# Patient Record
Sex: Female | Born: 1940 | Race: White | Hispanic: No | Smoking: Former smoker
Health system: Southern US, Community
[De-identification: ages and names within clinical notes are randomized; demographics above are authoritative.]

## PROBLEM LIST (undated history)

## (undated) DIAGNOSIS — K760 Fatty (change of) liver, not elsewhere classified: Secondary | ICD-10-CM

## (undated) DIAGNOSIS — R0602 Shortness of breath: Secondary | ICD-10-CM

## (undated) DIAGNOSIS — I509 Heart failure, unspecified: Secondary | ICD-10-CM

## (undated) DIAGNOSIS — M4800 Spinal stenosis, site unspecified: Secondary | ICD-10-CM

## (undated) DIAGNOSIS — M545 Low back pain, unspecified: Secondary | ICD-10-CM

## (undated) DIAGNOSIS — J449 Chronic obstructive pulmonary disease, unspecified: Secondary | ICD-10-CM

## (undated) DIAGNOSIS — G7241 Inclusion body myositis [IBM]: Secondary | ICD-10-CM

## (undated) DIAGNOSIS — F17200 Nicotine dependence, unspecified, uncomplicated: Secondary | ICD-10-CM

## (undated) DIAGNOSIS — R479 Unspecified speech disturbances: Secondary | ICD-10-CM

## (undated) DIAGNOSIS — I5181 Takotsubo syndrome: Secondary | ICD-10-CM

## (undated) DIAGNOSIS — K589 Irritable bowel syndrome without diarrhea: Secondary | ICD-10-CM

## (undated) DIAGNOSIS — N3090 Cystitis, unspecified without hematuria: Secondary | ICD-10-CM

## (undated) DIAGNOSIS — M069 Rheumatoid arthritis, unspecified: Secondary | ICD-10-CM

## (undated) DIAGNOSIS — D369 Benign neoplasm, unspecified site: Secondary | ICD-10-CM

## (undated) DIAGNOSIS — M25511 Pain in right shoulder: Secondary | ICD-10-CM

## (undated) DIAGNOSIS — I2699 Other pulmonary embolism without acute cor pulmonale: Secondary | ICD-10-CM

## (undated) DIAGNOSIS — Q2730 Arteriovenous malformation, site unspecified: Secondary | ICD-10-CM

## (undated) DIAGNOSIS — M79609 Pain in unspecified limb: Secondary | ICD-10-CM

## (undated) DIAGNOSIS — E785 Hyperlipidemia, unspecified: Secondary | ICD-10-CM

## (undated) DIAGNOSIS — K449 Diaphragmatic hernia without obstruction or gangrene: Secondary | ICD-10-CM

## (undated) DIAGNOSIS — R161 Splenomegaly, not elsewhere classified: Secondary | ICD-10-CM

## (undated) DIAGNOSIS — I739 Peripheral vascular disease, unspecified: Secondary | ICD-10-CM

## (undated) DIAGNOSIS — N329 Bladder disorder, unspecified: Secondary | ICD-10-CM

## (undated) DIAGNOSIS — N390 Urinary tract infection, site not specified: Secondary | ICD-10-CM

## (undated) DIAGNOSIS — F329 Major depressive disorder, single episode, unspecified: Secondary | ICD-10-CM

## (undated) DIAGNOSIS — R748 Abnormal levels of other serum enzymes: Secondary | ICD-10-CM

## (undated) DIAGNOSIS — J189 Pneumonia, unspecified organism: Secondary | ICD-10-CM

## (undated) DIAGNOSIS — I10 Essential (primary) hypertension: Secondary | ICD-10-CM

## (undated) DIAGNOSIS — I779 Disorder of arteries and arterioles, unspecified: Secondary | ICD-10-CM

## (undated) DIAGNOSIS — H409 Unspecified glaucoma: Secondary | ICD-10-CM

## (undated) DIAGNOSIS — K623 Rectal prolapse: Secondary | ICD-10-CM

## (undated) DIAGNOSIS — R1310 Dysphagia, unspecified: Secondary | ICD-10-CM

## (undated) DIAGNOSIS — M1990 Unspecified osteoarthritis, unspecified site: Secondary | ICD-10-CM

## (undated) DIAGNOSIS — I2510 Atherosclerotic heart disease of native coronary artery without angina pectoris: Secondary | ICD-10-CM

## (undated) DIAGNOSIS — F32A Depression, unspecified: Secondary | ICD-10-CM

## (undated) DIAGNOSIS — K219 Gastro-esophageal reflux disease without esophagitis: Secondary | ICD-10-CM

## (undated) HISTORY — DX: Diaphragmatic hernia without obstruction or gangrene: K44.9

## (undated) HISTORY — PX: OTHER SURGICAL HISTORY: SHX169

## (undated) HISTORY — DX: Inclusion body myositis (IBM): G72.41

## (undated) HISTORY — DX: Low back pain: M54.5

## (undated) HISTORY — DX: Irritable bowel syndrome, unspecified: K58.9

## (undated) HISTORY — DX: Unspecified speech disturbances: R47.9

## (undated) HISTORY — DX: Fatty (change of) liver, not elsewhere classified: K76.0

## (undated) HISTORY — PX: COLONOSCOPY: SHX174

## (undated) HISTORY — DX: Peripheral vascular disease, unspecified: I73.9

## (undated) HISTORY — DX: Depression, unspecified: F32.A

## (undated) HISTORY — DX: Benign neoplasm, unspecified site: D36.9

## (undated) HISTORY — DX: Major depressive disorder, single episode, unspecified: F32.9

## (undated) HISTORY — DX: Unspecified glaucoma: H40.9

## (undated) HISTORY — DX: Splenomegaly, not elsewhere classified: R16.1

## (undated) HISTORY — DX: Cystitis, unspecified without hematuria: N30.90

## (undated) HISTORY — DX: Low back pain, unspecified: M54.50

## (undated) HISTORY — DX: Arteriovenous malformation, site unspecified: Q27.30

## (undated) HISTORY — DX: Hyperlipidemia, unspecified: E78.5

## (undated) HISTORY — DX: Rectal prolapse: K62.3

## (undated) HISTORY — DX: Other pulmonary embolism without acute cor pulmonale: I26.99

## (undated) HISTORY — PX: BACK SURGERY: SHX140

## (undated) HISTORY — DX: Spinal stenosis, site unspecified: M48.00

## (undated) HISTORY — PX: RECTOCELE REPAIR: SHX761

## (undated) HISTORY — PX: CHOLECYSTECTOMY: SHX55

## (undated) HISTORY — DX: Urinary tract infection, site not specified: N39.0

## (undated) HISTORY — DX: Pain in right shoulder: M25.511

## (undated) HISTORY — PX: LAMINECTOMY: SHX219

## (undated) HISTORY — PX: VAGINAL HYSTERECTOMY: SUR661

## (undated) HISTORY — DX: Essential (primary) hypertension: I10

## (undated) HISTORY — DX: Gastro-esophageal reflux disease without esophagitis: K21.9

## (undated) HISTORY — DX: Atherosclerotic heart disease of native coronary artery without angina pectoris: I25.10

## (undated) HISTORY — PX: APPENDECTOMY: SHX54

## (undated) HISTORY — DX: Abnormal levels of other serum enzymes: R74.8

## (undated) HISTORY — PX: BLADDER SURGERY: SHX569

## (undated) HISTORY — DX: Shortness of breath: R06.02

## (undated) HISTORY — DX: Unspecified osteoarthritis, unspecified site: M19.90

## (undated) HISTORY — DX: Bladder disorder, unspecified: N32.9

## (undated) HISTORY — DX: Chronic obstructive pulmonary disease, unspecified: J44.9

## (undated) HISTORY — PX: EYE SURGERY: SHX253

## (undated) HISTORY — DX: Nicotine dependence, unspecified, uncomplicated: F17.200

## (undated) HISTORY — DX: Rheumatoid arthritis, unspecified: M06.9

## (undated) HISTORY — DX: Disorder of arteries and arterioles, unspecified: I77.9

## (undated) HISTORY — DX: Pain in unspecified limb: M79.609

---

## 1985-05-19 ENCOUNTER — Encounter: Payer: Self-pay | Admitting: Gastroenterology

## 1990-09-08 ENCOUNTER — Encounter: Payer: Self-pay | Admitting: Gastroenterology

## 1999-03-30 ENCOUNTER — Emergency Department (HOSPITAL_COMMUNITY): Admission: EM | Admit: 1999-03-30 | Discharge: 1999-03-30 | Payer: Self-pay

## 1999-04-24 ENCOUNTER — Encounter: Payer: Self-pay | Admitting: Emergency Medicine

## 1999-04-24 ENCOUNTER — Inpatient Hospital Stay (HOSPITAL_COMMUNITY): Admission: EM | Admit: 1999-04-24 | Discharge: 1999-04-25 | Payer: Self-pay | Admitting: Emergency Medicine

## 1999-04-25 ENCOUNTER — Encounter: Payer: Self-pay | Admitting: Internal Medicine

## 1999-04-29 ENCOUNTER — Encounter: Payer: Self-pay | Admitting: Emergency Medicine

## 1999-04-29 ENCOUNTER — Emergency Department (HOSPITAL_COMMUNITY): Admission: EM | Admit: 1999-04-29 | Discharge: 1999-04-29 | Payer: Self-pay | Admitting: Emergency Medicine

## 1999-05-02 ENCOUNTER — Encounter: Payer: Self-pay | Admitting: Emergency Medicine

## 1999-05-02 ENCOUNTER — Ambulatory Visit (HOSPITAL_COMMUNITY): Admission: RE | Admit: 1999-05-02 | Discharge: 1999-05-02 | Payer: Self-pay | Admitting: Emergency Medicine

## 1999-05-05 ENCOUNTER — Ambulatory Visit (HOSPITAL_COMMUNITY): Admission: RE | Admit: 1999-05-05 | Discharge: 1999-05-06 | Payer: Self-pay

## 1999-08-24 ENCOUNTER — Emergency Department (HOSPITAL_COMMUNITY): Admission: EM | Admit: 1999-08-24 | Discharge: 1999-08-24 | Payer: Self-pay | Admitting: Emergency Medicine

## 1999-08-24 ENCOUNTER — Encounter: Payer: Self-pay | Admitting: Emergency Medicine

## 2000-06-27 ENCOUNTER — Encounter: Payer: Self-pay | Admitting: Emergency Medicine

## 2000-06-27 ENCOUNTER — Emergency Department (HOSPITAL_COMMUNITY): Admission: EM | Admit: 2000-06-27 | Discharge: 2000-06-27 | Payer: Self-pay | Admitting: Emergency Medicine

## 2000-10-30 ENCOUNTER — Encounter: Payer: Self-pay | Admitting: Interventional Cardiology

## 2000-10-30 ENCOUNTER — Ambulatory Visit (HOSPITAL_COMMUNITY): Admission: RE | Admit: 2000-10-30 | Discharge: 2000-10-30 | Payer: Self-pay | Admitting: Interventional Cardiology

## 2003-12-08 ENCOUNTER — Ambulatory Visit (HOSPITAL_COMMUNITY): Admission: RE | Admit: 2003-12-08 | Discharge: 2003-12-09 | Payer: Self-pay | Admitting: Neurosurgery

## 2004-08-16 ENCOUNTER — Ambulatory Visit: Payer: Self-pay | Admitting: Internal Medicine

## 2004-08-16 ENCOUNTER — Ambulatory Visit (HOSPITAL_COMMUNITY): Admission: RE | Admit: 2004-08-16 | Discharge: 2004-08-16 | Payer: Self-pay | Admitting: Internal Medicine

## 2004-09-06 ENCOUNTER — Ambulatory Visit: Payer: Self-pay | Admitting: Internal Medicine

## 2004-09-21 ENCOUNTER — Ambulatory Visit: Payer: Self-pay | Admitting: Gastroenterology

## 2007-07-14 ENCOUNTER — Telehealth: Payer: Self-pay | Admitting: Internal Medicine

## 2007-07-17 ENCOUNTER — Ambulatory Visit: Payer: Self-pay | Admitting: Internal Medicine

## 2007-09-16 ENCOUNTER — Encounter: Payer: Self-pay | Admitting: Internal Medicine

## 2008-01-14 ENCOUNTER — Ambulatory Visit: Payer: Self-pay | Admitting: Internal Medicine

## 2008-01-15 LAB — CONVERTED CEMR LAB
ALT: 15 units/L (ref 0–35)
Alkaline Phosphatase: 88 units/L (ref 39–117)
Basophils Absolute: 0 10*3/uL (ref 0.0–0.1)
Bilirubin Urine: NEGATIVE
Bilirubin, Direct: 0.2 mg/dL (ref 0.0–0.3)
CO2: 29 meq/L (ref 19–32)
Chloride: 111 meq/L (ref 96–112)
Glucose, Bld: 97 mg/dL (ref 70–99)
Hemoglobin: 13.4 g/dL (ref 12.0–15.0)
Leukocytes, UA: NEGATIVE
Lymphocytes Relative: 18 % (ref 12.0–46.0)
MCHC: 35.6 g/dL (ref 30.0–36.0)
Monocytes Relative: 2.1 % — ABNORMAL LOW (ref 3.0–12.0)
Neutro Abs: 6 10*3/uL (ref 1.4–7.7)
Neutrophils Relative %: 74.6 % (ref 43.0–77.0)
Nitrite: NEGATIVE
Platelets: 301 10*3/uL (ref 150–400)
Potassium: 4.9 meq/L (ref 3.5–5.1)
RDW: 12.6 % (ref 11.5–14.6)
Sodium: 133 meq/L — ABNORMAL LOW (ref 135–145)
Total Bilirubin: 1 mg/dL (ref 0.3–1.2)
Total CHOL/HDL Ratio: 7.8
Total Protein: 6.8 g/dL (ref 6.0–8.3)
Triglycerides: 247 mg/dL (ref 0–149)
Urobilinogen, UA: 0.2 (ref 0.0–1.0)
VLDL: 49 mg/dL — ABNORMAL HIGH (ref 0–40)
pH: 6 (ref 5.0–8.0)

## 2008-01-16 ENCOUNTER — Ambulatory Visit: Payer: Self-pay

## 2008-02-09 DIAGNOSIS — K219 Gastro-esophageal reflux disease without esophagitis: Secondary | ICD-10-CM | POA: Insufficient documentation

## 2008-02-09 DIAGNOSIS — K589 Irritable bowel syndrome without diarrhea: Secondary | ICD-10-CM

## 2008-02-09 DIAGNOSIS — K449 Diaphragmatic hernia without obstruction or gangrene: Secondary | ICD-10-CM | POA: Insufficient documentation

## 2008-02-10 ENCOUNTER — Ambulatory Visit: Payer: Self-pay | Admitting: Gastroenterology

## 2008-02-10 DIAGNOSIS — R159 Full incontinence of feces: Secondary | ICD-10-CM | POA: Insufficient documentation

## 2008-02-10 DIAGNOSIS — N329 Bladder disorder, unspecified: Secondary | ICD-10-CM | POA: Insufficient documentation

## 2008-02-10 DIAGNOSIS — K623 Rectal prolapse: Secondary | ICD-10-CM | POA: Insufficient documentation

## 2008-02-11 ENCOUNTER — Encounter: Payer: Self-pay | Admitting: Gastroenterology

## 2008-02-11 ENCOUNTER — Ambulatory Visit: Payer: Self-pay | Admitting: Gastroenterology

## 2008-02-11 DIAGNOSIS — D369 Benign neoplasm, unspecified site: Secondary | ICD-10-CM

## 2008-02-11 HISTORY — DX: Benign neoplasm, unspecified site: D36.9

## 2008-02-15 ENCOUNTER — Encounter: Payer: Self-pay | Admitting: Gastroenterology

## 2008-02-19 ENCOUNTER — Telehealth: Payer: Self-pay | Admitting: Internal Medicine

## 2008-02-23 ENCOUNTER — Encounter: Payer: Self-pay | Admitting: Internal Medicine

## 2008-02-23 ENCOUNTER — Ambulatory Visit: Payer: Self-pay | Admitting: Internal Medicine

## 2008-02-23 ENCOUNTER — Encounter (INDEPENDENT_AMBULATORY_CARE_PROVIDER_SITE_OTHER): Payer: Self-pay | Admitting: *Deleted

## 2008-03-01 ENCOUNTER — Telehealth: Payer: Self-pay | Admitting: Internal Medicine

## 2008-03-04 ENCOUNTER — Telehealth: Payer: Self-pay | Admitting: Internal Medicine

## 2008-03-05 ENCOUNTER — Ambulatory Visit: Payer: Self-pay

## 2008-03-05 ENCOUNTER — Encounter: Payer: Self-pay | Admitting: Internal Medicine

## 2008-03-10 ENCOUNTER — Ambulatory Visit: Payer: Self-pay | Admitting: Internal Medicine

## 2008-03-15 ENCOUNTER — Encounter: Payer: Self-pay | Admitting: Internal Medicine

## 2008-03-15 ENCOUNTER — Ambulatory Visit: Payer: Self-pay

## 2008-03-26 ENCOUNTER — Telehealth: Payer: Self-pay | Admitting: Internal Medicine

## 2008-03-29 ENCOUNTER — Telehealth: Payer: Self-pay | Admitting: Internal Medicine

## 2008-03-30 ENCOUNTER — Ambulatory Visit: Payer: Self-pay | Admitting: Internal Medicine

## 2008-03-30 LAB — CONVERTED CEMR LAB
Creatinine, Ser: 0.9 mg/dL (ref 0.4–1.2)
Direct LDL: 146.8 mg/dL
HDL: 30.6 mg/dL — ABNORMAL LOW (ref >39.0)
Total CHOL/HDL Ratio: 7.6
VLDL: 58 mg/dL — ABNORMAL HIGH (ref 0–40)

## 2008-03-31 ENCOUNTER — Encounter: Admission: RE | Admit: 2008-03-31 | Discharge: 2008-03-31 | Payer: Self-pay | Admitting: Internal Medicine

## 2008-03-31 ENCOUNTER — Encounter: Payer: Self-pay | Admitting: Internal Medicine

## 2008-04-06 ENCOUNTER — Telehealth: Payer: Self-pay | Admitting: Internal Medicine

## 2008-04-13 ENCOUNTER — Encounter: Admission: RE | Admit: 2008-04-13 | Discharge: 2008-04-13 | Payer: Self-pay | Admitting: Orthopedic Surgery

## 2008-04-22 ENCOUNTER — Telehealth: Payer: Self-pay | Admitting: Internal Medicine

## 2008-04-23 ENCOUNTER — Ambulatory Visit: Payer: Self-pay | Admitting: Internal Medicine

## 2008-04-27 ENCOUNTER — Telehealth: Payer: Self-pay | Admitting: Internal Medicine

## 2008-05-16 ENCOUNTER — Inpatient Hospital Stay (HOSPITAL_COMMUNITY): Admission: RE | Admit: 2008-05-16 | Discharge: 2008-05-17 | Payer: Self-pay | Admitting: Orthopedic Surgery

## 2008-07-05 ENCOUNTER — Ambulatory Visit (HOSPITAL_COMMUNITY): Admission: RE | Admit: 2008-07-05 | Discharge: 2008-07-05 | Payer: Self-pay | Admitting: Orthopedic Surgery

## 2008-09-15 ENCOUNTER — Inpatient Hospital Stay (HOSPITAL_COMMUNITY): Admission: RE | Admit: 2008-09-15 | Discharge: 2008-09-16 | Payer: Self-pay | Admitting: Obstetrics and Gynecology

## 2008-09-21 ENCOUNTER — Telehealth (INDEPENDENT_AMBULATORY_CARE_PROVIDER_SITE_OTHER): Payer: Self-pay | Admitting: *Deleted

## 2008-09-29 ENCOUNTER — Encounter: Admission: RE | Admit: 2008-09-29 | Discharge: 2008-09-29 | Payer: Self-pay | Admitting: Orthopedic Surgery

## 2008-11-16 DIAGNOSIS — Z9089 Acquired absence of other organs: Secondary | ICD-10-CM

## 2008-12-20 ENCOUNTER — Encounter (INDEPENDENT_AMBULATORY_CARE_PROVIDER_SITE_OTHER): Payer: Self-pay | Admitting: *Deleted

## 2008-12-20 ENCOUNTER — Ambulatory Visit: Payer: Self-pay | Admitting: Cardiology

## 2008-12-23 ENCOUNTER — Telehealth (INDEPENDENT_AMBULATORY_CARE_PROVIDER_SITE_OTHER): Payer: Self-pay

## 2008-12-27 ENCOUNTER — Ambulatory Visit: Payer: Self-pay

## 2008-12-27 ENCOUNTER — Encounter: Payer: Self-pay | Admitting: Cardiology

## 2008-12-31 ENCOUNTER — Encounter: Payer: Self-pay | Admitting: Cardiology

## 2008-12-31 ENCOUNTER — Ambulatory Visit: Payer: Self-pay

## 2009-01-04 ENCOUNTER — Telehealth: Payer: Self-pay | Admitting: Cardiology

## 2009-01-12 ENCOUNTER — Telehealth: Payer: Self-pay | Admitting: Cardiology

## 2009-01-14 ENCOUNTER — Ambulatory Visit: Payer: Self-pay | Admitting: Cardiology

## 2009-02-01 ENCOUNTER — Encounter (INDEPENDENT_AMBULATORY_CARE_PROVIDER_SITE_OTHER): Payer: Self-pay | Admitting: *Deleted

## 2009-03-01 ENCOUNTER — Ambulatory Visit: Payer: Self-pay

## 2009-03-01 ENCOUNTER — Encounter: Payer: Self-pay | Admitting: Internal Medicine

## 2009-04-05 ENCOUNTER — Ambulatory Visit: Payer: Self-pay | Admitting: Cardiology

## 2009-06-21 ENCOUNTER — Ambulatory Visit: Payer: Self-pay | Admitting: Cardiology

## 2009-06-24 LAB — CONVERTED CEMR LAB
BUN: 14 mg/dL (ref 6–23)
CO2: 29 meq/L (ref 19–32)
Calcium: 9.5 mg/dL (ref 8.4–10.5)
Chloride: 101 meq/L (ref 96–112)
Creatinine, Ser: 0.9 mg/dL (ref 0.4–1.2)

## 2009-08-18 ENCOUNTER — Encounter: Admission: RE | Admit: 2009-08-18 | Discharge: 2009-08-18 | Payer: Self-pay | Admitting: Family Medicine

## 2009-10-27 ENCOUNTER — Encounter (INDEPENDENT_AMBULATORY_CARE_PROVIDER_SITE_OTHER): Payer: Self-pay | Admitting: *Deleted

## 2009-11-20 ENCOUNTER — Inpatient Hospital Stay (HOSPITAL_COMMUNITY): Admission: EM | Admit: 2009-11-20 | Discharge: 2009-11-29 | Payer: Self-pay | Admitting: Emergency Medicine

## 2009-11-20 ENCOUNTER — Ambulatory Visit: Payer: Self-pay | Admitting: Cardiology

## 2009-11-22 ENCOUNTER — Encounter (INDEPENDENT_AMBULATORY_CARE_PROVIDER_SITE_OTHER): Payer: Self-pay | Admitting: Internal Medicine

## 2009-11-23 ENCOUNTER — Encounter (INDEPENDENT_AMBULATORY_CARE_PROVIDER_SITE_OTHER): Payer: Self-pay | Admitting: Internal Medicine

## 2010-01-05 ENCOUNTER — Encounter: Payer: Self-pay | Admitting: Cardiology

## 2010-01-06 ENCOUNTER — Ambulatory Visit: Payer: Self-pay | Admitting: Cardiology

## 2010-04-13 HISTORY — PX: OTHER SURGICAL HISTORY: SHX169

## 2010-04-21 ENCOUNTER — Encounter: Payer: Self-pay | Admitting: Gastroenterology

## 2010-04-28 ENCOUNTER — Telehealth: Payer: Self-pay | Admitting: Cardiology

## 2010-05-05 ENCOUNTER — Encounter: Payer: Self-pay | Admitting: Gastroenterology

## 2010-05-10 ENCOUNTER — Encounter: Payer: Self-pay | Admitting: Emergency Medicine

## 2010-05-18 ENCOUNTER — Encounter: Payer: Self-pay | Admitting: Emergency Medicine

## 2010-05-25 ENCOUNTER — Telehealth: Payer: Self-pay | Admitting: Cardiology

## 2010-06-22 ENCOUNTER — Encounter: Payer: Self-pay | Admitting: Emergency Medicine

## 2010-06-30 ENCOUNTER — Ambulatory Visit: Payer: Self-pay | Admitting: Emergency Medicine

## 2010-06-30 LAB — CONVERTED CEMR LAB
Basophils Relative: 0.7 % (ref 0.0–3.0)
Eosinophils Absolute: 0.3 10*3/uL (ref 0.0–0.7)
Lymphocytes Relative: 21.2 % (ref 12.0–46.0)
MCHC: 34.8 g/dL (ref 30.0–36.0)
MCV: 91.8 fL (ref 78.0–100.0)
Monocytes Absolute: 0.9 10*3/uL (ref 0.1–1.0)
Neutrophils Relative %: 69.4 % (ref 43.0–77.0)
Platelets: 313 10*3/uL (ref 150.0–400.0)
RBC: 4.1 M/uL (ref 3.87–5.11)
WBC: 13.1 10*3/uL — ABNORMAL HIGH (ref 4.5–10.5)

## 2010-07-18 ENCOUNTER — Telehealth: Payer: Self-pay | Admitting: Gastroenterology

## 2010-07-19 ENCOUNTER — Ambulatory Visit: Payer: Self-pay | Admitting: Gastroenterology

## 2010-07-24 ENCOUNTER — Ambulatory Visit: Payer: Self-pay | Admitting: Internal Medicine

## 2010-07-24 ENCOUNTER — Encounter: Payer: Self-pay | Admitting: Gastroenterology

## 2010-07-24 ENCOUNTER — Ambulatory Visit: Payer: Self-pay | Admitting: Emergency Medicine

## 2010-07-24 DIAGNOSIS — R1012 Left upper quadrant pain: Secondary | ICD-10-CM

## 2010-07-24 DIAGNOSIS — Z860100 Personal history of colon polyps, unspecified: Secondary | ICD-10-CM | POA: Insufficient documentation

## 2010-07-24 DIAGNOSIS — M1990 Unspecified osteoarthritis, unspecified site: Secondary | ICD-10-CM | POA: Insufficient documentation

## 2010-07-24 DIAGNOSIS — K5520 Angiodysplasia of colon without hemorrhage: Secondary | ICD-10-CM | POA: Insufficient documentation

## 2010-07-24 DIAGNOSIS — K648 Other hemorrhoids: Secondary | ICD-10-CM | POA: Insufficient documentation

## 2010-07-24 LAB — CONVERTED CEMR LAB
ALT: 17 units/L (ref 0–35)
AST: 17 units/L (ref 0–37)
Albumin: 4 g/dL (ref 3.5–5.2)
Alkaline Phosphatase: 75 units/L (ref 39–117)
Calcium: 9.3 mg/dL (ref 8.4–10.5)
Chloride: 100 meq/L (ref 96–112)
Potassium: 4.2 meq/L (ref 3.5–5.1)
Sodium: 140 meq/L (ref 135–145)
Total Protein: 6.6 g/dL (ref 6.0–8.3)

## 2010-07-25 ENCOUNTER — Telehealth: Payer: Self-pay | Admitting: Emergency Medicine

## 2010-07-26 ENCOUNTER — Ambulatory Visit: Payer: Self-pay | Admitting: Cardiology

## 2010-08-18 ENCOUNTER — Encounter: Payer: Self-pay | Admitting: Emergency Medicine

## 2010-08-23 ENCOUNTER — Ambulatory Visit
Admission: RE | Admit: 2010-08-23 | Discharge: 2010-08-23 | Payer: Self-pay | Source: Home / Self Care | Attending: Emergency Medicine | Admitting: Emergency Medicine

## 2010-08-23 ENCOUNTER — Encounter: Payer: Self-pay | Admitting: Emergency Medicine

## 2010-08-30 ENCOUNTER — Encounter
Admission: RE | Admit: 2010-08-30 | Discharge: 2010-08-30 | Payer: Self-pay | Source: Home / Self Care | Attending: Family Medicine | Admitting: Family Medicine

## 2010-08-30 ENCOUNTER — Encounter (INDEPENDENT_AMBULATORY_CARE_PROVIDER_SITE_OTHER): Payer: Self-pay | Admitting: *Deleted

## 2010-08-31 ENCOUNTER — Ambulatory Visit
Admission: RE | Admit: 2010-08-31 | Discharge: 2010-08-31 | Payer: Self-pay | Source: Home / Self Care | Attending: Gastroenterology | Admitting: Gastroenterology

## 2010-09-03 ENCOUNTER — Encounter: Payer: Self-pay | Admitting: Orthopedic Surgery

## 2010-09-04 ENCOUNTER — Encounter: Payer: Self-pay | Admitting: Cardiovascular Disease

## 2010-09-05 ENCOUNTER — Encounter: Payer: Self-pay | Admitting: Cardiovascular Disease

## 2010-09-06 ENCOUNTER — Ambulatory Visit: Admission: RE | Admit: 2010-09-06 | Discharge: 2010-09-06 | Payer: Self-pay | Source: Home / Self Care

## 2010-09-06 ENCOUNTER — Encounter: Payer: Self-pay | Admitting: Cardiovascular Disease

## 2010-09-12 NOTE — Letter (Signed)
Summary: Appointment - Reminder 2  [employer], Main Office  [address]   [place], [place] [address]   Phone: [phone]  Fax: [phone]     October 27, 2009 MRN: [id number]   [facility] [address] [place], [place]  [address]   Dear Ms. Robinson,  Our records indicate that it is time to schedule a follow-up appointment with Dr. Myrtis Ser. It is very important that we reach you to schedule this appointment. We look forward to participating in your health care needs. Please contact us at the number listed above at your earliest convenience to schedule your appointment.  If you are unable to make an appointment at this time, give [place] a call so we can update our records.     Sincerely,   Migdalia Dk [facility] Scheduling Team

## 2010-09-12 NOTE — Progress Notes (Signed)
Summary: Triage  Phone Note Call from Patient Call back at Home Phone [phone]   Caller: Patient Call For: Dr. Russella Dar Reason for Call: Talk to Nurse Summary of Call: Pt is having stabbing pain in top of stomach, the pain is unbearable and she just does not think she can wait untill january to be seen Initial call taken by: [place] Johnson,  July 18, 2010 12:06 PM  Follow-up for Phone Call        Spoke with patient and gave her an apt with Dr. Russella Dar for 07/19/10 @10am . Patient aware of apt date and time. Selinda Michaels RN  July 18, 2010 1:24 PM

## 2010-09-12 NOTE — Assessment & Plan Note (Signed)
Summary: dyspnea   Visit Type:  Initial Consult Primary Provider/Referring Provider:  Laurann Montana, MD  CC:  Pulmonary Consult, patients main complaint is sob with exertion, worse over the last 3 months, and denies cough or fever .  History of Present Illness: 70 yo former smoker, hx carotid disease, RA diagnosed by Dr Nickola Major in June '11, started on MTX in 7/11.  No evidence CAD by myoview in 5/10. Also hx PE in 1988 after a bladder surgery, anti-coag x 57mo.  Referred by Dr Cliffton Asters to evaluate SOB. She tells me that her breathing had some minor limitations up to 2 yrs ago. This improved some when she was treated for Vit D and B12 deficiency. Then became much more bothersome this Summer when she started Prednisone, followed by MTX for her RA. She has gained about 30 lbs since the Summer. She believes that the SOB relates to the initiation of these meds. Workup has included spirometry, not available at this time. No imaging yet. She was started on Spiriva a month ago - she can't tell any difference in her breathing.   Denies cough, wheeze, phlegm. No edema or CP. See other ROS below.   Preventive Screening-Counseling & Management  Alcohol-Tobacco     Smoking Cessation Counseling: yes     Smoke Cessation Stage: quit     Packs/Day: 1.0     Year Started: 1966     Year Quit: 2009     Pack years: 51     Tobacco Counseling: to remain off tobacco products  Current Medications (verified): 1)  Vitamin D 57846 Unit  Caps (Ergocalciferol) .... Once Per Week 2)  Aspirin 81 Mg  Tbec (Aspirin) .... One By Mouth Every Day 3)  Citalopram Hydrobromide 40 Mg Tabs (Citalopram Hydrobromide) .... Take One Tablet By Mouth Once Daily. 4)  Nitroglycerin 0.4 Mg Subl (Nitroglycerin) .... One Tablet Under Tongue Every 5 Minutes As Needed For Chest Pain---May Repeat Times Three 5)  Vitamin B-12 Cr 2000 Mcg Cr-Tabs (Cyanocobalamin) .... Once Daily 6)  Furosemide 20 Mg Tabs (Furosemide) .... Take One Tablet By Mouth  Once Daily 7)  Potassium Chloride Cr 10 Meq Cr-Caps (Potassium Chloride) .... Take One Tablet By Mouth Every Other Day With The  Furosemide 8)  Docusate Sodium 100 Mg Caps (Docusate Sodium) .... As Needed 9)  Tylenol 8 Hour 650 Mg Cr-Tabs (Acetaminophen) .... As Needed 10)  Vitamin C 500 Mg  Tabs (Ascorbic Acid) .... Once Daily 11)  Methotrexate 2.5 Mg Tabs (Methotrexate Sodium) .... 8 Tablets Once A Week 12)  Protonix 40 Mg Solr (Pantoprazole Sodium) .Marland Kitchen.. 1 Two Times A Day 13)  Spiriva Handihaler 18 Mcg Caps (Tiotropium Bromide Monohydrate) .Marland Kitchen.. 1 Capsule Once Daily  Allergies (verified): 1)  ! Macrobid (Nitrofurantoin Monohyd Macro) 2)  ! Cipro 3)  ! Levaquin  Social History: Single widow Patient currently smokes.  Alcohol Use - no Retired from [employer] Packs/Day:  1.0 Pack years:  45  Review of Systems       The patient complains of shortness of breath with activity, acid heartburn, weight change, abdominal pain, headaches, anxiety, hand/feet swelling, and joint stiffness or pain.  The patient denies shortness of breath at rest, productive cough, non-productive cough, coughing up blood, chest pain, irregular heartbeats, indigestion, loss of appetite, difficulty swallowing, sore throat, tooth/dental problems, nasal congestion/difficulty breathing through nose, sneezing, itching, ear ache, depression, rash, change in color of mucus, and fever.    Vital Signs:  Patient profile:  71 year old female Height:      64 inches Weight:      181.8 pounds BMI:     31.32 O2 Sat:      98 % on Room air Temp:     98.6 degrees F oral Pulse rate:   77 / minute BP sitting:   150 / 88  (left arm)  Vitals Entered By: Renold Genta RCP, LPN (June 30, 2010 9:23 AM)  Nutrition Counseling: Patient's BMI is greater than 25 and therefore counseled on weight management options.  O2 Flow:  Room air  Serial Vital Signs/Assessments:  Comments: Ambulatory Pulse  Oximetry  Resting; HR__82___    02 Sat_98%RA____  Lap1 (185 feet)   HR_94____   02 Sat_96%RA____ Lap2 (185 feet)   HR__98___   02 Sat_96%RA____    Lap3 (185 feet)   HR__97___   02 Sat__97%RA___  _X__Test Completed without Difficulty ___Test Stopped due to: Renold Genta RCP, LPN  June 30, 2010 11:41 AM   By: Renold Genta RCP, LPN   CC: Pulmonary Consult, patients main complaint is sob with exertion, worse over the last 3 months, denies cough or fever  Comments Medications reviewed with patient Renold Genta RCP, LPN  June 30, 2010 9:34 AM    Physical Exam  General:  normal appearance, healthy appearing, and obese.   Head:  normocephalic and atraumatic Eyes:  conjunctiva and sclera clear Nose:  no deformity, discharge, inflammation, or lesions Mouth:  no deformity or lesions Neck:  no masses, thyromegaly, or abnormal cervical nodes Lungs:  clear bilaterally to auscultation Heart:  regular rate and rhythm, S1, S2 without murmurs, rubs, gallops, or clicks Abdomen:  obese, soft benign Extremities:  no clubbing, cyanosis, edema, or deformity noted Neurologic:  non-focal Skin:  intact without lesions or rashes Psych:  alert and cooperative; normal mood and affect; normal attention span and concentration   Impression & Recommendations:  Problem # 1:  DYSPNEA ON EXERTION (ICD-786.09)  Etiology not yet clear - certainly DDx includes COPD, but also need to consider possible interstitial disease due to RA or MTX. Nothing on exam to support this. ? whether MTX causing more global effects like weakness or anemia - she has been symptomatic with Vit D and B12 deficiencies in the past. Nothing to support recurrent PE, although this is a lesser consideration.  - CXR now to look for ILD  - CBC to eval anemia - walking oximetry - full PFT, obtain her spiro already done - ROV next available  Orders: Est. Patient Level IV (04540) TLB-CBC Platelet - w/Differential  (85025-CBCD) T-2 View CXR (71020TC)  Medications Added to Medication List This Visit: 1)  Methotrexate 2.5 Mg Tabs (Methotrexate sodium) .... 8 tablets once a week 2)  Protonix 40 Mg Solr (Pantoprazole sodium) .Marland Kitchen.. 1 two times a day 3)  Spiriva Handihaler 18 Mcg Caps (Tiotropium bromide monohydrate) .Marland Kitchen.. 1 capsule once daily  Patient Instructions: 1)  We will perform a CXR 2)  We will obtain full PFT's at your next office visit 3)  Walking oximetry today  4)  Blood work today 5)  Follow up with Dr Delton Coombes next available appointment.  6)  Stop Spiriva for now.

## 2010-09-12 NOTE — Progress Notes (Signed)
Summary: c/o sob  Phone Note Call from Patient Call back at [facility] Phone [phone] Call back at out of home 2:15-3p.m.    Caller: Patient Reason for Call: Talk to Nurse Complaint: Breathing Problems Details for Reason: c/o sob. pt has appt on 10/18 Initial call taken by: Lorne Skeens,  May 25, 2010 11:34 AM  Follow-up for Phone Call        spoke w/pt we had increased her lasix to once daily and she needed a new prescription, she can tell it has helped, she does have an appt on tue if gets worse before then will let us know Meredith Staggers, RN  May 25, 2010 5:07 PM     New/Updated Medications: FUROSEMIDE 20 MG TABS (FUROSEMIDE) Take one tablet by mouth once daily Prescriptions: FUROSEMIDE 20 MG TABS (FUROSEMIDE) Take one tablet by mouth once daily  #30 x 6   Entered by:   Meredith Staggers, RN   Authorized by:   Talitha Givens, MD, [facility]   Signed by:   Meredith Staggers, RN on 05/25/2010   Method used:   Electronically to        [facility]  [id number] (retail)       [address]       [place], [place]  [address]       Ph: [phone] or [phone]       Fax: [phone]   RxID:   [id number]

## 2010-09-12 NOTE — Miscellaneous (Signed)
  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 Doppler... July,.. 2010... 40-59% bilaterally.. stable... plan for follow up one year HEMATOCHEZIA (ICD-578.1) RECTAL PAIN (ICD-569.42) LEG PAIN (ICD-729.5) LOW BACK PAIN (ICD-724.2)...hospital admission with back pain in April, 2011 surgical repair of a disc herniation L4-L5 OSTEOARTHRITIS (ICD-715.90) COPD (ICD-496) TOBACCO USE DISORDER/SMOKER-SMOKING CESSATION DISCUSSED (ICD-305.1) History gastric ulcer at age 53 SOB Chest Pain .Marland KitchenMyoview scan.... May, 2010.Marland Kitchen ejection fraction 50%.. no ischemia EF 55-60%.... echo... 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 Doppler... July,.. 2010... 40-59% bilaterally.. stable... plan for follow up one year HEMATOCHEZIA (ICD-578.1) RECTAL PAIN (ICD-569.42) LEG PAIN (ICD-729.5) LOW BACK PAIN (ICD-724.2)...hospital admission with back pain in April, 2011 surgical repair of a disc herniation L4-L5 OSTEOARTHRITIS (ICD-715.90) COPD (ICD-496) TOBACCO USE DISORDER/SMOKER-SMOKING CESSATION DISCUSSED  (ICD-305.1) History gastric ulcer at age 35 SOB Chest Pain .Marland KitchenMyoview scan.... May, 2010.Marland Kitchen ejection fraction 50%.. no ischemia EF 55-60%.... echo... May, 2010

## 2010-09-12 NOTE — Assessment & Plan Note (Signed)
Summary: [name]   Visit Type:  Follow-up Primary Provider:  Laurann Montana, MD  CC:  hand swelling.  History of Present Illness: The patient was seen for cardiology followup.  Recently she was hospitalized and required emergent disc surgery at L4-5.  She was stable with this.  She has not been having any chest pain or significant shortness of breath.  I saw her last November 2 010.  She had some chest pain in the past.  Medically her skin revealed no ischemia and ejection fraction was 55-60% by echo in May, 2010.  The patient notes swelling in her hands.  After further history it is clear that she's had this in the past.  He was treated with prednisone and responded in the past.  The etiology is not clear to me  Current Medications (verified): 1)  Vitamin D [address] Unit  Caps (Ergocalciferol) .... Once Per Week 2)  Aspirin 81 Mg  Tbec (Aspirin) .... One By Mouth Every Day 3)  Citalopram Hydrobromide 40 Mg Tabs (Citalopram Hydrobromide) .... Take One Tablet By Mouth Once Daily. 4)  Nitroglycerin 0.4 Mg Subl (Nitroglycerin) .... One Tablet Under Tongue Every 5 Minutes As Needed For Chest Pain---May Repeat Times Three 5)  Vitamin B-12 Cr 2000 Mcg Cr-Tabs (Cyanocobalamin) .... Once Daily 6)  Furosemide 20 Mg Tabs (Furosemide) .... Take One Tablet By Mouth Every Other Day 7)  Potassium Chloride Cr 10 Meq Cr-Caps (Potassium Chloride) .... Take One Tablet By Mouth Every Other Day With The  Furosemide 8)  Robaxin 500 Mg Tabs (Methocarbamol) .... As Needed 9)  Metoprolol Tartrate 25 Mg Tabs (Metoprolol Tartrate) .... Take One Tablet By Mouth Once Daily 10)  Simvastatin 20 Mg Tabs (Simvastatin) .... Take One Tablet By Mouth Daily At Bedtime 11)  Docusate Sodium 100 Mg Caps (Docusate Sodium) .... As Needed 12)  Tylenol 8 Hour 650 Mg Cr-Tabs (Acetaminophen) .... As Needed 13)  Vitamin C 500 Mg  Tabs (Ascorbic Acid) .... Once Daily  Allergies: 1)  ! Macrobid (Nitrofurantoin Monohyd Macro) 2)  !  Cipro 3)  ! 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 Doppler... July,.. 2010... 40-59% bilaterally.. stable... plan for follow up one year HEMATOCHEZIA (ICD-578.1) RECTAL PAIN (ICD-569.42) LEG PAIN (ICD-729.5) LOW BACK PAIN (ICD-724.2)...hospital admission with back pain in April, 2011 surgical repair of a disc herniation L4-L5 OSTEOARTHRITIS (ICD-715.90) COPD (ICD-496) TOBACCO USE DISORDER/SMOKER-SMOKING CESSATION DISCUSSED (ICD-305.1) History gastric ulcer at age 16 SOB Chest Pain .Marland KitchenMyoview scan.... May, 2010.Marland Kitchen ejection fraction 50%.. no ischemia EF 55-60%.... echo... May, 2010 Hand swelling.... Jan 06, 2010  Review of Systems       Patient denies fever, chills, headache, sweats, rash, change in vision, change in hearing, chest pain, cough, nausea vomiting, urinary symptoms.  She does have some discomfort in her knee.  All of the systems are reviewed and are negative.  Vital Signs:  Patient profile:   70 year old female Height:      64 inches Weight:      162 pounds BMI:     27.91 Pulse rate:   70 / minute BP sitting:   132 / 68  (left arm)  Vitals Entered By: Laurance Flatten CMA (Jan 06, 2010 3:07 PM)  Physical Exam  General:  patient is stable today. Head:  head is atraumatic. Eyes:  no xanthelasma. Neck:  no jugular venous distention. Chest Wall:  no chest wall tenderness. Lungs:  lungs are clear.  Respiratory effort is not labored. Heart:  cardiac exam reveals an S1-S2.  No clicks or significant murmurs. Abdomen:  abdomen is soft. Msk:  no musculoskeletal deformities. Extremities:   there is slight swelling in her right ankle.  There is swelling in both hands.  There may be slight increase in warmth.  There is no redness. Skin:  no skin rashes. Psych:  patient is oriented to person time and place.  Affect is normal.   Impression & Recommendations:  Problem # 1:  * HAND JOINT SWELLING The etiology of the hand swelling is not clear to me.  She says it has been helped with prednisone in the past.  I asked her to see her primary team about this.  I feel that this is not fluid overload from a cardiac problem.I did give her the option to take a small increase in her Lasix dose for 3 days to see if this helps.  I believe this would be safe but I doubt it would treat the hand swelling.  Problem # 2:  SHORTNESS OF BREATH (ICD-786.05) She is not having any significant shortness of breath this time.  Problem # 3:  CHEST PAIN (ICD-786.50) There has been no significant chest pain.  EKG is done.  It is reviewed by me and it is normal.  Problem # 4:  CAROTID BRUIT (ICD-785.9) The patient has known carotid artery disease.  We will be sure that she has followup studies arranged.  Patient Instructions: 1)  You may increase your lasix for 3 days if you wish to see if it helps with swelling. 2)  Your physician wants you to follow-up in:  1 year.  You will receive a reminder letter in the mail two months in advance. If you don't receive a letter, please call our office to schedule the follow-up appointment.

## 2010-09-12 NOTE — Progress Notes (Signed)
Summary: pt is SOB and weak  Phone Note Call from Patient Call back at Home Phone [phone]   Caller: Patient Reason for Call: Talk to Nurse, Talk to Doctor Summary of Call: pt wants to see Dr. Myrtis Ser because she is SOB and weak. I expalin Dr. Myrtis Ser would not be here until the 26th and I would send a message to the nurse Initial call taken by: Omer Jack,  April 28, 2010 3:15 PM  Follow-up for Phone Call        spoke w/pt she states she feels like she is retaining fluid some days are ok and other are really bad, she just feels so SOB, she does have LE edema, she does not wt on a regular basis, she is on prednisone and did have a "breathing test" this week w/pcp which she states was ok.  Pt is on Lasix every other day, she will increase to daily and give me a call early next week to see how she is doing Meredith Staggers, RN  April 28, 2010 5:34 PM      Appended Document: pt is SOB and weak Agree

## 2010-09-12 NOTE — Progress Notes (Signed)
Summary: Triage  Phone Note Call from Patient Call back at Home Phone [phone]   Caller: Patient Call For: Dr.Stark Reason for Call: Talk to Nurse Summary of Call: pt can not come in for appt tomorrow morning but also can not wait for first available...wants to be worked in [employer] Initial call taken by: [place] Johnson,  July 18, 2010 4:26 PM  Follow-up for Phone Call        Left message to call back Dannette Kinkaid RN  July 18, 2010 4:54 PM  Spoke with patient, she states that she had to cancel her other appointment because she wasn't sure about her insurance. Scheduled patient to see Mike Gip, PA 07/24/10 at 11am. Patient aware of appointment date and time. Follow-up by: Selinda Michaels RN,  July 19, 2010 3:51 PM

## 2010-09-13 ENCOUNTER — Encounter: Payer: Self-pay | Admitting: Family Medicine

## 2010-09-14 NOTE — Assessment & Plan Note (Signed)
Summary: Abdominal Pain (Dr. Russella Dar patient)   History of Present Illness Visit Type: Follow-up Visit Primary GI MD: Elie Goody MD [facility] Primary Provider: Laurann Montana, MD Requesting Provider: n/a Chief Complaint: constant left -sided abdominal pain since July History of Present Illness:   PLEASANT 70 YO FEMALE KNOWN TO DR. Russella Dar SHE HAD A COLONOSCOPY IN 2009 WITH A SMALL AVM , ADENOMATOUS POLYP AND  INTERNAL HEMORRHOIDS.  EGD 2009 WAS NORMAL. SHE COMES IN TODAY WITH C/O LUQ PAIN WHICH HAS BEEN PRESENT SINCE 7/11. SHE SAYS INITIALLY IT WAS INTERMITTENT,BUT HAS BECOME CONSTANT,ACHING AND NONRADIATING. HER SKIN IS SENSITIVE OVER THAT AREA AS WELL. NO CHANGE WITH by mouth INTAKE, NO NAUSEA,INDIGESTION. PAIN EASES WITH LYING DOWN. APPETITE FINE,WEIGHT IS UP.SHE HAD TO TAKE PREDNISONE EARLIER IN THE YEAR AND GAINED 30 POUNDS.SHE DOES C/O GENERALIZED  FATIGUE,"JUST DONT FEEL GOOD". BOWELS NORMAL. SHE DID HAVE DISC SURGERY IN 4/11. NO ASA/NSAIDS CURRENTLY. HAS BEEN TAKING PROTONIX BUT HAS NO EFFECT ON THIS PAIN.   GI Review of Systems    Reports abdominal pain.     Location of  Abdominal pain: left side.    Denies acid reflux, belching, bloating, chest pain, dysphagia with liquids, dysphagia with solids, heartburn, loss of appetite, nausea, vomiting, vomiting blood, weight loss, and  weight gain.        Denies anal fissure, black tarry stools, change in bowel habit, constipation, diarrhea, diverticulosis, fecal incontinence, heme positive stool, hemorrhoids, irritable bowel syndrome, jaundice, light color stool, liver problems, rectal bleeding, and  rectal pain.    Current Medications (verified): 1)  Vitamin D 16109 Unit  Caps (Ergocalciferol) .... Once Per Week 2)  Aspirin 81 Mg  Tbec (Aspirin) .... One By Mouth Every Day 3)  Citalopram Hydrobromide 40 Mg Tabs (Citalopram Hydrobromide) .... Take One Tablet By Mouth Once Daily. 4)  Nitroglycerin 0.4 Mg Subl (Nitroglycerin) .... One Tablet  Under Tongue Every 5 Minutes As Needed For Chest Pain---May Repeat Times Three 5)  Vitamin B-12 Cr 2000 Mcg Cr-Tabs (Cyanocobalamin) .... Once Daily 6)  Furosemide 20 Mg Tabs (Furosemide) .... Take One Tablet By Mouth Once Daily 7)  Potassium Chloride Cr 10 Meq Cr-Caps (Potassium Chloride) .... Take One Tablet By Mouth Every Other Day With The  Furosemide 8)  Docusate Sodium 100 Mg Caps (Docusate Sodium) .... As Needed 9)  Tylenol 8 Hour 650 Mg Cr-Tabs (Acetaminophen) .... As Needed 10)  Vitamin C 500 Mg  Tabs (Ascorbic Acid) .... Once Daily 11)  Methotrexate 2.5 Mg Tabs (Methotrexate Sodium) .... 8 Tablets Once A Week 12)  Protonix 40 Mg Solr (Pantoprazole Sodium) .Marland Kitchen.. 1 Two Times A Day 13)  Prednisone 5 Mg Tabs (Prednisone) .... Take 1 Tablet By Mouth Once A Day  Allergies (verified): 1)  ! Macrobid (Nitrofurantoin Monohyd Macro) 2)  ! Cipro 3)  ! 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 Doppler... July,.. 2010... 40-59% bilaterally.. stable... plan for follow up one year   LEG PAIN (ICD-729.5) LOW BACK PAIN (ICD-724.2)...hospital admission with back pain in April, 2011 surgical repair of a disc herniation L4-L5 OSTEOARTHRITIS (ICD-715.90) COPD (ICD-496) TOBACCO USE DISORDER/SMOKER-SMOKING CESSATION DISCUSSED (ICD-305.1) History gastric ulcer at age 70  Chest Pain .Marland KitchenMyoview scan.... May, 2010.Marland Kitchen ejection fraction 50%.. no ischemia  EF 55-60%.... echo... May, 2010  Past Surgical History: * ANTERIOR AND POSTERIOR COLPORRHAPHY WITH POSTERIOR PEDICLE * SLING CYSTOURETHROPEXY PLUS CYSTOSCOPY. LAMINECTOMY, LUMBAR, HX  OF L4-5 (ICD-V45.89) * BLADDER SURGERYX2 * CERVICAL DISK DECOMPRESSION C5-C6 ,HX OF HYSTERECTOMY (ICD-V88.01) APPENDECTOMY, HX OF (ICD-V45.79) CHOLECYSTECTOMY, HX OF (ICD-V45.79) COLONOSCOPY /EGD 2009 -[facility]  Family History: Reviewed history from 04/23/2008 and no changes required. Family History of Arthritis Family History of Colon Cancer:Paternal grandmother Lung Cancer-father  Brain Cancer-father Family History of Heart Disease: MI-mother, uncles/aunts, grandmother/grandfather Family History of Kidney Disease:mother chronic UTIs Family History of Diabetes: Mother and sister Family History of Ovarian Cancer:aunt  Social History: Reviewed history from 06/30/2010 and no changes required. Single widow Patient currently smokes.  Alcohol Use - no Retired from [employer]  Review of Systems  The patient denies allergy/sinus, anemia, anxiety-new, arthritis/joint pain, back pain, blood in urine, breast changes/lumps, confusion, cough, coughing up blood, depression-new, fainting, fatigue, fever, headaches-new, hearing problems, heart murmur, heart rhythm changes, itching, menstrual pain, muscle pains/cramps, night sweats, nosebleeds, pregnancy symptoms, shortness of breath, skin rash, sleeping problems, sore throat, swelling of feet/legs, swollen lymph glands, thirst - excessive, urination - excessive, urination changes/pain, urine leakage, vision changes, and voice change.         SEE HPI  Vital Signs:  Patient profile:   70 year old female Height:      64 inches Weight:      180 pounds BMI:     31.01 Pulse rate:   72 / minute Pulse rhythm:   regular BP sitting:   130 / 90  (left arm) Cuff size:   regular  Vitals Entered By: Francee Piccolo CMA Duncan Dull) (July 24, 2010 11:03 AM)  Physical Exam  General:  Well developed, well nourished, no acute distress. Head:  Normocephalic and atraumatic. Eyes:  PERRLA, no icterus. Lungs:  Clear throughout to auscultation. Heart:   Regular rate and rhythm; no murmurs, rubs,  or bruits. Abdomen:  SOFT,TENDER EPIGASTRIUM, AND ALONG LUQ,  AND LEFT COSTAL MARGIN., NO MASS OR HSM,BS+ Rectal:  HEME NEGATIVE STOOL Extremities:  No clubbing, cyanosis, edema or deformities noted. Neurologic:  Alert and  oriented x4;  grossly normal neurologically. Psych:  Alert and cooperative. Normal mood and affect.   Impression & Recommendations:  Problem # 1:  ABDOMINAL PAIN, LEFT UPPER QUADRANT (ICD-789.02) Assessment New 69 YO FEMALE WITH 5 MONTH HX OF LUQ PAIN,NONRADIATING. DOUBT GI ETIOLOGY,BUT CANNOT R/O. SUSPECT MUSCKULOSKELETAL/COSTOCHONDRITIS.  SCHEDULE CT OF ABD/PELVIS/ AND LEFT ANT RIBS CMET TODAY PERCOCET 5/325 ;ONE Q 406 HOURS AS NEEDED FOR PAIN #40/0 REFILLS HEATING PAD TO LUQ FURHTER PLANS PENDING CT . Orders: CT Abdomen/Pelvis with Contrast (CT Abd/Pelvis w/con) TLB-CMP (Comprehensive Metabolic Pnl) (80053-COMP)  Problem # 2:  CORONARY ARTERY DISEASE (ICD-414.00) Assessment: Comment Only  Problem # 3:  PERSONAL HISTORY OF COLONIC POLYPS (ICD-V12.72) Assessment: Unchanged ADENOMATOUS-FOLLOW UP DUE 2014  Problem # 4:  RHEUMATOID ARTHRITIS (ICD-714.0) Assessment: Comment Only  Problem # 5:  HYPERTENSION (ICD-401.9) Assessment: Comment Only  Problem # 6:  COPD (ICD-496) Assessment: Comment Only  Patient Instructions: 1)  Please go to lab, basement level. 2)  We scheduled the CT Scan  for Thurs 07-26-2010. 3)  Directions and contrast  provided. 4)  CT location letter provided for you to sign. We will scan it and include it with your medical record.Marland Kitchen 5)  Copy sent to : Cynthis White, MD Prescriptions: PERCOCET 5-325 MG TABS (OXYCODONE-ACETAMINOPHEN) Take 1 tab every 6 hours as needed for pain  #  40 x 0   Entered by:   Lowry Ram NCMA   Authorized by:   Sammuel Cooper PA-c   Signed by:   Lowry Ram NCMA on 07/24/2010   Method used:   Print then Give to Patient   RxID:   [id number]

## 2010-09-14 NOTE — Op Note (Signed)
Summary: [facility]   Imported By: Sherian Rein 09/07/2010 07:31:36  _____________________________________________________________________  External Attachment:    Type:   Image     Comment:   External Document

## 2010-09-14 NOTE — Letter (Signed)
Summary: [facility]  [facility]   Imported By: Sherian Rein 07/28/2010 08:39:12  _____________________________________________________________________  External Attachment:    Type:   Image     Comment:   External Document

## 2010-09-14 NOTE — Op Note (Signed)
Summary: [facility]   Imported By: Sherian Rein 09/07/2010 07:30:38  _____________________________________________________________________  External Attachment:    Type:   Image     Comment:   External Document

## 2010-09-14 NOTE — Procedures (Signed)
Summary: Endoscopy/[place]  Endoscopy/[place]   Imported By: Sherian Rein 08/31/2010 14:58:38  _____________________________________________________________________  External Attachment:    Type:   Image     Comment:   External Document

## 2010-09-14 NOTE — Progress Notes (Signed)
Summary: nos appt  Phone Note Call from Patient   Caller: [email or web address]  Call For: byrum Summary of Call: Rsc nos from 12/12 to 1/11. Initial call taken by: Darletta Moll,  July 25, 2010 9:22 AM

## 2010-09-14 NOTE — Letter (Signed)
[facility] [place]   Imported By: Lester Hiwassee 07/29/2010 08:34:30  _____________________________________________________________________  External Attachment:    Type:   Image     Comment:   External Document

## 2010-09-14 NOTE — Miscellaneous (Signed)
Summary: Orders Update  Clinical Lists Changes  Orders: Added new Test order of Abdominal Aorta Duplex (Abd Aorta Duplex) - Signed 

## 2010-09-14 NOTE — Letter (Signed)
[facility]   Imported By: Lester Wittmann 07/29/2010 08:36:16  _____________________________________________________________________  External Attachment:    Type:   Image     Comment:   External Document

## 2010-09-14 NOTE — Assessment & Plan Note (Signed)
Summary: dyspnea, mixed disease   Visit Type:  Follow-up Copy to:  n/a Primary Provider/Referring Provider:  Laurann Montana, MD  CC:  Dyspnea follow-up w/ PFT. Breathing is the same, denies cough, wheezing, and and chest tightness.  History of Present Illness: 70 yo former smoker, hx carotid disease, RA diagnosed by Dr Nickola Major in June '11, started on MTX in 7/11.  No evidence CAD by myoview in 5/10. Also hx PE in 1988 after a bladder surgery, anti-coag x 61mo.  Referred by Dr Cliffton Asters to evaluate SOB. She tells me that her breathing had some minor limitations up to 2 yrs ago. This improved some when she was treated for Vit D and B12 deficiency. Then became much more bothersome this Summer when she started Prednisone, followed by MTX for her RA. She has gained about 30 lbs since the Summer. She believes that the SOB relates to the initiation of these meds. Workup has included spirometry, not available at this time. No imaging yet. She was started on Spiriva a month ago - she can't tell any difference in her breathing.   Denies cough, wheeze, phlegm. No edema or CP. See other ROS below.   ROV 08/23/10 -- Hx tobacco, follows up for evaluation of SOB. We performed CBC that showed no anemia, CXR without evidence for ILD. PFT's today as below. Tells me that MTX was stopped last Friday, still on pred. Having some nasal congestion today. Her breathing is still difficult w exertion. We stopped Spiriva last time, she doesn't miss it. Not on any other BDs.   Preventive Screening-Counseling & Management  Alcohol-Tobacco     Alcohol drinks/day: 0     Smoking Status: quit     Smoke Cessation Stage: quit     Packs/Day: 1.0     Year Started: 1966     Year Quit: 2009     Pack years: 24  Current Medications (verified): 1)  Vitamin D 04540 Unit  Caps (Ergocalciferol) .... Once Per Week 2)  Aspirin 81 Mg  Tbec (Aspirin) .... One By Mouth Every Day 3)  Citalopram Hydrobromide 40 Mg Tabs (Citalopram Hydrobromide)  .... Take One Tablet By Mouth Once Daily. 4)  Nitroglycerin 0.4 Mg Subl (Nitroglycerin) .... One Tablet Under Tongue Every 5 Minutes As Needed For Chest Pain---May Repeat Times Three 5)  Vitamin B-12 Cr 2000 Mcg Cr-Tabs (Cyanocobalamin) .... Once Daily 6)  Furosemide 20 Mg Tabs (Furosemide) .... Take One Tablet By Mouth Once Daily 7)  Potassium Chloride Cr 10 Meq Cr-Caps (Potassium Chloride) .... Take One Tablet By Mouth Every Other Day With The  Furosemide 8)  Vitamin C 500 Mg  Tabs (Ascorbic Acid) .... Once Daily 9)  Protonix 40 Mg Solr (Pantoprazole Sodium) .... Take 1 Tablet By Mouth Once A Day 10)  Prednisone 5 Mg Tabs (Prednisone) .... Take 1 Tablet By Mouth Once A Day 11)  Pain Patch .... On 12 Hours Off 12 Hours (Pt Unsure of Name)  Allergies (verified): 1)  ! Macrobid (Nitrofurantoin Monohyd Macro) 2)  ! Cipro 3)  ! Levaquin  Social History: Smoking Status:  quit Alcohol drinks/day:  0  Vital Signs:  Patient profile:   70 year old female Height:      66 inches Weight:      180 pounds BMI:     29.16 O2 Sat:      96 % on Room air Temp:     98.9 degrees F oral Pulse rate:   90 / minute BP sitting:  142 / 60  (left arm) Cuff size:   regular  Vitals Entered By: Michel Bickers CMA (August 23, 2010 4:03 PM)  O2 Flow:  Room air CC: Dyspnea follow-up w/ PFT. Breathing is the same, denies cough, wheezing, and chest tightness Comments Medications reviewed with patient Verified contact number and pharmacy with patient Zackery Barefoot CMA  August 23, 2010 4:07 PM    Physical Exam  General:  normal appearance, healthy appearing, and obese.   Head:  normocephalic and atraumatic Eyes:  conjunctiva and sclera clear Nose:  no deformity, discharge, inflammation, or lesions Mouth:  no deformity or lesions Neck:  no masses, thyromegaly, or abnormal cervical nodes Lungs:  clear bilaterally to auscultation Heart:  regular rate and rhythm, S1, S2 without murmurs, rubs, gallops, or  clicks Abdomen:  obese, soft benign Extremities:  no clubbing, cyanosis, edema, or deformity noted Neurologic:  non-focal Skin:  intact without lesions or rashes Psych:  alert and cooperative; normal mood and affect; normal attention span and concentration   Pulmonary Function Test Date: 08/23/2010 Height (in.): 66 Gender: Female  Pre-Spirometry FVC    Value: 2.44 L/min   Pred: 3.13 L/min     % Pred: 78 % FEV1    Value: 1.82 L     Pred: 2.27 L     % Pred: 80 % FEV1/FVC  Value: 75 %     Pred: 72 %    FEF 25-75  Value: 1.40 L/min   Pred: 2.50 L/min     % Pred: 56 %  Post-Spirometry FVC    Value: 2.44 L/min   Pred: 3.13 L/min     % Pred: 78 % FEV1    Value: 1.86 L     Pred: 2.27 L     % Pred: 82 % FEV1/FVC  Value: 76 %     Pred: 72 %    FEF 25-75  Value: 1.59 L/min   Pred: 2.50 L/min     % Pred: 64 %  Lung Volumes TLC    Value: 4.33 L   % Pred: 83 % RV    Value: 1.90 L   % Pred: 92 % DLCO    Value: 17.6 %   % Pred: 79 % DLCO/VA  Value: 5.05 %   % Pred: 142 %  Comments: Possible restriction vs mixed disease, no response to BD, normal volumes (? pseudonormailzation), slight decrease DLCO that corrects to supernormal for alveolar volume. 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 Not clear that she will get much benefit from BD's.  - she needs to concentrate on wt loss - diet, exercise, etc - will teach her to uses a SABA to have as needed  - follow up in 1 yr or as needed  - may need to adjust the prednisone to facilitate wt loss??  Problem # 2:  RHEUMATOID ARTHRITIS (ICD-714.0)  Problem # 3:  PULMONARY EMBOLISM, HX OF (ICD-V12.51)  Her updated medication list for this problem includes:    Aspirin 81 Mg Tbec (Aspirin) ..... One by mouth every day  Medications Added to Medication List This Visit: 1)  Protonix 40 Mg Solr (Pantoprazole sodium) .... Take 1 tablet by mouth  once a day 2)  Pain Patch  .... On 12 hours off 12 hours (pt unsure of name)  Patient Instructions: 1)  We will teach you to use Ventolin. Keep it available  to use 2 puffs up to every 4 hours if needed for shortness of breath.  2)  Work on [employer] and exercise.  Weight loss will help significantly with your breathing.  3)  Follow up with Dr Delton Coombes in 6 months to assess your progress.    Immunization History:  Influenza Immunization History:    Influenza:  historical (05/15/2010)

## 2010-09-14 NOTE — Letter (Signed)
[facility]: [facility]   Imported By: Lennie Odor 09/08/2010 14:26:09  _____________________________________________________________________  External Attachment:    Type:   Image     Comment:   External Document

## 2010-09-14 NOTE — Assessment & Plan Note (Signed)
Summary: [email or web address].Marland Kitchenjj.   History of Present Illness Visit Type: Follow-up Visit Primary GI MD: Elie Goody MD [facility] Primary Provider: Laurann Montana, MD Requesting Provider: n/a Chief Complaint: Patient c/o L chest and LUQ abdominal burning on a constant basis x 3 months. She does often have "chills." She has a "simulator" after back surgery to help with her bowels and she has had no bowel problems since then. History of Present Illness:   Mrs. [name] was evaluated by Mike Gip, PA in December for the same complaints. She underwent a CT scan of the abdomen and pelvis that was unremarkable. She had a thoracic spine CT yesterday that showed some abnormalities and the patient will contact Dr. Lucilla Lame office for further management plans.  She was felt to have musculoskeletal or neuropathic pain leading to her symptoms in December. The patient complains of an ongoing burning pain in her left lower chest that radiates into her left upper quadrant. Symptoms are exacerbated by movement and helped by mild pressure on area and rest. There is no relation to any digestive function.   GI Review of Systems    Reports abdominal pain and  chest pain.     Location of  Abdominal pain: RUQ.    Denies acid reflux, belching, bloating, dysphagia with liquids, dysphagia with solids, heartburn, loss of appetite, nausea, vomiting, vomiting blood, weight loss, and  weight gain.      Reports irritable bowel syndrome.     Denies anal fissure, black tarry stools, change in bowel habit, constipation, diarrhea, diverticulosis, fecal incontinence, heme positive stool, hemorrhoids, jaundice, light color stool, liver problems, rectal bleeding, and  rectal pain. Preventive Screening-Counseling & Management  Alcohol-Tobacco     Smoking Status: quit  Caffeine-Diet-Exercise     Does Patient Exercise: no      Drug Use:  no.     Current Medications (verified): 1)  Vitamin D 09811 Unit  Caps (Ergocalciferol) .... Once  Per Week 2)  Aspirin 81 Mg  Tbec (Aspirin) .... One By Mouth Every Day 3)  Citalopram Hydrobromide 40 Mg Tabs (Citalopram Hydrobromide) .... Take One Tablet By Mouth Once Daily. 4)  Nitroglycerin 0.4 Mg Subl (Nitroglycerin) .... One Tablet Under Tongue Every 5 Minutes As Needed For Chest Pain---May Repeat Times Three 5)  Vitamin B-12 Cr 2000 Mcg Cr-Tabs (Cyanocobalamin) .... Once Daily 6)  Furosemide 20 Mg Tabs (Furosemide) .... Take One Tablet By Mouth Once Daily 7)  Potassium Chloride Cr 10 Meq Cr-Caps (Potassium Chloride) .... Take One Tablet By Mouth Every Other Day With The  Furosemide 8)  Vitamin C 500 Mg  Tabs (Ascorbic Acid) .... Once Daily 9)  Protonix 40 Mg Solr (Pantoprazole Sodium) .... Take 1 Tablet By Mouth Once A Day 10)  Prednisone 5 Mg Tabs (Prednisone) .... Take 1 Tablet By Mouth Once A Day 11)  Pain Patch .... On 12 Hours Off 12 Hours (Pt Unsure of Name)  Allergies (verified): 1)  ! Macrobid (Nitrofurantoin Monohyd Macro) 2)  ! Cipro 3)  ! Levaquin  Past History:  Past Medical History: PULMONARY EMBOLISM, HX OF (ICD-V12.51) RHEUMATOID ARTHRITIS HYPERTENSION (ICD-401.9) INCONTINENCE OF FECES (ICD-787.6) BLADDER PROLAPSE (ICD-596.9) RECTAL PROLAPSE (ICD-569.1) HIATAL HERNIA (ICD-553.3) IRRITABLE BOWEL SYNDROME (ICD-564.1) GASTROESOPHAGEAL REFLUX DISEASE (ICD-530.81) ADENOMATOUS COLON POLYP 02/2008 SMALL COLONIC AVM LEG PAIN (ICD-729.5) LOW BACK PAIN (ICD-724.2)...hospital admission with back pain in April, 2011 repair of a disc herniation L4-L5 OSTEOARTHRITIS (ICD-715.90) COPD (ICD-496) TOBACCO USE DISORDER/SMOKER-SMOKING CESSATION DISCUSSED (ICD-305.1) History gastric ulcer at age 64 Chest  Pain ..Myoview scan.... May, 2010.Marland Kitchen ejection fraction 50%.. no ischemia  Carotid artery disease.. Doppler... July,.. 2010... 40-59% bilaterally.. stable... plan for follow up one year EF 55-60%.... echo... May, 2010  Past Surgical History: * ANTERIOR AND POSTERIOR  COLPORRHAPHY WITH POSTERIOR PEDICLE * SLING CYSTOURETHROPEXY PLUS CYSTOSCOPY. LAMINECTOMY, LUMBAR, HX OF L4-5 (ICD-V45.89) * BLADDER SURGERYX2 * CERVICAL DISK DECOMPRESSION C5-C6 ,HX OF HYSTERECTOMY (ICD-V88.01) APPENDECTOMY, HX OF (ICD-V45.79) CHOLECYSTECTOMY, HX OF (ICD-V45.79) Interstim Metronic Stimulator, 04/2010  COLONOSCOPY /EGD 2009 -Alexiz Cothran  Family History: Family History of Arthritis Family History of Colon Cancer:Paternal grandmother Lung Cancer-father  Brain Cancer-father Family History of Heart Disease: MI-mother, Maternal uncles/aunts, Materanal grandmother/grandfather Family History of Kidney Disease:mother chronic UTIs Family History of Diabetes: Mother and sister Family History of Ovarian Cancer:aunt Family History of Liver Cancer:Father (mets from lungs) Family History of Kidney Cancer: Uncle Family History of Pancreatic Cancer: Grandmother  Social History: Single widow Alcohol Use - no Retired from [employer] Patient is a former smoker. -stopped about 3 years  Illicit Drug Use - no Patient does not get regular exercise.  Drug Use:  no Does Patient Exercise:  no  Review of Systems       The patient complains of arthritis/joint pain, back pain, change in vision, fatigue, heart rhythm changes, muscle pains/cramps, shortness of breath, sleeping problems, and swelling of feet/legs.    Vital Signs:  Patient profile:   70 year old female Height:      66 inches Weight:      179.50 pounds BSA:     1.91 Pulse rate:   68 / minute Pulse rhythm:   irregular BP sitting:   152 / 78  (left arm)  Vitals Entered By: Lamona Curl CMA Duncan Dull) (August 31, 2010 11:41 AM)  Physical Exam  General:  Well developed, well nourished, no acute distress. Head:  Normocephalic and atraumatic. Ears:  Normal auditory acuity. Mouth:  No deformity or lesions, dentition normal. Chest Wall:  Market tenderness over the anterior left costal margin Lungs:  Clear throughout  to auscultation. Heart:  Regular rate and rhythm; no murmurs, rubs,  or bruits. Abdomen:  Mild left upper quadrant tenderness and marked left costal margin tenderness, No rebound or guarding. No palpable organomegaly, masses, or hernias. Normoactive bowel sounds. Psych:  Alert and cooperative. Normal mood and affect.  Impression & Recommendations:  Problem # 1:  CHEST PAIN (ICD-786.50) Left costochondral pain. Rule out neuropathic pain or costochondritis. No further GI evaluation indicated at this time. Defer further management plans to Dr. Cliffton Asters.  Problem # 2:  PERSONAL HISTORY OF COLONIC POLYPS (ICD-V12.72) Surveillance colonoscopy recommended July 2014.  Problem # 3:  GASTROESOPHAGEAL REFLUX DISEASE (ICD-530.81) Continue pantoprazole 40 mg daily, and standard antireflux measures.  Patient Instructions: 1)  Please continue current medications.  2)  Please schedule a follow-up appointment as needed.  3)  Copy sent to : Laurann Montana, MD 4)  The medication list was reviewed and reconciled.  All changed / newly prescribed medications were explained.  A complete medication list was provided to the patient / caregiver.

## 2010-09-14 NOTE — Procedures (Signed)
Summary: Panendoscopy/[place]  Panendoscopy/[place]   Imported By: Sherian Rein 08/31/2010 14:59:47  _____________________________________________________________________  External Attachment:    Type:   Image     Comment:   External Document

## 2010-09-14 NOTE — Miscellaneous (Signed)
Summary: Orders Update pft charges  Clinical Lists Changes  Orders: Added new Service order of Carbon Monoxide diffusing w/capacity (94720) - Signed Added new Service order of Lung Volumes (94240) - Signed Added new Service order of Spirometry (Pre & Post) (94060) - Signed 

## 2010-09-20 NOTE — Miscellaneous (Signed)
Summary: Orders Update   Clinical Lists Changes  Orders: Added new Service order of Est. Patient Level IV (99214) - Signed 

## 2010-09-21 ENCOUNTER — Encounter: Payer: Self-pay | Admitting: Emergency Medicine

## 2010-09-26 ENCOUNTER — Telehealth: Payer: Self-pay | Admitting: Cardiology

## 2010-09-28 ENCOUNTER — Ambulatory Visit (INDEPENDENT_AMBULATORY_CARE_PROVIDER_SITE_OTHER): Payer: Medicare Other | Admitting: Physician Assistant

## 2010-09-28 ENCOUNTER — Other Ambulatory Visit: Payer: Medicare Other

## 2010-09-28 ENCOUNTER — Encounter: Payer: Self-pay | Admitting: Physician Assistant

## 2010-09-28 ENCOUNTER — Encounter (INDEPENDENT_AMBULATORY_CARE_PROVIDER_SITE_OTHER): Payer: Self-pay | Admitting: *Deleted

## 2010-09-28 DIAGNOSIS — R0602 Shortness of breath: Secondary | ICD-10-CM

## 2010-09-29 ENCOUNTER — Inpatient Hospital Stay (HOSPITAL_BASED_OUTPATIENT_CLINIC_OR_DEPARTMENT_OTHER)
Admission: RE | Admit: 2010-09-29 | Discharge: 2010-09-29 | Disposition: A | Discharge status: Home / Self Care | Payer: Medicare Other | Source: Ambulatory Visit | Attending: Cardiology | Admitting: Cardiology

## 2010-09-29 DIAGNOSIS — R0609 Other forms of dyspnea: Secondary | ICD-10-CM | POA: Insufficient documentation

## 2010-09-29 DIAGNOSIS — I2510 Atherosclerotic heart disease of native coronary artery without angina pectoris: Secondary | ICD-10-CM

## 2010-09-29 DIAGNOSIS — Z01812 Encounter for preprocedural laboratory examination: Secondary | ICD-10-CM | POA: Insufficient documentation

## 2010-09-29 DIAGNOSIS — R0989 Other specified symptoms and signs involving the circulatory and respiratory systems: Secondary | ICD-10-CM | POA: Insufficient documentation

## 2010-09-29 DIAGNOSIS — E669 Obesity, unspecified: Secondary | ICD-10-CM | POA: Insufficient documentation

## 2010-09-29 LAB — COMPREHENSIVE METABOLIC PANEL
ALT: 13 U/L (ref 0–35)
BUN: 11 mg/dL (ref 6–23)
CO2: 26 mEq/L (ref 19–32)
Calcium: 8.4 mg/dL (ref 8.4–10.5)
Creatinine, Ser: 0.84 mg/dL (ref 0.4–1.2)
GFR calc non Af Amer: >60 mL/min (ref >60)
Glucose, Bld: 104 mg/dL — ABNORMAL HIGH (ref 70–99)
Sodium: 139 mEq/L (ref 135–145)

## 2010-09-29 LAB — CBC
HCT: 34.6 % — ABNORMAL LOW (ref 36.0–46.0)
Hemoglobin: 11.8 g/dL — ABNORMAL LOW (ref 12.0–15.0)
MCH: 29.5 pg (ref 26.0–34.0)
MCHC: 34.1 g/dL (ref 30.0–36.0)
MCV: 86.5 fL (ref 78.0–100.0)
RDW: 13.8 % (ref 11.5–15.5)

## 2010-09-29 LAB — POCT I-STAT 3, ART BLOOD GAS (G3+)
Bicarbonate: 25 mEq/L — ABNORMAL HIGH (ref 20.0–24.0)
pCO2 arterial: 44 mmHg (ref 35.0–45.0)
pH, Arterial: 7.362 (ref 7.350–7.400)

## 2010-09-29 LAB — POCT I-STAT 3, VENOUS BLOOD GAS (G3P V)
Bicarbonate: 26.3 mEq/L — ABNORMAL HIGH (ref 20.0–24.0)
TCO2: 28 mmol/L (ref 0–100)
pH, Ven: 7.342 — ABNORMAL HIGH (ref 7.250–7.300)
pO2, Ven: 35 mmHg (ref 30.0–45.0)

## 2010-10-04 NOTE — Letter (Signed)
Summary: Cardiac Catheterization Instructions- [facility]  [employer], Main Office  [address]   [place], [place] [address]   Phone: [phone]  Fax: [phone]     09/28/2010 MRN: [id number]  [facility] [address] [place], [place]  [address]  [place]  Dear Ms. Kinder,   You are scheduled for a Cardiac Catheterization on _2/17/12 with Dr. Shirlee Latch.  Please arrive to the 1st floor of the Heart and Vascular Center at [facility] at 8:00 am on the day of your procedure. Please do not arrive before 6:30 a.m. Call the Heart and Vascular Center at [phone] if you are unable to make your appointmnet. The Code to get into the parking garage under the building is 0300. Take the elevators to the 1st floor. You must have someone to drive you home. Someone must be with you for the first 24 hours after you arrive home. Please wear clothes that are easy to get on and off and wear slip-on shoes. Do not eat or drink after midnight except water with your medications that morning. Bring all your medications and current insurance cards with you.    _XXXXX__ Make sure you take your aspirin.  XXXXX___ You may take ALL of your medications with water that morning.  The usual length of stay after your procedure is 2 to 3 hours. This can vary.  If you have any questions, please call the office at the number listed above.   Danielle Rankin, CMA

## 2010-10-04 NOTE — Assessment & Plan Note (Signed)
Summary: Chest pain and dyspnea   Referring Provider:  Laurann Montana, MD Primary Provider:  Laurann Montana, MD  CC:  sob-pt was d/c from her prednisone.  History of Present Illness: Primary Cardiologist:  Dr. Zackery Barefoot  [name] is a 70 yo female with a h/o remote pulmonary embolism, carotid stenosis with 40-59% bilateral ICA stenosis in 2010, hypertension, COPD, GERD, rheumatoid arthritis and irritable bowel syndrome.  She had a heart catheterization in 2002 that demonstrated an ostial 25% plaque in the circumflex, otherwise she had normal coronary arteries.  She had a Myoview in May 2010 that demonstrated no ischemia and an EF of 50%.  An echocardiogram in April 2011 demonstrated an EF of 55-60%; grade one diastolic dysfunction; mild LAE.  She was recently evaluated by pulmonology in January 2012 for dyspnea.  Pulmonary function tests demonstrated no significant abnormality.  P.r.n. short acting beta agonists were recommended and she was to followup in a year.  Gastroenterology saw her in June 2012 for abdominal pain and she had an abdominal CT that was negative for anything remarkable and she was told to followup as needed.  She called in with shortness of breath and chest discomfort recently and was added onto my schedule.  She was started on prednisone last May and has gained about 30 pounds since.  She has noted increasing DOE since then.  As noted above, she saw pulmonology.  She feels her dyspnea is getting worse.  She also notes chest tightness with exertion.  It also wakes her up from sleep sometimes.  She notes assoc jaw pain with it.  She notes some nausea and diaphoresis at times.  No syncope.  No hemoptysis.  Of note, her prednisone was recently stopped.   She has taken NTG with relief of her symptoms.   Current Medications (verified): 1)  Vitamin D 16109 Unit  Caps (Ergocalciferol) .... Once Per Week 2)  Aspirin 81 Mg  Tbec (Aspirin) .... One By Mouth Every Day 3)  Citalopram  Hydrobromide 40 Mg Tabs (Citalopram Hydrobromide) .... Take One Tablet By Mouth Once Daily. 4)  Nitroglycerin 0.4 Mg Subl (Nitroglycerin) .... One Tablet Under Tongue Every 5 Minutes As Needed For Chest Pain---May Repeat Times Three 5)  Vitamin B-12 Cr 2000 Mcg Cr-Tabs (Cyanocobalamin) .... Once Daily 6)  Furosemide 20 Mg Tabs (Furosemide) .... Take One Tablet By Mouth Once Daily 7)  Potassium Chloride Cr 10 Meq Cr-Caps (Potassium Chloride) .... Take One Tablet By Mouth Every Other Day With The  Furosemide 8)  Vitamin C 500 Mg  Tabs (Ascorbic Acid) .... Once Daily 9)  Protonix 40 Mg Solr (Pantoprazole Sodium) .... Take 1 Tablet By Mouth Once A Day 10)  Pain Patch .... On 12 Hours Off 12 Hours (Pt Unsure of Name)  Allergies (verified): 1)  ! Macrobid (Nitrofurantoin Monohyd Macro) 2)  ! Cipro 3)  ! 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 May, 2010.Marland Kitchen ejection fraction 50%.. no ischemia  Carotid artery disease.. Doppler... July,.. 2010... 40-59% bilaterally.. stable... plan  for follow up one year EF 55-60%.... echo... May, 2010  Past Surgical History: Last updated: 08/31/2010 * ANTERIOR AND POSTERIOR COLPORRHAPHY WITH POSTERIOR PEDICLE * SLING CYSTOURETHROPEXY PLUS CYSTOSCOPY. LAMINECTOMY, LUMBAR, HX OF L4-5  (ICD-V45.89) * BLADDER SURGERYX2 * CERVICAL DISK DECOMPRESSION C5-C6 ,HX OF HYSTERECTOMY (ICD-V88.01) APPENDECTOMY, HX OF (ICD-V45.79) CHOLECYSTECTOMY, HX OF (ICD-V45.79) Interstim Metronic Stimulator, 04/2010  COLONOSCOPY /EGD 2009 -[facility]  Family History: Reviewed history from 08/31/2010 and no changes required. Family History of Arthritis Family History of Colon Cancer:Paternal grandmother Lung Cancer-father  Brain Cancer-father Family History of Heart Disease: MI-mother, Maternal uncles/aunts, Materanal grandmother/grandfather Family History of Kidney Disease:mother chronic UTIs Family History of Diabetes: Mother and sister Family History of Ovarian Cancer:aunt Family History of Liver Cancer:Father (mets from lungs) Family History of Kidney Cancer: Uncle Family History of Pancreatic Cancer: Grandmother  Social History: Reviewed history from 08/31/2010 and no changes required. Single widow Alcohol Use - no Retired from [employer] Patient is a former smoker. -stopped about 3 years  Illicit Drug Use - no Patient does not get regular exercise.   Review of Systems       As per  the HPI.  All other systems reviewed and negative.   Vital Signs:  Patient profile:   70 year old female Height:      66 inches Weight:      185 pounds BMI:     29.97 O2 Sat:      97 % on Room air Pulse rate:   90 / minute BP sitting:   138 / 90  (left arm) Cuff size:   regular  Vitals Entered By: Burnett Kanaris, CNA (September 28, 2010 2:30 PM)  O2 Flow:  Room air  Physical Exam  General:  Well nourished, well developed, in no acute distress HEENT: normal Neck: no JVD Cardiac:  normal S1, S2; RRR; no murmur Lungs:  clear to auscultation bilaterally, no wheezing, rhonchi or rales Abd: soft, nontender, no hepatomegaly Ext: no  edema Vascular: no carotid  bruits; bilat FA 2+ without bruits Skin: warm and dry Neuro:  CNs 2-12 intact, no focal abnormalities noted Endo: no  thyromegaly Psych: normal affect   EKG  Procedure date:  09/28/2010  Findings:      normal sinus rhythm Normal axis Nonspecific ST-T wave changes PVC  Impression & Recommendations:  Problem # 1:  CHEST PAIN (ICD-786.50) Her symptoms are concerning for angina.  I will start her on Imdur 30 mg 1/2 tablet daily.  She will continue ASA.  A new Rx for NTG is given.  She knows to go to the ED if she has any further rest symptoms.  I believe she needs cardiac catheterization.  I discussed the case today with Dr. Tenny Craw who agreed.  We will get a right and left heart cath due to her complaints of dyspnea.  Risks and benefits have been discussed with the patient and available family members.  Risks include but are not limited to death, heart attack, stroke, bleeding, infection or allergic reaction from the dye.  The patient is willing to accept these risks and proceed with catheterization.   Problem # 2:  RHEUMATOID ARTHRITIS (ICD-714.0) RA puts her at risk for CAD.  Cardiac cath as above.  Problem # 3:  DYSPNEA ON EXERTION (ICD-786.09) May be anginal equivalent.  Pulmonary workup negative.  Cath as above.  If cath negative, consider CT to r/o PE as she does have a h/o this.  Although, she does not present clinically with  symptoms suggestive of this.  Problem # 4:  HYPERTENSION (ICD-401.9) Controlled.  Problem # 5:  CAROTID BRUIT (ICD-785.9) Will need to make sure she has f/u dopplers arranged when she returns after her cath.  Other Orders: EKG w/ Interpretation (93000) TLB-BMP (Basic Metabolic Panel-BMET) (80048-METABOL) TLB-BNP (B-Natriuretic Peptide) (83880-BNPR) TLB-CBC Platelet - w/Differential (85025-CBCD) TLB-PT (Protime) (85610-PTP) TLB-PTT (85730-PTTL) Prescriptions: NITROGLYCERIN 0.4 MG SUBL (NITROGLYCERIN) One tablet under tongue every 5 minutes as needed for chest pain---may repeat times three  #25 x 7   Entered by:   Danielle Rankin, CMA   Authorized by:   Tereso Newcomer PA-C    Signed by:   Danielle Rankin, CMA on 09/28/2010   Method used:   [facility]  [address] [id number]       [address]       [place], [place]  [address]       Ph: [phone] or [phone]       Fax: [phone]   RxID:   [id number] ISOSORBIDE MONONITRATE CR 30 MG XR24H-TAB (ISOSORBIDE MONONITRATE) 1/2 tab once daily  #30 x 11   Entered by:   Danielle Rankin, CMA   Authorized by:   Tereso Newcomer PA-C   Signed by:   Danielle Rankin, CMA on 09/28/2010   Method used:   [facility]  [address] [id number]       [address]       [place], [place]  [address]       Ph: [phone] or [phone]       Fax: [phone]   RxID:   [id number]  I have personally reviewed the prescriptions today for accuracy.. . Tereso Newcomer PA-C  September 28, 2010 5:34 PM

## 2010-10-04 NOTE — Progress Notes (Signed)
Summary: sob with a headache  Phone Note Call from Patient Call back at Home Phone [phone]   Caller: Patient Summary of Call: Pt having sob and a headache Initial call taken by: Judie Grieve,  September 26, 2010 11:33 AM  Follow-up for Phone Call        spoke w/pt she is having SOB for past week or so, she states everything she does she just give out of breathe, she states she occ. has swelling in hands and feet, she reports she has gained 30lbs over past few months b/c she was on prednisone which has now been tappered off.  She reports that she has seen pulm MD w/no help.  She does report have chest tightness a few times over the past week that awoke her 2 times, it resolved on its own, she generally feels fatigued, will discuss w/PA and call her back Meredith Staggers, RN  September 26, 2010 12:15 PM   Additional Follow-up for Phone Call Additional follow up Details #1::        per Lorin Picket ok to add pt to his sch on Thur. appt sch for 2/16 at 2pm [facility], RN  September 26, 2010 2:07 PM

## 2010-10-06 NOTE — Procedures (Signed)
[name], [name]                ACCOUNT NO.:  [id number]  MEDICAL RECORD NO.:  [id number]            PATIENT TYPE:  LOCATION:                                 FACILITY:  PHYSICIAN:  Marca Ancona, MD           DATE OF BIRTH:  DATE OF PROCEDURE:  09/29/2010 DATE OF DISCHARGE:                           CARDIAC CATHETERIZATION   PROCEDURES: 1. Left heart catheterization. 2. Right heart catheterization. 3. Coronary angiography. 4. Left ventriculography.  OPERATOR:  Marca Ancona, MD  INDICATIONS:  This is a 70 year old who presents with complaints of significant exertional dyspnea that developed over the last several months.  She does have a history of vascular disease and she has known carotid stenosis.  I decided to send her for a left and right heart catheterization.  PROCEDURE NOTE:  After informed consent was obtained, the right groin was sterilely prepped and draped.  Lidocaine 1% was used to locally anesthetize the right groin area.  The right common femoral vein was entered using modified Seldinger technique and 7-French venous sheath was placed.  The right common femoral artery was then accessed using the modified Seldinger technique and a 4-French arterial sheath was placed. Right heart catheterization was carried out using balloon-tip Swan-Ganz catheter.  Samples were removed for oxygen saturation from the pulmonary artery and femoral artery.  The left coronary artery was best engaged using the JL-3.5 catheter and the right coronary artery was engaged using the [facility] catheter.  The left ventricle was entered using the angled pigtail catheter.  There were no complications.  FINDINGS: 1. Hemodynamics:  Mean right atrial pressure 8 mmHg, RV 30/12, and PA     25/14 with mean PA pressure of 19.  Mean pulmonary capillary wedge     pressure 9, LV 149/15, and aorta 145/81.  Cardiac output 4.7 L/min.     Cardiac index 2.5.  Aortic saturation 93%.  PA saturation 62%. 2.  Left ventriculography:  EF was estimated to be 55-60%.  There was     normal wall motion. 3. Right coronary artery:  The right coronary artery was a dominant     vessel with no angiographic disease. 4. Left main:  The left main was very short.  There was no significant     disease. 5. Left circumflex system:  The AV circumflex itself was very small.     The circumflex system was mainly composed of a large obtuse     marginal.  This vessel had about 40% proximal stenosis. 6. LAD system:  The distal LAD was relatively small.  It did plump up     fairly well with intracoronary nitroglycerin.  There was about 40%     distal LAD stenosis.  IMPRESSION: 1. Left and right heart filling pressures are not significantly     elevated. 2. There is no obstructive coronary artery disease. 3. Left ventricular ejection fraction is normal. 4. The patient does have significant dyspnea on exertion.  I do wonder     if this could be due to chronic obstructive pulmonary     disease or  to obesity with deconditioning given her recent 30-pound     weight gain taking prednisone.     Marca Ancona, MD     DM/MEDQ  D:  09/29/2010  T:  09/30/2010  Job:  [id number]  cc:   Stacie Acres. Cliffton Asters, M.D. Tereso Newcomer, PA-C Luis Abed, MD, [facility]  Electronically Signed by Marca Ancona MD on 10/06/2010 10:39:15 AM

## 2010-10-10 NOTE — Letter (Signed)
Summary: Obie Dredge MD/[facility] Dagoberto Ligas MD/[facility]   Imported By: Lester [place] 10/04/2010 09:28:50  _____________________________________________________________________  External Attachment:    Type:   Image     Comment:   External Document

## 2010-10-11 ENCOUNTER — Encounter (INDEPENDENT_AMBULATORY_CARE_PROVIDER_SITE_OTHER): Payer: Medicare Other | Admitting: Physician Assistant

## 2010-10-11 ENCOUNTER — Encounter: Payer: Self-pay | Admitting: Physician Assistant

## 2010-10-11 ENCOUNTER — Encounter: Payer: Self-pay | Admitting: Cardiology

## 2010-10-11 ENCOUNTER — Other Ambulatory Visit: Payer: Self-pay | Admitting: Cardiology

## 2010-10-11 DIAGNOSIS — K219 Gastro-esophageal reflux disease without esophagitis: Secondary | ICD-10-CM

## 2010-10-11 DIAGNOSIS — R0602 Shortness of breath: Secondary | ICD-10-CM

## 2010-10-11 DIAGNOSIS — I4949 Other premature depolarization: Secondary | ICD-10-CM

## 2010-10-11 DIAGNOSIS — R079 Chest pain, unspecified: Secondary | ICD-10-CM

## 2010-10-11 DIAGNOSIS — R0609 Other forms of dyspnea: Secondary | ICD-10-CM

## 2010-10-11 LAB — BASIC METABOLIC PANEL
BUN: 10 mg/dL (ref 6–23)
CO2: 28 mEq/L (ref 19–32)
Chloride: 106 mEq/L (ref 96–112)
Creatinine, Ser: 0.9 mg/dL (ref 0.4–1.2)

## 2010-10-19 NOTE — Miscellaneous (Signed)
Summary: rx instructions changed  Clinical Lists Changes  Medications: Rx of RANITIDINE HCL 150 MG CAPS (RANITIDINE HCL) 1 cap at bedtime;  #30 x 11;  Signed;  Entered by: Danielle Rankin, CMA;  Authorized by: Tereso Newcomer PA-C;  Method used: [facility]  [address] [id number] [address] [place], [place], [place]  [address], Ph: [phone] or [phone], Fax: [phone]    Prescriptions: RANITIDINE HCL 150 MG CAPS (RANITIDINE HCL) 1 cap at bedtime  #30 x 11   Entered by:   Danielle Rankin, CMA   Authorized by:   Tereso Newcomer PA-C   Signed by:   Danielle Rankin, CMA on 10/11/2010   Method used:   Telephoned to ...       [facility]  [address] [id number]       [address]       [place], [place]  [address]       Ph: [phone] or [phone]       Fax: [phone]   RxID:   [id number]

## 2010-10-19 NOTE — Assessment & Plan Note (Signed)
Summary: eph/per Trixie Rude lab-MB   Visit Type:  eph Referring Provider:  Laurann Montana, MD Primary Provider:  Laurann Montana, MD  CC:  shortness of breath and headaches.  History of Present Illness: Primary Cardiologist:  Dr. Zackery Barefoot  Renn Stille is a 70 yo female with a h/o remote pulmonary embolism, carotid stenosis with 40-59% bilateral ICA stenosis in 2010, hypertension, COPD, GERD, rheumatoid arthritis and irritable bowel syndrome.  She had a heart catheterization in 2002 that demonstrated an ostial 25% plaque in the circumflex, otherwise she had normal coronary arteries.  She had a Myoview in May 2010 that demonstrated no ischemia and an EF of 50%.  An echocardiogram in April 2011 demonstrated an EF of 55-60%; grade one diastolic dysfunction; mild LAE.  She was recently evaluated by pulmonology in January 2012 for dyspnea.  Pulmonary function tests demonstrated no significant abnormality.  P.r.n. short acting beta agonists were recommended and she was to followup in a year.  Gastroenterology saw her in June 2012 for abdominal pain and she had an abdominal CT that was negative for anything remarkable and she was told to followup as needed.  I saw her recently for chest pain and shortness of breath.  She had gained 30 pounds,  but her symptoms were concerning.  So, we set her up for cath.  This was done 09/30/10 and demonstrated nonobs. CAD with 40% prox CFX and 40% dist. LAD.  Her EF was normal and her left and right heart filling pressures are not significantly elevated.  She returns for follow up.  She continues to have dyspnea on exertion.  She describes NYHA class II symptoms.  She really denies any significant change in her symptoms since prior to her heart catheterization.  He still occasionally has chest pain.  This still occasionally awakens her at night.  She denies syncope.  She denies orthopnea.  She has mild pedal edema.   Current Medications (verified): 1)  Vitamin D 1000 Unit  Tabs (Cholecalciferol) .... 2 Tab, Once Daily 2)  Aspirin 81 Mg  Tbec (Aspirin) .... One By Mouth Every Day 3)  Citalopram Hydrobromide 40 Mg Tabs (Citalopram Hydrobromide) .... Take One Tablet By Mouth Once Daily. 4)  Nitroglycerin 0.4 Mg Subl (Nitroglycerin) .... One Tablet Under Tongue Every 5 Minutes As Needed For Chest Pain---May Repeat Times Three 5)  Vitamin B-12 Cr 2000 Mcg Cr-Tabs (Cyanocobalamin) .... Once Daily 6)  Furosemide 20 Mg Tabs (Furosemide) .... Take One Tablet By Mouth Once Daily 7)  Potassium Chloride Cr 10 Meq Cr-Caps (Potassium Chloride) .... Take One Tablet By Mouth Every Other Day With The  Furosemide 8)  Vitamin C 500 Mg  Tabs (Ascorbic Acid) .... Once Daily 9)  Protonix 40 Mg Solr (Pantoprazole Sodium) .... Take 1 Tablet By Mouth Once A Day 10)  Pain Patch .... On 12 Hours Off 12 Hours As Needed 11)  Ranitidine Hcl 150 Mg Caps (Ranitidine Hcl) .Marland Kitchen.. 1 Cap At Bedtime  Allergies (verified): 1)  ! Macrobid (Nitrofurantoin Monohyd Macro) 2)  ! Cipro 3)  ! 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 May, 2010.Marland Kitchen ejection fraction 50%.. no ischemia   a.  cardiac catheterization for breaking 2012: Non-obstructive CAD (pCFX 40%, dLAD 40%, EF normal) Carotid artery disease.. Doppler... July,.. 2010... 40-59% bilaterally.. stable...  plan for follow up one year EF 55-60%.... echo... May, 2010  Review of Systems       As per  the HPI.  All other systems reviewed and negative.   Vital Signs:  Patient profile:   70 year old female Height:      66 inches Weight:      187.50 pounds BMI:     30.37 Pulse rate:   94 / minute BP sitting:   132 / 86  (left arm) Cuff size:   large  Vitals Entered By: Caralee Ates CMA (October 11, 2010 10:49 AM)  Physical Exam  General:  Well nourished, well developed, in no acute distress HEENT: normal Neck: no JVD Cardiac:  normal S1, S2; RRR; no murmur Lungs:  clear to auscultation bilaterally, no wheezing, rhonchi or rales Abd: soft, nontender, no hepatomegaly Ext: no  edema; RFA site without hematoma or bruit Skin: warm and dry Neuro:  CNs 2-12 intact, no focal abnormalities noted   EKG  Procedure date:  10/11/2010  Findings:      normal sinus rhythm Heart rate 83 Normal axis PVCs Nonspecific ST-T wave changes No significant changes previous tracings  Impression & Recommendations:  Problem # 1:  PREMATURE VENTRICULAR CONTRACTIONS (ICD-427.69) She had some PVCs during exam and also noted on her electrocardiogram today.  I will check her electrolytes as she is on furosemide.  Orders: EKG w/ Interpretation (93000) T-D-[facility]) (85379-DIMR) TLB-BMP (Basic Metabolic Panel-BMET) (80048-METABOL) TLB-BNP (B-Natriuretic Peptide) (83880-BNPR) TLB-Magnesium (Mg) (83735-MG)  Problem # 2:  DYSPNEA ON EXERTION (ICD-786.09) I suspect that her symptoms are related to deconditioning as well as weight gain from chronic prednisone therapy.  She is now off prednisone.  She does have a past history of pulmonary embolism.  The likelihood of this would be quite small with her near normal right-sided heart pressures at catheterization.  I suppose that she could have small emboli out in the distal vessels that could contribute to symptoms.  Therefore, she will have a d-dimer  checked today.  If this is negative, no further workup.  If it is abnormal, we will send her for a spiral CT of the chest.  I will also get a BNP.  Of note, her wedge pressure was normal at her recent heart catheterization.  However, if her BNP is elevated, I will adjust her Lasix.  Orders: T-D-D[facility]) [phone])  Problem # 3:  CHEST PAIN (ICD-786.50) As noted, she had nonobstructive disease at cardiac catheterization.  Her chest pain is noncardiac.  As noted above, we're checking a d-dimer and BNP.  Some of her symptoms sound suggestive of acid reflux disease.  She is already on a proton pump inhibitor.  I have placed her on Zantac 150 mg q.h.s.  Orders: T-D-Dimer [facility]) [phone])  Problem # 4:  CORONARY ARTERY DISEASE (ICD-414.00) As noted above.  Continue aspirin.  Followup with Dr. Myrtis Ser in 4-6 weeks.  Patient Instructions: 1)  Your physician recommends that you schedule a follow-up appointment in: 4-6 weeks with Dr. Myrtis Ser as per Tereso Newcomer, PA-C. 2)  Your physician recommends that you return for lab work in: TODAY BMET 427.69, BNP 427.69, MAG 427.69, D-DIMER 427.69, 786.09 3)  Your physician  has recommended you make the following change in your medication: START RANITIDINE 150 MG 1 TABLET ONCE DAILY...TAKE 30 MINUTES BEFORE EATING...CONTINUE ON PROTONIX Prescriptions: RANITIDINE HCL 150 MG CAPS (RANITIDINE HCL) 1 cap once daily....take 30 minutes before eating  #30 x 11   Entered by:   Danielle Rankin, CMA   Authorized by:   Tereso Newcomer PA-C   Signed by:   Danielle Rankin, CMA on 10/11/2010   Method used:   [facility]        [facility]  [address] [facility]       [address]       [place], [place]  [address]       Ph: [phone] or [phone]       Fax: [phone]   RxID:   [id number]  CMA spoke with patient and changed instructions on zantac to at bedtime. Tereso Newcomer PA-C  October 11, 2010 5:22 PM

## 2010-10-31 LAB — CBC
HCT: 30.3 % — ABNORMAL LOW (ref 36.0–46.0)
Hemoglobin: 10.2 g/dL — ABNORMAL LOW (ref 12.0–15.0)
Hemoglobin: 10.6 g/dL — ABNORMAL LOW (ref 12.0–15.0)
MCHC: 33.8 g/dL (ref 30.0–36.0)
MCHC: 34.9 g/dL (ref 30.0–36.0)
MCV: 88.5 fL (ref 78.0–100.0)
RBC: 3.42 MIL/uL — ABNORMAL LOW (ref 3.87–5.11)
RDW: 13.4 % (ref 11.5–15.5)
RDW: 13.4 % (ref 11.5–15.5)
WBC: 7.3 10*3/uL (ref 4.0–10.5)

## 2010-10-31 LAB — BASIC METABOLIC PANEL
BUN: 8 mg/dL (ref 6–23)
Calcium: 7.9 mg/dL — ABNORMAL LOW (ref 8.4–10.5)
Creatinine, Ser: 0.74 mg/dL (ref 0.4–1.2)
GFR calc non Af Amer: >60 mL/min (ref >60)
Glucose, Bld: 104 mg/dL — ABNORMAL HIGH (ref 70–99)

## 2010-11-01 ENCOUNTER — Other Ambulatory Visit: Payer: Self-pay | Admitting: Cardiology

## 2010-11-01 LAB — CROSSMATCH
ABO/RH(D): O POS
Antibody Screen: NEGATIVE

## 2010-11-01 LAB — LIPID PANEL
HDL: 39 mg/dL — ABNORMAL LOW
Total CHOL/HDL Ratio: 5.8 ratio
Triglycerides: 223 mg/dL — ABNORMAL HIGH
VLDL: 45 mg/dL — ABNORMAL HIGH (ref 0–40)

## 2010-11-01 LAB — BASIC METABOLIC PANEL WITH GFR
BUN: 10 mg/dL (ref 6–23)
BUN: 12 mg/dL (ref 6–23)
CO2: 28 meq/L (ref 19–32)
CO2: 29 meq/L (ref 19–32)
Calcium: 8.4 mg/dL (ref 8.4–10.5)
Calcium: 8.6 mg/dL (ref 8.4–10.5)
Chloride: 103 meq/L (ref 96–112)
Chloride: 103 meq/L (ref 96–112)
Creatinine, Ser: 0.75 mg/dL (ref 0.4–1.2)
Creatinine, Ser: 0.85 mg/dL (ref 0.4–1.2)
GFR calc non Af Amer: >60 mL/min
GFR calc non Af Amer: >60 mL/min
Glucose, Bld: 138 mg/dL — ABNORMAL HIGH (ref 70–99)
Glucose, Bld: 93 mg/dL (ref 70–99)
Potassium: 4.3 meq/L (ref 3.5–5.1)
Potassium: 4.5 meq/L (ref 3.5–5.1)
Sodium: 138 meq/L (ref 135–145)
Sodium: 139 meq/L (ref 135–145)

## 2010-11-01 LAB — CARDIAC PANEL(CRET KIN+CKTOT+MB+TROPI)
CK, MB: 1.3 ng/mL (ref 0.3–4.0)
CK, MB: 1.9 ng/mL (ref 0.3–4.0)
Relative Index: INVALID (ref 0.0–2.5)
Relative Index: INVALID (ref 0.0–2.5)
Relative Index: INVALID (ref 0.0–2.5)
Relative Index: INVALID (ref 0.0–2.5)
Total CK: 14 U/L (ref 7–177)
Total CK: 15 U/L (ref 7–177)
Troponin I: 0.01 ng/mL (ref 0.00–0.06)
Troponin I: 0.01 ng/mL (ref 0.00–0.06)

## 2010-11-01 LAB — CBC
HCT: 38.7 % (ref 36.0–46.0)
HCT: 38.8 % (ref 36.0–46.0)
HCT: 39.2 % (ref 36.0–46.0)
Hemoglobin: 12.3 g/dL (ref 12.0–15.0)
Hemoglobin: 13.2 g/dL (ref 12.0–15.0)
Hemoglobin: 13.4 g/dL (ref 12.0–15.0)
MCHC: 34.1 g/dL (ref 30.0–36.0)
MCHC: 34.5 g/dL (ref 30.0–36.0)
MCHC: 34.8 g/dL (ref 30.0–36.0)
MCV: 87.4 fL (ref 78.0–100.0)
MCV: 87.6 fL (ref 78.0–100.0)
Platelets: 221 10*3/uL (ref 150–400)
Platelets: 221 10*3/uL (ref 150–400)
Platelets: 236 K/uL (ref 150–400)
RBC: 4.47 MIL/uL (ref 3.87–5.11)
RDW: 12.8 % (ref 11.5–15.5)
RDW: 13.1 % (ref 11.5–15.5)
RDW: 13.3 % (ref 11.5–15.5)
RDW: 13.4 % (ref 11.5–15.5)
WBC: 6.9 K/uL (ref 4.0–10.5)

## 2010-11-01 LAB — URINALYSIS, ROUTINE W REFLEX MICROSCOPIC
Bilirubin Urine: NEGATIVE
Glucose, UA: NEGATIVE mg/dL
Hgb urine dipstick: NEGATIVE
Ketones, ur: NEGATIVE mg/dL
Nitrite: NEGATIVE
Protein, ur: NEGATIVE mg/dL
Specific Gravity, Urine: 1.009 (ref 1.005–1.030)
Urobilinogen, UA: 0.2 mg/dL (ref 0.0–1.0)
pH: 6.5 (ref 5.0–8.0)

## 2010-11-01 LAB — DIFFERENTIAL
Basophils Absolute: 0.1 K/uL (ref 0.0–0.1)
Basophils Relative: 1 % (ref 0–1)
Eosinophils Absolute: 0.3 K/uL (ref 0.0–0.7)
Eosinophils Relative: 5 % (ref 0–5)
Lymphocytes Relative: 25 % (ref 12–46)
Lymphs Abs: 1.8 K/uL (ref 0.7–4.0)
Monocytes Absolute: 0.6 K/uL (ref 0.1–1.0)
Monocytes Relative: 9 % (ref 3–12)
Neutro Abs: 4.2 K/uL (ref 1.7–7.7)
Neutrophils Relative %: 60 % (ref 43–77)

## 2010-11-01 LAB — COMPREHENSIVE METABOLIC PANEL WITH GFR
ALT: 11 U/L (ref 0–35)
AST: 12 U/L (ref 0–37)
Albumin: 3.3 g/dL — ABNORMAL LOW (ref 3.5–5.2)
Alkaline Phosphatase: 52 U/L (ref 39–117)
BUN: 15 mg/dL (ref 6–23)
CO2: 27 meq/L (ref 19–32)
Calcium: 8.8 mg/dL (ref 8.4–10.5)
Chloride: 101 meq/L (ref 96–112)
Creatinine, Ser: 0.85 mg/dL (ref 0.4–1.2)
GFR calc non Af Amer: >60 mL/min
Glucose, Bld: 124 mg/dL — ABNORMAL HIGH (ref 70–99)
Potassium: 4.6 meq/L (ref 3.5–5.1)
Sodium: 135 meq/L (ref 135–145)
Total Bilirubin: 0.8 mg/dL (ref 0.3–1.2)
Total Protein: 6.2 g/dL (ref 6.0–8.3)

## 2010-11-01 LAB — BASIC METABOLIC PANEL
CO2: 27 mEq/L (ref 19–32)
GFR calc non Af Amer: >60 mL/min (ref >60)
Glucose, Bld: 91 mg/dL (ref 70–99)
Potassium: 4.1 mEq/L (ref 3.5–5.1)
Sodium: 136 mEq/L (ref 135–145)

## 2010-11-01 LAB — PROTIME-INR
INR: 0.99 (ref 0.00–1.49)
Prothrombin Time: 13 s (ref 11.6–15.2)

## 2010-11-01 LAB — SEDIMENTATION RATE: Sed Rate: 4 mm/h (ref 0–22)

## 2010-11-01 LAB — URINE MICROSCOPIC-ADD ON

## 2010-11-01 LAB — D-DIMER, QUANTITATIVE: D-Dimer, Quant: <0.22 ug/mL-FEU (ref 0.00–0.48)

## 2010-11-01 LAB — APTT: aPTT: 28 s (ref 24–37)

## 2010-11-07 ENCOUNTER — Other Ambulatory Visit: Payer: Self-pay

## 2010-11-07 DIAGNOSIS — I10 Essential (primary) hypertension: Secondary | ICD-10-CM

## 2010-11-07 MED ORDER — FUROSEMIDE 20 MG PO TABS: 20.0000 mg | ORAL_TABLET | Freq: Every day | ORAL | Status: DC

## 2010-11-08 ENCOUNTER — Encounter: Payer: Self-pay | Admitting: Cardiology

## 2010-11-22 ENCOUNTER — Ambulatory Visit: Payer: Medicare Other | Admitting: Cardiology

## 2010-11-27 LAB — COMPREHENSIVE METABOLIC PANEL
AST: 13 U/L (ref 0–37)
BUN: 13 mg/dL (ref 6–23)
CO2: 29 mEq/L (ref 19–32)
Chloride: 103 mEq/L (ref 96–112)
Creatinine, Ser: 0.74 mg/dL (ref 0.4–1.2)
GFR calc non Af Amer: >60 mL/min (ref >60)
Total Bilirubin: 0.6 mg/dL (ref 0.3–1.2)

## 2010-11-27 LAB — CBC
HCT: 39.2 % (ref 36.0–46.0)
Hemoglobin: 13.5 g/dL (ref 12.0–15.0)
MCV: 88.4 fL (ref 78.0–100.0)
RBC: 4.43 MIL/uL (ref 3.87–5.11)
WBC: 11.6 10*3/uL — ABNORMAL HIGH (ref 4.0–10.5)

## 2010-11-28 LAB — LUPUS ANTICOAGULANT PANEL

## 2010-11-28 LAB — CBC
HCT: 31.6 % — ABNORMAL LOW (ref 36.0–46.0)
MCV: 88.4 fL (ref 78.0–100.0)
Platelets: 291 10*3/uL (ref 150–400)
RDW: 13.1 % (ref 11.5–15.5)

## 2010-11-28 LAB — PROTEIN S, TOTAL: Protein S Ag, Total: 92 % (ref 70–140)

## 2010-11-28 LAB — PROTEIN C, TOTAL: Protein C, Total: 82 % (ref 70–140)

## 2010-11-28 LAB — CARDIOLIPIN ANTIBODIES, IGG, IGM, IGA
Anticardiolipin IgA: 51 [APL'U] (ref <13)
Anticardiolipin IgG: <7 [GPL'U] — ABNORMAL LOW (ref <11)

## 2010-11-28 LAB — ANTITHROMBIN III: AntiThromb III Func: 94 % (ref 76–126)

## 2010-11-30 ENCOUNTER — Encounter: Payer: Self-pay | Admitting: Cardiology

## 2010-11-30 DIAGNOSIS — F17200 Nicotine dependence, unspecified, uncomplicated: Secondary | ICD-10-CM | POA: Insufficient documentation

## 2010-11-30 DIAGNOSIS — I10 Essential (primary) hypertension: Secondary | ICD-10-CM | POA: Insufficient documentation

## 2010-11-30 DIAGNOSIS — J449 Chronic obstructive pulmonary disease, unspecified: Secondary | ICD-10-CM | POA: Insufficient documentation

## 2010-11-30 DIAGNOSIS — I2510 Atherosclerotic heart disease of native coronary artery without angina pectoris: Secondary | ICD-10-CM | POA: Insufficient documentation

## 2010-11-30 DIAGNOSIS — M069 Rheumatoid arthritis, unspecified: Secondary | ICD-10-CM | POA: Insufficient documentation

## 2010-11-30 DIAGNOSIS — Q2730 Arteriovenous malformation, site unspecified: Secondary | ICD-10-CM | POA: Insufficient documentation

## 2010-11-30 DIAGNOSIS — I2699 Other pulmonary embolism without acute cor pulmonale: Secondary | ICD-10-CM | POA: Insufficient documentation

## 2010-12-01 ENCOUNTER — Ambulatory Visit: Payer: Medicare Other | Admitting: Cardiology

## 2010-12-08 ENCOUNTER — Ambulatory Visit: Payer: Medicare Other | Admitting: Cardiology

## 2010-12-11 ENCOUNTER — Ambulatory Visit (INDEPENDENT_AMBULATORY_CARE_PROVIDER_SITE_OTHER): Payer: Medicare Other | Admitting: Cardiology

## 2010-12-11 ENCOUNTER — Encounter: Payer: Self-pay | Admitting: Cardiology

## 2010-12-11 VITALS — BP 124/76 | HR 83 | Resp 14 | Ht 64.0 in | Wt 182.0 lb

## 2010-12-11 DIAGNOSIS — IMO0002 Reserved for concepts with insufficient information to code with codable children: Secondary | ICD-10-CM | POA: Insufficient documentation

## 2010-12-11 DIAGNOSIS — I779 Disorder of arteries and arterioles, unspecified: Secondary | ICD-10-CM | POA: Insufficient documentation

## 2010-12-11 DIAGNOSIS — R0602 Shortness of breath: Secondary | ICD-10-CM

## 2010-12-11 DIAGNOSIS — I2510 Atherosclerotic heart disease of native coronary artery without angina pectoris: Secondary | ICD-10-CM

## 2010-12-11 DIAGNOSIS — I739 Peripheral vascular disease, unspecified: Secondary | ICD-10-CM

## 2010-12-11 DIAGNOSIS — R9430 Abnormal result of cardiovascular function study, unspecified: Secondary | ICD-10-CM | POA: Insufficient documentation

## 2010-12-11 NOTE — Assessment & Plan Note (Signed)
The patient does have mild coronary artery disease.  Further workup is not needed at this time.

## 2010-12-11 NOTE — Assessment & Plan Note (Addendum)
At this point it appears that her shortness of breath is not cardiac in origin.  She has grade 1 diastolic dysfunction by echo.  There is normal systolic function.  She does not have significant fluid overload at this point.  She is on a diuretic.  I would recommend no further cardiac workup at this point.  I suspect that her shortness of breath is multifactorial.  Some of it is related to being overweight.  She does have some coronary disease.  She has some diastolic dysfunction.  It appears that cardiac ischemia is not the issue.

## 2010-12-11 NOTE — Patient Instructions (Signed)
Your physician has requested that you have a carotid duplex. This test is an ultrasound of the carotid arteries in your neck. It looks at blood flow through these arteries that supply the brain with blood. Allow one hour for this exam. There are no restrictions or special instructions.  Your physician wants you to follow-up in: 1 year.  You will receive a reminder letter in the mail two months in advance. If you don't receive a letter, please call our office to schedule the follow-up appointment.  

## 2010-12-11 NOTE — Assessment & Plan Note (Signed)
Patient does have carotid artery disease.  It appears that it is time for followup Doppler.  We will check on the timing.  I will see her for cardiology follow up in one year.

## 2010-12-11 NOTE — Progress Notes (Signed)
HPI Patient is seen today for followup shortness of breath.She continues to have shortness of breath.  Cardiac catheterization was done in February, 2012.  She had mild nonobstructive coronary disease.  Ejection fraction was normal.  Right heart pressures were not significantly elevated.  She saw Dr. Delton Coombes for pulmonary evaluation.  He felt that exact etiology of her shortness of breath was not clear.  She has some mixed abnormalities on her pulmonary function studies.  She also has a history of rheumatoid arthritis.  When she was seen in followup in our office on October 11, 2010 chemistry was checked and showed no marked abnormality. BNP was normal.  D-dimer was normal. Allergies  Allergen Reactions  . Ciprofloxacin   . Levofloxacin   . Nitrofurantoin     Current Outpatient Prescriptions  Medication Sig Dispense Refill  . Ascorbic Acid (VITAMIN C) 500 MG tablet Take 500 mg by mouth daily.        Marland Kitchen aspirin 81 MG tablet Take 81 mg by mouth daily.        . Cholecalciferol (VITAMIN D) 1000 UNITS capsule Take 1,000 Units by mouth 2 (two) times daily.        . citalopram (CELEXA) 40 MG tablet Take 40 mg by mouth daily.        . cyanocobalamin 2000 MCG tablet Take 2,000 mcg by mouth daily.        . furosemide (LASIX) 20 MG tablet Take 1 tablet (20 mg total) by mouth daily.  30 tablet  11  . nitroGLYCERIN (NITROSTAT) 0.4 MG SL tablet Place 0.4 mg under the tongue every 5 (five) minutes as needed.        . NON FORMULARY Pain Patch  On 12 hours off 12 hours as needed       . pantoprazole (PROTONIX) 40 MG tablet Take 40 mg by mouth daily.        . potassium chloride (KLOR-CON) 10 MEQ CR tablet Take 10 mEq by mouth every other day.        . ranitidine (ZANTAC) 150 MG capsule Take 150 mg by mouth every evening.          History   Social History  . Marital Status: Widowed    Spouse Name: N/A    Number of Children: N/A  . Years of Education: N/A   Occupational History  . Not on file.    Social History Main Topics  . Smoking status: Former Smoker    Quit date: 08/14/2007  . Smokeless tobacco: Not on file  . Alcohol Use:   . Drug Use: No  . Sexually Active:    Other Topics Concern  . 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 -stopped about 3 years Illicit Drug Use - noPatient does not get regular exercise.     Family History  Problem Relation Age of Onset  . Arthritis      family history  . Colon cancer      Paternal grandmother  . Lung cancer      father  . Brain cancer      father  . Heart disease Mother   . Kidney disease Mother     chronic UTI  . Diabetes Mother   . Liver cancer Father   . Diabetes Sister   . Ovarian cancer Maternal Aunt   . Heart disease Maternal Uncle   . Kidney cancer Maternal  Uncle   . Heart disease Paternal Aunt   . Pancreatic cancer Maternal Grandmother     Past Medical History  Diagnosis Date  . Pulmonary embolism   . Rheumatoid arthritis   . Hypertension   . Incontinence of feces   . Unspecified disorder of bladder   . Rectal prolapse   . Hiatal hernia   . IBS (irritable bowel syndrome)   . GERD (gastroesophageal reflux disease)   . Pain in limb   . Lumbago     hospital admission with back pain in April, 2011 repair of a disc herniation L4-L5   . Osteoarthrosis, unspecified whether generalized or localized, unspecified site   . COPD (chronic obstructive pulmonary disease)   . Tobacco use disorder   . CAD (coronary artery disease)     Nuclear, May, 2010, no ischemia / catheterization 2012, nonobstructive CAD ( 40% circumflex, 40% LAD) EF normal  . Carotid artery occlusion     Doppler, July, 2010, 40-59% bilateral, stable, followup one year  . Adenomatous polyp   . AVM (arteriovenous malformation)     Small colonic  . Ejection fraction     55-60% echo, 2010 / EF normal catheterization 2012  . Shortness of breath     Cardiac  evaluation, Dr.Ramiro Pangilinan, pulmonary evaluation, 2012, Dr. Delton Coombes.    Past Surgical History  Procedure Date  . Laminectomy     lumbar, HX of L4-5   . Cervical disc compression     c5-c6 Hx of  . Appendectomy   . Cholecystectomy     Interstim Medtronic stimulator 04/2010  . Colonoscopy     EGD 2009--[facility]    ROS  Patient denies fever, chills, headache, sweats, rash, change in vision, change in hearing, chest pain, cough, nausea vomiting, urinary symptoms.  All other systems are reviewed and are negative.  PHYSICAL EXAM Patient is overweight.  She is oriented to person time and place.  Affect is normal.  Head is atraumatic.  Lungs are clear.  Respiratory effort is not labored.  Cardiac exam reveals an S1-S2.  No clicks or significant murmurs.  The abdomen is soft.  She has no significant peripheral edema. Filed Vitals:   12/11/10 1039  BP: 124/76  Pulse: 83  Resp: 14  Height: 5\' 4"  (1.626 m)  Weight: 182 lb (82.555 kg)    EKG  EKG is not done today.  ASSESSMENT & PLAN

## 2010-12-18 ENCOUNTER — Other Ambulatory Visit: Payer: Self-pay | Admitting: Rheumatology

## 2010-12-18 DIAGNOSIS — R0602 Shortness of breath: Secondary | ICD-10-CM

## 2010-12-18 DIAGNOSIS — Z79899 Other long term (current) drug therapy: Secondary | ICD-10-CM

## 2010-12-18 DIAGNOSIS — M069 Rheumatoid arthritis, unspecified: Secondary | ICD-10-CM

## 2010-12-20 ENCOUNTER — Ambulatory Visit (INDEPENDENT_AMBULATORY_CARE_PROVIDER_SITE_OTHER): Payer: Medicare Other | Admitting: Gastroenterology

## 2010-12-20 ENCOUNTER — Encounter: Payer: Self-pay | Admitting: Gastroenterology

## 2010-12-20 VITALS — BP 130/70 | HR 80 | Ht 64.5 in | Wt 179.4 lb

## 2010-12-20 DIAGNOSIS — Z86010 Personal history of colonic polyps: Secondary | ICD-10-CM

## 2010-12-20 DIAGNOSIS — K589 Irritable bowel syndrome without diarrhea: Secondary | ICD-10-CM

## 2010-12-20 DIAGNOSIS — K649 Unspecified hemorrhoids: Secondary | ICD-10-CM

## 2010-12-20 MED ORDER — GLYCOPYRROLATE 2 MG PO TABS: 2.0000 mg | ORAL_TABLET | Freq: Two times a day (BID) | ORAL | Status: DC | PRN

## 2010-12-20 MED ORDER — MESALAMINE 1000 MG RE SUPP: 1000.0000 mg | Freq: Every day | RECTAL | Status: DC

## 2010-12-20 NOTE — Progress Notes (Signed)
History of Present Illness: This is a 70 year old female with chronic complaints of upper abdominal pain that is associated with abdominal bloating and alternating diarrhea and constipation. She was evaluated earlier this year in our office for abdominal pain and underwent a CT scan of the abdomen and pelvis which was unremarkable. Upper endoscopy in July 2009 was normal. Colonoscopy July 2009 revealed an adenomatous colon polyp, and AVM internal hemorrhoids. She complains of intermittent small amounts of rectal bleeding associated with rectal pain. She feels is related to hemorrhoids and has tried Preparation H suppositories without significant improvement in symptoms. Colonoscopy performed in 2009 did show small internal hemorrhoids. She also feels she is having gastrointestinal side effects from methotrexate.  Current Medications, Allergies, Past Medical History, Past Surgical History, Family History and Social History were reviewed in [employer] record.  Physical Exam: General: Well developed , well nourished, no acute distress Head: Normocephalic and atraumatic Eyes:  sclerae anicteric, EOMI Ears: Normal auditory acuity Mouth: No deformity or lesions Lungs: Clear throughout to auscultation Heart: Regular rate and rhythm; no murmurs, rubs or bruits Abdomen: Soft, non tender and non distended. No masses, hepatosplenomegaly or hernias noted. Normal Bowel sounds Rectal: Small external hemorrhoids, no internal lesions, Hemoccult negative soft brown stool in the vault Musculoskeletal: Symmetrical with no gross deformities  Pulses:  Normal pulses noted Extremities: No clubbing, cyanosis, edema or deformities noted Neurological: Alert oriented x 4, grossly nonfocal Psychological:  Alert and cooperative. Anxious.   Assessment and Recommendations:  1. Internal and external hemorrhoids with intermittent rectal pain and bleeding. Begin all standard rectal care instructions and  Canasa suppositories for 2 weeks and then as needed. Consider Consider a topical hemorrhoidal agent, possibly with lidocaine, if her symptoms are not controlled Consider evaluation with flexible sigmoidoscopy for possible injection or banding however given her significant comorbidities, prior history of incontinence and prior neurologic problems I would like to avoid any procedures that are not absolutely necessary.  2. Irritable bowel syndrome. This is characterized by abdominal pain, abdominal bloating and alternating bowel habits. Begin glycopyrrolate 2 mg twice a day as needed. Consider trial of probiotics if this is not helpful in controlling her symptoms.  3. Adenomatous colon polyps. Surveillance colonoscopy recommended July 2014.

## 2010-12-20 NOTE — Patient Instructions (Addendum)
Sitz bath and Rectal care instructions given. Your prescriptions have been sent to your pharmacy. XB:[id number] Cliffton Asters, MD

## 2010-12-22 ENCOUNTER — Ambulatory Visit
Admission: RE | Admit: 2010-12-22 | Discharge: 2010-12-22 | Disposition: A | Discharge status: Home / Self Care | Payer: Medicare Other | Source: Ambulatory Visit | Attending: Rheumatology | Admitting: Rheumatology

## 2010-12-22 ENCOUNTER — Encounter: Payer: Medicare Other | Admitting: *Deleted

## 2010-12-22 DIAGNOSIS — R0602 Shortness of breath: Secondary | ICD-10-CM

## 2010-12-26 ENCOUNTER — Other Ambulatory Visit: Payer: Self-pay | Admitting: *Deleted

## 2010-12-26 ENCOUNTER — Encounter: Payer: Medicare Other | Admitting: *Deleted

## 2010-12-26 NOTE — Op Note (Signed)
NAMEAIMEE, [name]                ACCOUNT NO.:  [id number]   MEDICAL RECORD NO.:  [id number]          PATIENT TYPE:  AMB   LOCATION:  DAY                          FACILITY[facility]   PHYSICIAN:  [name], M.D.DATE OF BIRTH:  [birth year]   DATE OF PROCEDURE:  05/14/2008  DATE OF DISCHARGE:                               OPERATIVE REPORT   SURGEON:  Brenda Lynch. Darrelyn [name], M.D.   ASSISTANT:  [name], M.D.   PREOPERATIVE DIAGNOSES:  1. Severe spinal stenosis at L4-5.  2. Bilateral herniated disk at L4-5.  She presented with severe pain      in her left lower extremity more than her right lower extremity.   POSTOPERATIVE DIAGNOSES:  1. Severe spinal stenosis at L4-5.  2. Bilateral herniated disk at L4-5.  She presented with severe pain      in her left lower extremity more than her right lower extremity.   OPERATION:  1. Complete decompressive lumbar laminectomy at L4-5.  2. Foraminotomies bilaterally at L4-5.  3. Bilateral microdiskectomies at L4-5.   PROCEDURE IN DETAIL:  Under general anesthesia with the patient on  spinal frame, routine orthopedic prep and drape of the lower back was  carried out.  She was given 1 gram of IV Ancef.  At this time two  needles were placed in the back for localization purposes.  X-ray was  taken.  Incision then was made over the L4-5 space.  Bleeders identified  and cauterized.  The muscle then was stripped from the lamina and  spinous processes bilaterally.  Two Kocher clamps were placed on the  spinous processes.  Another x-ray was taken to verify the exact  position.  Following that I then inserted McCullough retractors.  Good  hemostasis was maintained.  I identified the L4 spinous process, removed  the spinous process of 4 with the rongeurs.  Following that I then went  down and did a complete laminectomy.  The microscope was brought in.  I  identified the ligamentum flavum.  I protected the dura.  I then  completed the dissection  down to the dura.  I went out far laterally in  the recesses on both sides in order to expose the nerve roots.  Following that I gently inserted a D'Errico retractor and identified the  disk, the nerve root first, the 5 nerve root and then the disk.  She had  a large herniated disk.  Cruciate incision was made into the disk space  and complete diskectomy was carried out.  Following that I was able to  easily get out into the foramen with a hockey stick.  We did a nice  foraminotomy as well.  We then did exact same thing on the other side.  I went out and decompressed lateral recess, gently retracted the dura  and the root, identified the disk space.  She had another large disk on  the opposite side there as well.  I made a cruciate incision in the  posterior longitudinal ligament and did a complete diskectomy.  Once we  were done both roots now  were free.  We had a good opening as far as the  foramina.  I was able to easily pass a hockey stick out the foramen with  a 5 root on the left as well.  I thoroughly irrigated out  the area.  I injected 10 mL of FloSeal over the dura and I loosely  applied some thrombin soaked Gelfoam.  The wound was closed in layers in  the usual fashion except I left a small deep distal part of the wound  open for drainage purposes.  The subcu was closed with Vicryl, skin with  metal staples.  A sterile Neosporin dressing was applied.           ______________________________  [name], M.D.     RAG/MEDQ  D:  05/14/2008  T:  05/16/2008  Job:  [id number]   cc:   Brenda Quint. Plotnikov, MD  [address]  [place]  [place] [address]

## 2010-12-26 NOTE — Op Note (Signed)
NAMECAROLEENA, [name]                ACCOUNT NO.:  [id number]   MEDICAL RECORD NO.:  [id number]          PATIENT TYPE:  OIB   LOCATION:  9308                          FACILITY:  [facility]   PHYSICIAN:  Duke Salvia. Marcelle Overlie, M.D.DATE OF BIRTH:  [birth year]   DATE OF PROCEDURE:  09/14/2008  DATE OF DISCHARGE:                               OPERATIVE REPORT   PREOPERATIVE DIAGNOSES:  Symptomatic cystocele, rectocele, stress  urinary incontinence.   POSTOPERATIVE DIAGNOSES:  Symptomatic cystocele, rectocele, stress  urinary incontinence.   PROCEDURE:  Anterior and posterior colporrhaphy with posterior pedicle  mesh assisted repair, SUI surgery to be dictated separately by Dr.  Sherron Monday.   SURGEON:  Duke Salvia. Marcelle Overlie, MD   ASSISTANT:  Juluis Mire, MD   ANESTHESIA:  General.   COMPLICATIONS:  None.   DRAINS:  Foley catheter.   BLOOD LOSS:  150.   PROCEDURE AND FINDINGS:  The patient was taken to the operating room  after an adequate level of general anesthesia was obtained with the  patient's legs in stirrups, the perineum and vagina prepped and draped  with Betadine.  The bladder was drained.  Examination revealed a  significant rectocele with mild apical prolapse and a mild grade 1  cystocele.  The procedure was started anteriorly.  The anterior vaginal  mucosa was divided in the midline.  The cystocele was reduced with sharp  and blunt dissection.  Perivesical fascia was then plicated in midline  with 2-0 Vicryl interrupted sutures reducing the cystocele.  Vaginal  mucosa was trimmed and reapproximated with interrupted 2-0 Vicryl  sutures.  Attention directed to the posterior repair.  The posterior  mucosa was infiltrated with 1% Xylocaine with dilute epinephrine  approximately 10 mL followed by another 30-40 mL of saline for  hydrodissection.  A small portion of the perineal skin was removed.  The  posterior vaginal mucosa was divided halfway up the posterior wall.  Using  mainly blunt dissection at that point, the rectocele was reduced  and the ischial spines were palpated and the sacrospinous ligament was  cleaned up of the blunt dissection.  Using the Capio device, the pedicle  graft was then placed by palpating the ischial spine and placing the  Capio 2 cm medial through the full-thickness of the sacrospinous  ligament on each side.  Once this was accomplished, the mesh was  advanced.  The distal portion of the mesh was trimmed and then fixated  with 2-0 Vicryl sutures at the vaginal cuff.  Once this was  accomplished, the arms were then used to advance the mesh elevating for  normal vaginal length and good apical support.  The distal mesh was then  trimmed appropriately and 2 interrupted Vicryl sutures used to fixate  the mesh  distally.  The mesh was under no tension at that point.  The vaginal  mucosa was then closed with interrupted 2-0 Vicryl sutures and the  perineum with 3-0 Vicryl Rapide sutures.  At that point, Dr. Sherron Monday  took over to perform TVT with cystoscopy.      Richard M. Marcelle Overlie, M.D.  Electronically Signed     RMH/MEDQ  D:  09/14/2008  T:  09/14/2008  Job:  [id number]

## 2010-12-26 NOTE — Op Note (Signed)
[name], [name]                ACCOUNT NO.:  [id number]   MEDICAL RECORD NO.:  [id number]          PATIENT TYPE:  OIB   LOCATION:  9308                          FACILITY:  [facility]   PHYSICIAN:  Martina Sinner, MD DATE OF BIRTH:  03-10-1941   DATE OF PROCEDURE:  09/14/2008  DATE OF DISCHARGE:                               OPERATIVE REPORT   PREOPERATIVE DIAGNOSIS:  Stress incontinence.   POSTOPERATIVE DIAGNOSIS:  Stress incontinence.   SURGERY:  Sling cystourethropexy plus cystoscopy.   [name] has stress urinary incontinence.  She had a cystocele  repair and rectocele repair by Dr. Marcelle Overlie.  I scrubbed in following  this.  She had excellent reduction of the defects.   I could easily identify her normal length urethra.  I instilled 7 mL of  lidocaine and epinephrine mixture.  I made a deep incision underneath  the mid urethra.  I sharply dissected the urethrovesical angle  bilaterally.   I made 2 incisions, one fingerbreadth above the symphysis pubis.  They  were 1.5 cm lateral to the midline.  I passed the [facility] needle on top  along the back of symphysis pubis on the pulp of my index finger  bilaterally.  I used my usual technique, keeping laterally.  The bladder  was emptied.  I then cystoscoped the patient.  There was no injury to  the bladder or urethra.  There was excellent efflux bilaterally.  Though, I must say that the blue dye was not that dark.  There was no  indentation of the bladder with movement of the trocars.   The bladder emptied and attached the [facility] sling and brought up to the  retropubic space.  I tensioned over the fat part of a moderate size  Kelly.  I cut below the blue dot to irrigate the sheaths and removed the  sheaths.  I was really happy with the position of the sling and the  hypermobility with no spring back effect.   Irrigation was utilized.  Sling was cut below the skin and skin was  closed with 4-0 Vicryl and Dermabond.  I closed  the anterior vaginal  wall with running 2-0 Vicryl followed by 2 interrupted sutures.  Care  was taken to get a near closure.  Vaginal pack with Estrace cream was  inserted.  Urine was draining well at the end of the case.   Hopefully, this procedure will reach [name]'s treatment goal.           ______________________________  Martina Sinner, MD  Electronically Signed     SAM/MEDQ  D:  09/14/2008  T:  09/14/2008  Job:  [id number]

## 2010-12-26 NOTE — H&P (Signed)
[name], AMENT                ACCOUNT NO.:  [id number]   MEDICAL RECORD NO.:  [id number]          PATIENT TYPE:  AMB   LOCATION:                                FACILITY:  [facility]   PHYSICIAN:  Duke Salvia. Marcelle Overlie, M.D.DATE OF BIRTH:  24-Dec-1940   DATE OF ADMISSION:  09/14/2008  DATE OF DISCHARGE:                              HISTORY & PHYSICAL   CHIEF COMPLAINT:  Symptomatic cystocele and rectocele, SUI.   HISTORY OF PRESENT ILLNESS:  A 70 year old G3, P2, postmenopausal  patient did who has had a prior TVH, anterior repair in 1974.  In 1988,  she had a bladder tack by Dr. Jethro Bolus and was seen by me as a  new GYN patient in June 2009 with complaints of increased vaginal  pressure and SUI.  Urodynamics performed in our office showed a  significant PVR and 140 mL, so she had further evaluation by Dr. Roselee Nova who after repeat testing felt that she may still be improved  by a repeat sling.  Because of the significance of her posterior  cystocele, we had discussed a repeat A and P repair with graft  augmentation with a Pinnacle posterior graft to improve longevity of the  repair.  This procedure including risks related to bleeding, infection,  adjacent organ injury, mesh erosion, expected recovery time all  reviewed.  The planned procedure is anterior and posterior repair with  Pinnacle mesh graft augmentation posteriorly followed by a sling by Dr.  Jacquelyne Balint.   PAST MEDICAL HISTORY:   ALLERGIES:  MACROBID.   CURRENT MEDICATIONS:  1. Baby aspirin.  2. Vitamin D supplements.   PAST SURGICAL HISTORY:  Appendectomy in 1963, TVH/anterior repair in  1974, bladder surgery by Dr. Patsi Sears in 1988, cholecystectomy in  1998, two vaginal deliveries, and she has also had an unspecified neck  surgery.   Her medical doctor is Dr. Macarthur Critchley Plotnikov.   REVIEW OF SYSTEMS:  Significant for a history of IBS, headache, UTI,  arthritis.   FAMILY HISTORY:  Significant for  heart disease.  Her mother, father who  has asthma, sister with thyroid disease, and mother and sister both with  diabetes.   PHYSICAL EXAMINATION:  VITAL SIGNS:  Temperature 98.2, blood pressure  120/72.  HEENT:  Unremarkable.  NECK:  Supple without masses.  LUNGS:  Clear.  CARDIOVASCULAR:  Regular rate and rhythm without murmurs, rubs or  gallops.  BREASTS:  Without masses.  ABDOMEN:  Soft, flat, nontender.  PELVIS:  Normal external genitalia.  The vaginal cuff support was  reasonable.  She had a mild cystocele with a moderate rectocele.  Bimanual otherwise unremarkable.  EXTREMITIES:  Unremarkable.  NEUROLOGIC:  Unremarkable.   IMPRESSION:  1. Symptomatic cystocele/rectocele.  2. Stress urinary incontinence.   PLAN:  Anterior and posterior repair with Pinnacle mesh augmented  posterior repair, Dr. Jacquelyne Balint to perform repeat sling procedure.       Richard M. Marcelle Overlie, M.D.  Electronically Signed     RMH/MEDQ  D:  09/08/2008  T:  09/09/2008  Job:  [id number]

## 2010-12-29 ENCOUNTER — Telehealth: Payer: Self-pay | Admitting: Emergency Medicine

## 2010-12-29 NOTE — Op Note (Signed)
NAMEJANITH, [name]                          ACCOUNT NO.:  [id number]   MEDICAL RECORD NO.:  [id number]                   PATIENT TYPE:  OIB   LOCATION:  2899                                 FACILITY:  [facility]   PHYSICIAN:  Coletta Memos, M.D.                  DATE OF BIRTH:  Feb 16, 1941   DATE OF PROCEDURE:  12/08/2003  DATE OF DISCHARGE:                                 OPERATIVE REPORT   PREOPERATIVE DIAGNOSIS:  1. Displaced disk, left C5-6.  2. Left C6 radiculopathy.   POSTOPERATIVE DIAGNOSIS:  1. Displaced disk, left C5-6.  2. Left C6 radiculopathy.   OPERATION PERFORMED:  Anterior cervical decompression, C5-6.  Arthrodesis,  C5-6.  Anterior plating, C5-6 with Synthes small stature plate using a 7 mm  Synthes bone graft.   SURGEON:  Coletta Memos, M.D.   ASSISTANT:   ANESTHESIA:  General.   COMPLICATIONS:  None.   INDICATIONS FOR PROCEDURE:  [name] is a 70 year old woman who  presented with severe pain in the left upper extremity.  MRI showed a  displaced disk on the left side at C5-6.  I therefore recommended and she  agreed to undergo operative decompression.   DESCRIPTION OF PROCEDURE:  [name] was taken to the operating room  intubated and placed under general anesthetic without difficulty.  The head  was positioned in slight extension in a horseshoe head rest.  The neck was  prepped and she was draped in sterile fashion.  I infiltrated 3 mL 0.5%  lidocaine 1:200,000 strength epinephrine into the cervical region starting  at the midline extending to the medial border of the left  sternocleidomastoid.  I opened the skin with a #10 blade and took this down  to the platysma.  I dissected rostrally and caudally above the platysma.  I  opened the platysma in a horizontal fashion.  I then dissected inferior to  that rostrally and caudally.  I then identified an avascular plane to the  anterior cervical spine.  Using handheld retractors I was able to clear that  area.  I placed a spinal needle and that showed that I was at C5-6.  I then  reflected the longus colli muscles bilaterally.  I placed self-retaining  retractor at that level.  I then placed distraction pins, one at C5, the  other one at C6.  I distracted the disk space.  I then used a 15 blade to  open the disk space.  Using pituitary rongeurs, curets and Kerrison punches  I removed the disk material with Dr. Verlee Rossetti assistance.  I was able to  fully decompress both C6 nerve roots.  I then placed a 7 mm bone graft after  shaping the end plates with a high speed air drill.  I removed the  distraction and then the distraction pins.  The weight was taken off the  neck which had been applied to  place her in 10 pounds of traction via chin  strap prior to starting the case.  I then placed the plate and four screws,  two at C5, two at C6 first by drilling and tapping, then placing 12 mm  screws.  Locking screws were placed.  I  irrigated the wound.  X-ray was performed showing that the plate and plugs  were in good position.  I then irrigated again.  I then closed the wound in  layered fashion, reapproximating the platysma, subcutaneous tissues. I used  Dermabond for sterile dressing.  The patient was awake and moving all  extremities postoperatively.                                               Coletta Memos, M.D.    KC/MEDQ  D:  12/08/2003  T:  12/08/2003  Job:  [id number]

## 2010-12-29 NOTE — Discharge Summary (Signed)
[name], [name]                ACCOUNT NO.:  [id number]   MEDICAL RECORD NO.:  [id number]          PATIENT TYPE:  OIB   LOCATION:  9308                          FACILITY:  [facility]   PHYSICIAN:  Duke Salvia. Marcelle Overlie, M.D.DATE OF BIRTH:  1941/01/16   DATE OF ADMISSION:  09/14/2008  DATE OF DISCHARGE:  09/16/2008                               DISCHARGE SUMMARY   DISCHARGE DIAGNOSES:  1. Symptomatic cystocele and rectocele.  2. Stress urinary incontinence.  3. Anterior and posterior repair with posterior mesh-assisted repair,      SPARC sling per Dr. Sherron Monday.   SUMMARY OF HISTORY AND PHYSICAL EXAM:  Please see admission H and P for  details.  Briefly, a 70 year old G3, P2, postmenopausal patient with  prior TVH and anterior repair in 1974.  She has also had a previous  bladder suspension surgery by Dr.  Jethro Bolus.  Due to continued  problems with increased pelvic pressure and recurrent SUI, she presents  for repeat sling by Dr. Sherron Monday, A and P repair with graft  augmentation on the posterior side.   HOSPITAL COURSE:  On February 2 under general anesthesia, the patient  underwent A and P repair with posterior pedicle graft and TV T per Dr.  Sherron Monday.  On the first postop day, the catheter was removed.  She was  able to void without difficulty.  Because of her previous history of  postoperative PE was started on subcu Lovenox 40 mg daily at that point.  By February 4, she was afebrile, tolerating regular diet, voiding  without difficulty, and was ready for discharge.   LABORATORY DATA:  Preop CBC, WBC 11.6, hemoglobin 13.5, hematocrit 39.2,  platelets 361,000.  Postop CBC and September 15, 2008, WBC 8.0, hemoglobin  11.1, hematocrit 31.6.  Factor V Leiden was negative.  T3 was normal and  94%.  PTT-LA was a normal.  LA not detected.  Protein C, S were normal.  CMET on admission, preop was normal.  Homocysteine was in the normal  range.  Anti-cardiolipin IgG was negative,  anti-cardiolipin IgM  negative, and anti-cardiolipin IgA was positive at 51.  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  Beta II glycoprotein antibodies normal.  Prothrombin gene mutation II negative.   DISPOSITION:  The patient is discharged on Tylox p.r.n. pain.  Will  resume her estrogen vaginal cream 3 nights weekly, return to my office  in 7-10 days also to see Dr. Sherron Monday in 7-10 days, continue her  Lovenox 40 mg subcu once daily.  Advised to report any vaginal bleeding,  difficulties with voiding, constipation, fever over 101.  She was given  specific instructions regarding diet, sex, exercise.   CONDITION:  Good.   ACTIVITY:  Gradually increase after home.      Richard M. Marcelle Overlie, M.D.  Electronically Signed     [facility]/MEDQ  D:  10/08/2008  T:  10/09/2008  Job:  [id number]

## 2010-12-29 NOTE — H&P (Signed)
[place]. [facility]  Patient:    [name], [name]                       MRN: [id number] Adm. Date:  [id number] Disc. Date: [id number] Attending:  Howell Pringle Dictator:   Anselm Lis, N.P.                         History and Physical  DATE OF BIRTH:  [birth year]  PROBLEMS:  (As dictated by Dr. Verdis Prime) 1. Chest pain syndrome; prior cardiac catheterization September 2000 revealing    normal coronary arteries; 30% proximal circumflex plaque.  Normal LV    function. Enrolled in REVERSAL trial at that time; now for follow-up    coronary angiography to fulfill requirements of that study.  Normal LV    function at that time.  The patient did have an episode of anterior    chest pressure with shortness of breath relieved with sublingual    nitrates about two weeks earlier and a left arm discomfort last week which    did not appear musculoskeletal. 2. Hyperlipidemia; prior enrollment in REVERSAL trial which is ongoing. 3. Tobacco abuse; two packs per day since age 44. 4. History of hiatal hernia, GERD. 5. Laparoscopic cholecystectomy September 2000; history of hysterectomy and    appendectomy. 6. Gastric ulcer age 81. 7. Irritable bowel syndrome. 8. Glaucoma. 9. History of pulmonary emboli.  PLAN:  (As dictated by Dr. Verdis Prime) For a coronary angiography as follow up in participation in REVERSAL trial.  Risks, potential complications, benefits, and nature of procedure discussed in detail.  Ms. Nathanial Rancher indicates her questions and concerns have been addressed and is agreeable to proceed.  SOCIAL HISTORY AND HABITS:  Tobacco - one to two packs per day since age 36. ETOH - negative.  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  She is remarried to the same man, has two supportive daughters.  FAMILY HISTORY:  Mother died age 68 after bypass surgery.   Did have diabetes and multiple MIs, first at age 70.  Father died of lung cancer at age 68. Patient has a 55 year old sister who has carotid stenosis and TIAs.  ALLERGIES:  Episodically to CODEINE but not if she takes Benadryl with this. She has some pruritus occasionally.  MEDICATIONS:  REVERSAL study drug, Zantac 75 mg p.o. q.d., Premarin q.d.  REVIEW OF SYSTEMS:  Wears glasses.  Slight earache left ear last few days, better today.  Does have some dysphagia with swallowing pills for about six months.  Told to follow up with Dr. Arlyce Dice on this.  Episodic constipation / diarrhea consistent with irritable bowel syndrome.  Arthritic - type complaints affecting her hands and ankles.  Denies orthopnea, pedal edema, nor PND.  Negative melena nor bright red blood PR.  Negative dysuria nor hematuria.  PHYSICAL EXAMINATION:  (As performed by Dr. Verdis Prime)  VITAL SIGNS:  Pending.  GENERAL:  She is an overweight, middle-aged female in no acute distress.  HEENT:  Muted left carotid upstroke, right okay.  Negative bruit.  Negative significant JVD, though difficult exam secondary to neck size.  CHEST:  Some scant bibasilar crackles, no wheezes.  CARDIAC:  Regular rate and rhythm without murmur, rub, nor gallop.  Normal S1 and S2.  ABDOMEN:  Soft, obese, normoactive bowel sounds.  Negative abdominal aorta, no renal nor femoral bruit.  EXTREMITIES:  Distal pulses intact.  Negative pedal edema.  NEUROLOGIC:  Cranial nerves 2-12 are grossly intact.  Neurologic exam nonfocal.  Alert and oriented x 3.  GENITAL/RECTAL:  Deferred.  LABORATORY TESTS AND DATA:  Chest x-ray, CBC, CMET, and EKG are in process and are pending. DD:  10/30/00 TD:  10/30/00 Job: [id number]

## 2010-12-29 NOTE — Telephone Encounter (Signed)
Pt last seen by RB on 08/23/2010 for SOB, PE and RA. CT chest done 12/18/2010. Will forward msg to RB.

## 2010-12-29 NOTE — Telephone Encounter (Signed)
Discussed CT scan with DR Nickola Major today. Shows some mild subplural interstitial changes at both bases. ? Related to her RA or her MTX. Not enopugh to explain her SOB, but will need to be followed w serial films.

## 2010-12-29 NOTE — Cardiovascular Report (Signed)
[place]. [facility]  Patient:    [name], [name]                       MRN: [id number] Proc. Date: 10/30/00 Adm. Date:  [id number] Disc. Date: [id number] Attending:  Lyn Records. Iii CC:         Jethro Bastos, M.D.  [facility]   Cardiac Catheterization  INDICATIONS:  REVERSAL study followup.  PROCEDURES PERFORMED: 1. Left heart catheterization. 2. Selective coronary angiography. 3. Left ventriculography. 4. Intravascular ultrasound with the Discovery intravascular ultrasound    catheter.  DESCRIPTION OF PROCEDURE:  After informed consent, a 6 French sheath was placed in the right femoral artery using a modified Seldinger technique.  A 6 French A2 multipurpose catheter was then used for hemodynamic recordings, left ventriculography by hand injection, and selective left and right coronary angiography.  After appropriate orthogonal angiographic images were obtained, intravascular ultrasound was then performed in the RAO 2 by caudle 26 degree angulation.  We used a 7 [place] #4 left Judkins guide catheter, a BMW wire, and the Discovery intravascular ultrasound catheter.  An intravascular ultrasound imaging run was performed from the mid circumflex back to the left main.  Coronary angiography was performed after the patient received 100 mcg of intracoronary nitroglycerin in the left coronary system.  The patient tolerated the procedure without complications.  She did have chest discomfort towards the latter stages of the IVUS run.  She received 3000 units of IV heparin.  The ACT postprocedure was 170 seconds.  RESULTS:  I:  HEMODYNAMIC DATA:     a. The aortic pressure 131/74 mmHg.     b. Left ventricular pressure 131/11 mmHg.  II:  LEFT VENTRICULOGRAPHY:  The left ventricle is normal in size and demonstrates normal overall contractility.  No mitral regurgitation is noted. EF is 70%.  III:  SELECTIVE CORONARY ANGIOGRAPHY:    a. Left main:  The left main coronary artery is normal.     b. Left anterior descending coronary artery:  The LAD is a large vessel        that gives origin to two diagonal branches.  The first diagonal is        small.  The second is large arising from the mid vessel.  The entire        LAD system is essentially normal.  There is mild plaquing just distal        to the large second diagonal branch.     c. Circumflex artery:  The circumflex artery is dominantly one obtuse        marginal branch.  There is plaque in the proximal third and ostial        region of the obtuse marginal.  The vessel is otherwise entirely        normal.     d. Right coronary artery:  The right coronary artery is a large dominant        vessel giving origin to a large PDA and two left ventricular branches.        No abnormalities are noted.  IV:  INTRAVASCULAR ULTRASOUND:  Intravascular ultrasound was performed from the distal third of the obtuse marginal/circumflex all the way back to the left main ostium.  Minimal plaque was noted except in the proximal/ostial region where there was eccentric plaque noted.  CONCLUSIONS: 1. Essentially angiographic normal coronaries with the exception of mild    evidence of  angiographic obstruction in the ostial portion of the    circumflex, perhaps obstructing the vessel up to 25%. 2. Normal left ventricular function. 3. Successful completion of intravascular ultrasound for the REVERSAL    study.  PLAN:  The patient underwent Perclose arteriotomy closure without complications.  Ancef 1 g was given.  She will ambulate and be discharged later today. DD:  10/30/00 TD:  10/31/00 Job: [id number]

## 2010-12-29 NOTE — Discharge Summary (Signed)
[name]                ACCOUNT NO.:  [id number]   MEDICAL RECORD NO.:  [id number]          PATIENT TYPE:  INP   LOCATION:  1530                         [facility]ACILITY:  [facility]   PHYSICIAN:  Georges Lynch. Gioffre, M.D.DATE OF BIRTH:  [birth year]   DATE OF ADMISSION:  05/16/2008  DATE OF DISCHARGE:  05/17/2008                               DISCHARGE SUMMARY   ADMISSION DIAGNOSES:  1. 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  2. Gastroesophageal reflux disease.  3. Hypertension.  4. Preoperative urinary tract infection, started on Cipro.  5. Obesity.  6. History of deep vein thrombosis.   DISCHARGE DIAGNOSES:  1. Status post complete decompressive lumbar laminectomy at L4-5 with      bilateral microdiskectomy at L4-5.  2. Urinary tract infection.  3. Reflux disease.  4. Obesity.  5. History of deep vein thrombosis.   HISTORY OF PRESENT ILLNESS:  Patient is a 70 year old female with lower  back pain with left lower lobe extremity pain with some minimal pain  into the right lower extremity.  The patient had an MRI that did not  show much changes, but CT myelogram showed that she had a complete block  at L4-5 with a large central herniated disk affecting both right and  left.  The patient has elected to proceed with the surgical correction.   MEDICATIONS ON ADMISSION:  1. Aspirin.  2. Naprosyn.  3. Omeprazole.  4. Vicodin.  5. Valium.  6. Amlodipine.  7. Cipro.   ALLERGIES:  MACROBID.   SURGICAL PROCEDURES:  On May 14, 2008, the patient was taken to the  OR by Dr. Worthy Rancher, assisted by Dr. Meredith Staggers.  Under general  anesthesia, the patient had a complete decompressive lumbar laminectomy  at L4-5 with bilateral microdiskectomy at L4-5.  The patient had no  complications.  Estimated blood loss 200 ml.  The patient was  transferred to the recovery room in good condition.   CONSULTS:  The following routine consults requested physical  therapy,  case management.   HOSPITAL COURSE:  On May 14, 2008, the patient was taken to the OR by  Dr. Worthy Rancher.  The patient had a complete decompressive lumbar  laminectomy at L4-5 with a bilateral microdiskectomy at L4-5.  The  patient tolerated the procedure well.  There were no complications.  The  patient was transferred recovery room, then transferred to hospital  floor in good condition.  Follow back protocol.  The patient spent two  days on the orthopedic floor in which the patient's vital signs remained  stable.  Postop day #1, the patient remained very comfortable.  She had  less back pain.  She was neurologically intact in the lower extremities.  She was started to be mobilized with physical therapy.  Her wound  dressing was intact.  The patient did have some slight weakness on the  extensor halluces longus on the left.  On postop day #2, the patient had  a little increase in her discomfort in her legs and her back.  She was  motor intact.  Sensory:  She had slight decreased sensation on the left.  Her wound was benign, looked very good.  Dr. Darrelyn Hillock thought it was post  action irritation due to the surgical procedure.  He placed her on a  Medrol Dosepak.  She continued with physical therapy, and he discharge  her for routine follow-up.   LABORATORY DATA:  CBC on admission found WBCs 5.7, hemoglobin 12.5,  hematocrit 36.5, platelets 243.  On date of discharge, her H&H was 10.5  and 30.4.  Routine chemistries on admission within normal limits.  Urinalysis did show she had many bacteria with a urine culture that  showed greater than 100,000 Klebsiella.  She was placed preoperatively  on Cipro.  EKG was normal sinus rhythm at 79 beats per minute  preoperatively.   DISCHARGE INSTRUCTIONS:  1. Diet:  No restrictions.  2. Activity:  The patient is to slowly increase activity.  She may      walk up steps.  She is to continue to use a walker.  She is not to      lift  greater than 10 pounds.  She is to keep her incision dry, but      she may shower.  The patient is to change dressing on a daily      basis.   FOLLOW UP:  The patient will need follow-up appointment with Dr. Darrelyn Hillock  around October 16.  The patient is to call for that follow-up  appointment at [phone].   MEDICATIONS:  1. Percocet 10/650 one tablet every 4-6 hours for pain if needed.  2. Robaxin 500 mg once every 6 hours for pain if needed.  3. The patient is to continue with prednisone dose pack as provided in      her hospitalization.  4. Omeprazole 40 mg a day.  5. Diazepam 5 mg a day.  6. Dilaudid 2 mg to be placed on hold while using Percocet.  7. Vitamin D3 1000 mg once a day.  8. Aspirin 81 mg once a day.  9. Naprosyn and Vicodin, to be placed on hold until done.  Hospital      provided prescriptions.   CONDITION ON DISCHARGE:  The patient's condition upon discharge to home  is listed improved and good.      Jamelle Rushing, P.A.    ______________________________  Georges Lynch Darrelyn Hillock, M.D.    RWK/MEDQ  D:  06/02/2008  T:  06/02/2008  Job:  [id number]

## 2010-12-30 ENCOUNTER — Emergency Department (HOSPITAL_COMMUNITY): Payer: Medicare Other

## 2010-12-30 ENCOUNTER — Emergency Department (HOSPITAL_COMMUNITY)
Admission: EM | Admit: 2010-12-30 | Discharge: 2010-12-31 | Disposition: A | Discharge status: Home / Self Care | Payer: Medicare Other | Attending: Emergency Medicine | Admitting: Emergency Medicine

## 2010-12-30 DIAGNOSIS — R109 Unspecified abdominal pain: Secondary | ICD-10-CM | POA: Insufficient documentation

## 2010-12-30 DIAGNOSIS — R0602 Shortness of breath: Secondary | ICD-10-CM | POA: Insufficient documentation

## 2010-12-30 DIAGNOSIS — S301XXA Contusion of abdominal wall, initial encounter: Secondary | ICD-10-CM | POA: Insufficient documentation

## 2010-12-30 DIAGNOSIS — R079 Chest pain, unspecified: Secondary | ICD-10-CM | POA: Insufficient documentation

## 2010-12-30 DIAGNOSIS — R112 Nausea with vomiting, unspecified: Secondary | ICD-10-CM | POA: Insufficient documentation

## 2010-12-30 DIAGNOSIS — Z9089 Acquired absence of other organs: Secondary | ICD-10-CM | POA: Insufficient documentation

## 2010-12-30 DIAGNOSIS — Z90710 Acquired absence of both cervix and uterus: Secondary | ICD-10-CM | POA: Insufficient documentation

## 2010-12-30 DIAGNOSIS — R197 Diarrhea, unspecified: Secondary | ICD-10-CM | POA: Insufficient documentation

## 2010-12-30 DIAGNOSIS — W1809XA Striking against other object with subsequent fall, initial encounter: Secondary | ICD-10-CM | POA: Insufficient documentation

## 2010-12-30 DIAGNOSIS — Y92009 Unspecified place in unspecified non-institutional (private) residence as the place of occurrence of the external cause: Secondary | ICD-10-CM | POA: Insufficient documentation

## 2010-12-30 LAB — COMPREHENSIVE METABOLIC PANEL
Albumin: 3.8 g/dL (ref 3.5–5.2)
BUN: 9 mg/dL (ref 6–23)
Chloride: 101 mEq/L (ref 96–112)
Creatinine, Ser: 0.77 mg/dL (ref 0.4–1.2)
GFR calc non Af Amer: >60 mL/min (ref >60)
Glucose, Bld: 102 mg/dL — ABNORMAL HIGH (ref 70–99)
Total Bilirubin: 0.3 mg/dL (ref 0.3–1.2)

## 2010-12-30 LAB — DIFFERENTIAL
Eosinophils Absolute: 0.3 10*3/uL (ref 0.0–0.7)
Eosinophils Relative: 2 % (ref 0–5)
Lymphs Abs: 0.9 10*3/uL (ref 0.7–4.0)
Monocytes Relative: 4 % (ref 3–12)
Neutrophils Relative %: 86 % — ABNORMAL HIGH (ref 43–77)

## 2010-12-30 LAB — CBC
MCH: 28.7 pg (ref 26.0–34.0)
MCV: 84.8 fL (ref 78.0–100.0)
Platelets: 255 10*3/uL (ref 150–400)
RBC: 4.21 MIL/uL (ref 3.87–5.11)

## 2010-12-30 LAB — LIPASE, BLOOD: Lipase: 22 U/L (ref 11–59)

## 2010-12-31 MED ORDER — IOHEXOL 300 MG/ML  SOLN
125.0000 mL | Freq: Once | INTRAMUSCULAR | Status: AC | PRN
  Administered 2010-12-31: 125 mL via INTRAVENOUS

## 2011-01-02 ENCOUNTER — Ambulatory Visit (INDEPENDENT_AMBULATORY_CARE_PROVIDER_SITE_OTHER): Payer: Medicare Other | Admitting: Emergency Medicine

## 2011-01-02 ENCOUNTER — Encounter: Payer: Self-pay | Admitting: Emergency Medicine

## 2011-01-02 DIAGNOSIS — R0602 Shortness of breath: Secondary | ICD-10-CM

## 2011-01-02 DIAGNOSIS — J449 Chronic obstructive pulmonary disease, unspecified: Secondary | ICD-10-CM

## 2011-01-02 MED ORDER — DOXYCYCLINE HYCLATE 100 MG PO CAPS: 100.0000 mg | ORAL_CAPSULE | Freq: Two times a day (BID) | ORAL | Status: AC

## 2011-01-02 NOTE — Assessment & Plan Note (Signed)
With possible bronchitis - rx with doxycycline x 7 days.

## 2011-01-02 NOTE — Progress Notes (Signed)
  Subjective:    Patient ID: [name], female    DOB: 1940-12-16, 70 y.o.   MRN: [id number]  HPI 70 yo former smoker, hx carotid disease, RA diagnosed by Dr Nickola Major in June '11, started on MTX in 7/11.  No evidence CAD by myoview in 5/10. Also hx PE in 1988 after a bladder surgery, anti-coag x 16mo.  Referred by Dr Cliffton Asters to evaluate SOB. She tells me that her breathing had some minor limitations up to 2 yrs ago. This improved some when she was treated for Vit D and B12 deficiency. Then became much more bothersome Summer 2010 when she started Prednisone, followed by MTX for her RA. She has gained about 30 lbs since the Summer. She believes that the SOB relates to the initiation of these meds. She was started on Spiriva 12/11 ago - no difference in her breathing.   ROV 08/23/10 -- Hx tobacco, follows up for evaluation of SOB. We performed CBC that showed no anemia, CXR without evidence for ILD. Tells me that MTX was stopped last Friday, still on pred. Having some nasal congestion today. Her breathing is still difficult w exertion. We stopped Spiriva last time, she doesn't miss it. Not on any other BDs.   ROV 01/03/11 -- hx tobacco, RA on MTX. CAD being followed by Dr Myrtis Ser. L and R cath in 2/12 showed CAD, normal LV and RV pressures. Has restriction vs mixed disease - did not improve with Spiriva and albuterol. Has been off prednisone since January. She had a CT scan chest that showed very subtle subpleural interstitial changes.    Review of Systems  as per HPI    Objective:   Physical Exam  Gen: Pleasant, obese, in no distress,  normal affect  ENT: No lesions,  mouth clear,  oropharynx clear, no postnasal drip  Neck: No JVD, no TMG, no carotid bruits  Lungs: R basilar insp crackles, L clear  Cardiovascular: RRR, heart sounds normal, no murmur or gallops, 1+ LE edema  Abdomen: obese, tender R mid epigastrium  Musculoskeletal: No deformities, no cyanosis or clubbing  Neuro: alert,  non focal  Skin: Warm, no lesions or rashes  .      Assessment & Plan:  COPD (chronic obstructive pulmonary disease) With possible bronchitis - rx with doxycycline x 7 days.   Shortness of breath PFT with mixed disease but no response to BD's/. Suspect this is largely deconditioning. 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  - repeat Ct scan in 6 months to insure no changes. If so may need to stop the MTX - encouraged exercise and building up her conditioning.  - diuretics as directed by Dr Myrtis Ser

## 2011-01-02 NOTE — Patient Instructions (Signed)
Take doxycycline for 7 days as directed We will repeat your CT scan of the chest in 6 months.  Follow up with Dr Delton Coombes in 6 months to review the results of your scan

## 2011-01-02 NOTE — Assessment & Plan Note (Signed)
PFT with mixed disease but no response to BD's/. Suspect this is largely deconditioning. 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  - repeat Ct scan in 6 months to insure no changes. If so may need to stop the MTX - encouraged exercise and building up her conditioning.  - diuretics as directed by Dr Myrtis Ser

## 2011-01-03 ENCOUNTER — Other Ambulatory Visit: Payer: Self-pay | Admitting: Cardiology

## 2011-01-03 DIAGNOSIS — I6529 Occlusion and stenosis of unspecified carotid artery: Secondary | ICD-10-CM

## 2011-01-04 ENCOUNTER — Encounter (INDEPENDENT_AMBULATORY_CARE_PROVIDER_SITE_OTHER): Payer: Medicare Other | Admitting: *Deleted

## 2011-01-04 DIAGNOSIS — I6529 Occlusion and stenosis of unspecified carotid artery: Secondary | ICD-10-CM

## 2011-01-05 ENCOUNTER — Encounter: Payer: Self-pay | Admitting: Cardiology

## 2011-05-15 LAB — CREATININE, SERUM
Creatinine, Ser: 0.86
GFR calc non Af Amer: >60

## 2011-05-15 LAB — URINALYSIS, ROUTINE W REFLEX MICROSCOPIC
Glucose, UA: NEGATIVE
pH: 6

## 2011-05-15 LAB — CBC
MCHC: 34.3
Platelets: 243
RBC: 4.09
WBC: 5.7

## 2011-05-15 LAB — COMPREHENSIVE METABOLIC PANEL
ALT: 17
AST: 18
Albumin: 3.6
CO2: 29
Calcium: 9.2
GFR calc Af Amer: >60
GFR calc non Af Amer: >60
Sodium: 143

## 2011-05-15 LAB — HEMOGLOBIN AND HEMATOCRIT, BLOOD
HCT: 30.4 — ABNORMAL LOW
Hemoglobin: 10.5 — ABNORMAL LOW

## 2011-05-15 LAB — DIFFERENTIAL
Eosinophils Absolute: 0.2
Eosinophils Relative: 4
Lymphs Abs: 1.9
Monocytes Absolute: 0.4
Monocytes Relative: 7

## 2011-05-15 LAB — URINE CULTURE: Colony Count: >=100000

## 2011-06-01 ENCOUNTER — Emergency Department (HOSPITAL_COMMUNITY): Payer: Medicare Other

## 2011-06-01 ENCOUNTER — Telehealth: Payer: Self-pay | Admitting: Cardiology

## 2011-06-01 ENCOUNTER — Inpatient Hospital Stay (HOSPITAL_COMMUNITY)
Admission: EM | Admit: 2011-06-01 | Discharge: 2011-06-04 | DRG: 308 | Disposition: A | Discharge status: Home / Self Care | Payer: Medicare Other | Attending: Cardiology | Admitting: Cardiology

## 2011-06-01 DIAGNOSIS — I2510 Atherosclerotic heart disease of native coronary artery without angina pectoris: Secondary | ICD-10-CM | POA: Diagnosis present

## 2011-06-01 DIAGNOSIS — M549 Dorsalgia, unspecified: Secondary | ICD-10-CM | POA: Diagnosis present

## 2011-06-01 DIAGNOSIS — Z7982 Long term (current) use of aspirin: Secondary | ICD-10-CM

## 2011-06-01 DIAGNOSIS — E876 Hypokalemia: Secondary | ICD-10-CM | POA: Diagnosis present

## 2011-06-01 DIAGNOSIS — M069 Rheumatoid arthritis, unspecified: Secondary | ICD-10-CM | POA: Diagnosis present

## 2011-06-01 DIAGNOSIS — R079 Chest pain, unspecified: Secondary | ICD-10-CM

## 2011-06-01 DIAGNOSIS — I4949 Other premature depolarization: Principal | ICD-10-CM | POA: Diagnosis present

## 2011-06-01 DIAGNOSIS — I509 Heart failure, unspecified: Secondary | ICD-10-CM | POA: Diagnosis present

## 2011-06-01 DIAGNOSIS — J4489 Other specified chronic obstructive pulmonary disease: Secondary | ICD-10-CM | POA: Diagnosis present

## 2011-06-01 DIAGNOSIS — I5031 Acute diastolic (congestive) heart failure: Secondary | ICD-10-CM | POA: Diagnosis present

## 2011-06-01 DIAGNOSIS — J449 Chronic obstructive pulmonary disease, unspecified: Secondary | ICD-10-CM | POA: Diagnosis present

## 2011-06-01 DIAGNOSIS — Z87891 Personal history of nicotine dependence: Secondary | ICD-10-CM

## 2011-06-01 DIAGNOSIS — Z86711 Personal history of pulmonary embolism: Secondary | ICD-10-CM

## 2011-06-01 DIAGNOSIS — K219 Gastro-esophageal reflux disease without esophagitis: Secondary | ICD-10-CM | POA: Diagnosis present

## 2011-06-01 LAB — CK TOTAL AND CKMB (NOT AT ARMC)
CK, MB: 4.7 ng/mL — ABNORMAL HIGH (ref 0.3–4.0)
Relative Index: INVALID (ref 0.0–2.5)
Total CK: 40 U/L (ref 7–177)

## 2011-06-01 LAB — POCT I-STAT TROPONIN I: Troponin i, poc: 0.02 ng/mL (ref 0.00–0.08)

## 2011-06-01 LAB — POCT I-STAT, CHEM 8
BUN: 16 mg/dL (ref 6–23)
Calcium, Ion: 1.14 mmol/L (ref 1.12–1.32)
TCO2: 24 mmol/L (ref 0–100)

## 2011-06-01 LAB — CBC
HCT: 35.4 % — ABNORMAL LOW (ref 36.0–46.0)
MCH: 30.3 pg (ref 26.0–34.0)
MCHC: 35.3 g/dL (ref 30.0–36.0)
Platelets: 251 10*3/uL (ref 150–400)
Platelets: 254 10*3/uL (ref 150–400)
RBC: 4.06 MIL/uL (ref 3.87–5.11)
RDW: 15.4 % (ref 11.5–15.5)
RDW: 15.5 % (ref 11.5–15.5)
WBC: 8 10*3/uL (ref 4.0–10.5)

## 2011-06-01 LAB — URINALYSIS, ROUTINE W REFLEX MICROSCOPIC
Bilirubin Urine: NEGATIVE
Hgb urine dipstick: NEGATIVE
Specific Gravity, Urine: 1.023 (ref 1.005–1.030)
pH: 5.5 (ref 5.0–8.0)

## 2011-06-01 LAB — COMPREHENSIVE METABOLIC PANEL
ALT: 9 U/L (ref 0–35)
AST: 12 U/L (ref 0–37)
Albumin: 3.6 g/dL (ref 3.5–5.2)
Calcium: 9.4 mg/dL (ref 8.4–10.5)
Sodium: 139 mEq/L (ref 135–145)
Total Protein: 6.3 g/dL (ref 6.0–8.3)

## 2011-06-01 LAB — D-DIMER, QUANTITATIVE: D-Dimer, Quant: 0.28 ug/mL-FEU (ref 0.00–0.48)

## 2011-06-01 LAB — DIFFERENTIAL
Basophils Relative: 1 % (ref 0–1)
Eosinophils Absolute: 0.4 10*3/uL (ref 0.0–0.7)
Monocytes Relative: 10 % (ref 3–12)
Neutrophils Relative %: 66 % (ref 43–77)

## 2011-06-01 LAB — TSH: TSH: 0.963 u[IU]/mL (ref 0.350–4.500)

## 2011-06-01 LAB — PROTIME-INR: Prothrombin Time: 13.7 seconds (ref 11.6–15.2)

## 2011-06-01 NOTE — Telephone Encounter (Signed)
PER DR KATZ PT NEEDS TO GO TO ER FOR EVALUATION AND TX  IF DOES NOT WANT TO GO BY EMS  THEN NEEDS TO HAVE SOMEONE DRIVE HER .ATTEMPTED TO CALL PT N/A  AT THIS TIME .Zack Seal

## 2011-06-01 NOTE — Telephone Encounter (Signed)
Pt woke up with pain and pressure Wed am around 3:00am.  It lasted about 20-30 minutes.  Pain was dull and heavy.  She took ntg and it was relieved by two ntg.  She was also nauseated.  She has had a lot of exertional sweating since the chest pain. Today she is very weak.  She has been getting weaker daily since the pain.  She has had sob since the episode of chest pain.  The weakness is worse with slight exertion.  I told pt she needs to call 911 and go to the ER.  Pt states she is not going to call 911 but will go to the ER.  I repeated that she needs to call 911 and let them take her to the ER.

## 2011-06-01 NOTE — Telephone Encounter (Signed)
Pt had chest pain and pressure on Weds.  Felt like elephant on chest, headache, nausea.  This feeling lasted from 3 am for quite a while.  She took 2 Nitro and got some relief.  Since then she is completely drained and no energy.  Had a bad headache yesterday.  She is weak, short of breath and just does not feel well at all.

## 2011-06-01 NOTE — Telephone Encounter (Signed)
Agree 

## 2011-06-02 LAB — CBC
Hemoglobin: 12 g/dL (ref 12.0–15.0)
MCH: 29.6 pg (ref 26.0–34.0)
MCHC: 34.4 g/dL (ref 30.0–36.0)
RDW: 15.4 % (ref 11.5–15.5)

## 2011-06-02 LAB — CARDIAC PANEL(CRET KIN+CKTOT+MB+TROPI)
CK, MB: 3.7 ng/mL (ref 0.3–4.0)
CK, MB: 3.5 ng/mL (ref 0.3–4.0)
Relative Index: INVALID (ref 0.0–2.5)
Troponin I: <0.3 ng/mL (ref <0.30)

## 2011-06-02 LAB — BASIC METABOLIC PANEL
Calcium: 8.7 mg/dL (ref 8.4–10.5)
GFR calc non Af Amer: 65 mL/min — ABNORMAL LOW (ref >90)
Glucose, Bld: 100 mg/dL — ABNORMAL HIGH (ref 70–99)
Sodium: 142 mEq/L (ref 135–145)

## 2011-06-03 ENCOUNTER — Inpatient Hospital Stay (HOSPITAL_COMMUNITY): Payer: Medicare Other

## 2011-06-03 DIAGNOSIS — R5383 Other fatigue: Secondary | ICD-10-CM

## 2011-06-03 DIAGNOSIS — R5381 Other malaise: Secondary | ICD-10-CM

## 2011-06-03 LAB — BASIC METABOLIC PANEL
BUN: 17 mg/dL (ref 6–23)
Chloride: 104 mEq/L (ref 96–112)
Glucose, Bld: 102 mg/dL — ABNORMAL HIGH (ref 70–99)
Potassium: 4.3 mEq/L (ref 3.5–5.1)

## 2011-06-04 DIAGNOSIS — R0602 Shortness of breath: Secondary | ICD-10-CM

## 2011-06-06 NOTE — H&P (Addendum)
NAMESATYA, [name]                ACCOUNT NO.:  [id number]  MEDICAL RECORD NO.:  [id number]  LOCATION:  3735                         FACILITY:  [facility]  PHYSICIAN:  Marca Ancona, MD      DATE OF BIRTH:  07-Aug-1941  DATE OF ADMISSION:  06/01/2011 DATE OF DISCHARGE:                             HISTORY & PHYSICAL   PRIMARY CARDIOLOGIST:  Luis Abed, MD, [facility]  PRIMARY MEDICAL DOCTOR:  Dr. Cliffton Asters.  CHIEF COMPLAINT:  Chest pain and weakness.  HISTORY OF PRESENT ILLNESS:  [name] is a 70 year old female with somewhat of a complicated past medical history.  She has a history of rheumatoid arthritis, GERD, hiatal hernia, who has had issues with chronic dyspnea on exertion.  She had a catheterization in February of this year.  It demonstrated normal left and right heart pressures and no obstructive coronary artery disease.  She has also had pulmonology evaluation with no clear-cut picture, possibly COPD, with mixed abnormalities on PFTs.  She presented with multiple complaints, culminating and her feeling poorly for the last few days.  Wednesday at 3 o'clock in the morning, she woke up at 3:00 am with chest pain/pressure and 2 sublingual nitroglycerin with gradual release. However, afterwards she developed nausea and weakness.  She also notes for the last several days, she has had significant shortness of breath and dyspnea on exertion with any amounts of activity including ADL that is also associated with diaphoresis.  The patient also describes intermittent sensation of elevated/irregular heart rates.  She has a heart monitor at home and has noted that sometimes when she is feeling this way, her monitor shows a heart rate greater than 100, although she is not entirely sure what the rate goes to.  However, she does seem to correlate feeling dizzy and nauseated during these episodes.  They last several minutes and typically go away with rest.  She has had occasional palpitations  here in the ER, and telemetry reveals occasional PVCs, an episode of trigeminy, and 1 couplet as well.  She currently feels alright, just tired/weak.  Potassium is down to 3.3.  Chest x-ray suggests mild atelectasis versus infiltrate at the right lung base, although she denies any cough, fever, or chills.  PAST MEDICAL HISTORY: 1. Cardiac catheterization February 2012 showing no obstructive CAD     with 40% proximal circumflex and 40% LAD.  February 2012 with     normal LV function with EF of 55-60% at that time and normal left     and right heart filling pressures.  No wall motion abnormalities. 2. Dyspnea.     a.     Evaluated catheterization as above.  BNP and D-dimer were      also reportedly normal at the time of her catheterization.     b.     Felt to have possibly some component of COPD, although Dr.      Delton Coombes suspect this has been largely related to deconditioning in      the past.  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 3. Depression. 4. GERD/hiatal hernia. 5. Chronic back pain status  post decompression and back surgery April     2011 with lumbar laminectomy October 2009. 6. Rheumatoid arthritis followed by Dr. Nickola Major. 7. History of PE in 1988 after bladder surgery, anticoagulated x6     months.  PAST SURGICAL HISTORY:  As above.  Also includes C-spine decompression, cystocele repair, hysterectomy, cholecystectomy, appendectomy, and bladder sling surgery.  MEDICATIONS: 1. Potassium chloride 20 mEq daily. 2. Lasix 40 mg daily. 3. Methotrexate weekly. 4. Aspirin 81 mg daily. 5. Folic acid 800 mcg daily. 6. Enbrel weekly. 7. Vitamin D3 2000 units, vitamin B12, vitamin C. 8. Citalopram 20 mg daily.  ALLERGIES:  CIPRO, LEVAQUIN, and MACROBID.  SOCIAL HISTORY:  [name] lives in [place].  She is widowed and lives by herself but has 2 daughters [name] and [name] that live nearby.  [name] accompanies her today.  She quit  smoking 3 years ago. Denies any alcohol or drug use.  FAMILY HISTORY:  Positive for CHF in her mother who also had an MI at 9.  Father died at 14 and had no known coronary disease.  Older sister has thyroid and kidney problems.  REVIEW OF SYSTEMS:  Positive for sweats as above.  No syncope, although she has had issues with feeling faint and nauseated, sometimes related to palpitations.  She has chronic right leg numbness related to prior back surgery.  No melena, hematemesis, or bright red blood per rectum. All other systems reviewed and otherwise negative.  LABORATORY DATA:  WBC 8, hemoglobin 12.4, hematocrit 35.4, platelet count 254,000.  Sodium 139, potassium 3.8 chloride 102, CO2 27, glucose 101, BUN 16, creatinine 0.84.  LFTs are normal.  Cardiac enzymes negative x1.  EKG:  Normal sinus rhythm, T-wave inversion V2 to V3 with flattening of V4-V5 and aVL.  RADIOLOGIC STUDIES:  Bronchitis and emphysematous changes and mild atelectasis versus consolidation at the right lung base.  PHYSICAL EXAMINATION:  VITAL SIGNS:  Temperature 98.9, pulse 82, respirations 16, blood pressure 132/42, pulse ox 99% on room air. GENERAL:  This is an obese white female in no acute distress. HEENT:  Normocephalic, atraumatic.  Extraocular movements intact.  Clear sclerae.  Nares are without discharge. NECK:  Supple without carotid bruit and JVD is at 8-9 cm. HEART:  Auscultation of the heart reveals regular rate and rhythm with S1, S2 without murmurs, rubs, gallops. LUNGS:  Dry crackles at the bases. ABDOMEN:  Soft, nontender, nondistended.  Positive bowel sounds. EXTREMITIES:  Warm and dry and with trace lower extremity edema.  She has 2+ pedal pulses bilaterally. NEUROLOGIC:  She is alert and oriented x3 and responds to questions appropriately with a normal affect.  ASSESSMENT/PLAN:  [name] is a 70 year old lady who was seen and examined by Dr. Shirlee Latch and myself.  She is a 70 year old lady  with a history of chronic exertional dyspnea and history of atypical chest pain with nonobstructive coronary artery disease by catheterization, normal heart pressures, normal left ventricular function, possible chronic obstructive pulmonary disease with mixed disease and PFTs in the past. 1. Congestive heart failure.  She has very mild volume overload on     exam with mild elevated JVP.  We will check a BNP and initiate     Lasix 40 mg IV x1 now and start 40 mg p.o. tomorrow for several     days.  This may be a component of diastolic dysfunction. 2. Chest pain atypical.  This is unlikely to be ischemic pain.  We     will rule out  myocardial infarction with cardiac enzymes.  Continue     aspirin.  We will check a D-dimer to rule out venous     thromboembolism. 3. Palpitations.  This seemed to be main complaint.  We will follow on     telemetry and only premature ventricular contractions have been     noted so far.  There was some abnormality noted on telemetry but     this appears to be related to her pulse ox.  We will replete her     potassium.  She may require an event monitor as an outpatient.     Ronie Spies, P.A.C.   ______________________________ Marca Ancona, MD    DD/MEDQ  D:  06/01/2011  T:  06/02/2011  Job:  [id number]  cc:   Luis Abed, MD, [facility] Dr. Cliffton Asters  Electronically Signed by Marca Ancona MD on 06/06/2011 08:29:36 PM Electronically Signed by Ronie Spies  on 06/12/2011 01:59:35 PM

## 2011-06-11 ENCOUNTER — Encounter: Payer: Self-pay | Admitting: Cardiology

## 2011-06-11 NOTE — Discharge Summary (Signed)
NAMEBENTLEIGH, WAREN                ACCOUNT NO.:  [id number]  MEDICAL RECORD NO.:  [id number]  LOCATION:  3735                         FACILITY:  [facility]  PHYSICIAN:  Luis Abed, MD, FACCDATE OF BIRTH:  [birth year]  DATE OF ADMISSION:  06/01/2011 DATE OF DISCHARGE:  06/04/2011                              DISCHARGE SUMMARY   PRIMARY CARDIOLOGIST:  Luis Abed, MD, [facility]  PRIMARY CARE PROVIDER:  Dr. Cliffton Asters.  DISCHARGE DIAGNOSES: 1. Chest pain without objective evidence of ischemia. 2. Mild acute diastolic congestive heart failure. 3. Depression. 4. Gastroesophageal reflux disease/hiatal hernia. 5. Chronic back pain, status post decompression back surgery in April     2011 with lumbar laminectomy in October 2009. 6. Rheumatoid arthritis followed by Dr. Nickola Major. 7. History of pulmonary embolus in 1988 after bypass surgery.     Anticoagulated for 6 months.  ALLERGIES:  CIPRO, LEVAQUIN, and MACROBID.  PROCEDURES:  Chest x-ray, June 03, 2011, showing stable exam with chronic basilar and subpleural opacities, compared with CT of May 2012.  HISTORY OF PRESENT ILLNESS:  A 70 year old female with prior history of chest pain and dyspnea along with prior catheterization in February 2012, revealing nonobstructive CAD and normal LV function as well as normal right and left heart filling pressures.  The patient was in her usual state of health until the morning of admission, when she woke at 3:00 am with substernal chest discomfort and pressure, gradually relieved after 2 sublingual nitroglycerin tablets.  She later, however, developed nausea and weakness and subsequently dyspnea prompting her present to the [facility] ED.  She was felt to have mild volume overload on exam, but otherwise, no objective evidence of ischemia.  She was admitted for further evaluation.  HOSPITAL COURSE:  The patient was gently diuresed with clinical improvement in dyspnea.  She does have chronic bibasilar  crackles.  Her chest x-ray is relatively unchanged to prior radiologic examinations. Blood pressure and heart rate has been stable once hospitalized, and we have made no changes to her medications.  In the absence of objective evidence of ischemia with prior nonobstructive catheterization, we plan to discharge her home today without further ischemic evaluation.  DISCHARGE LABS:  Hemoglobin 12.0, hematocrit 34.9, WBC 5.5, platelets 245.  INR 1.03. Sodium 140, potassium 4.3, chloride 104, CO2 of 28, BUN 17, creatinine 0.5, glucose 102, total bilirubin 0.5, alkaline phosphatase 81, AST 12, ALT 9, total protein 6.3, albumin 3.6, calcium 9.0, CK 59, MB 3.5, troponin less than 0.30, BNP 129.3, TSH 0.963. Urinalysis was negative.  DISPOSITION:  The patient will be discharged home today in good condition.  FOLLOW UP PLANS AND APPOINTMENTS:  Patient will follow up Dr. Willa Rough on November 1 at 3:15 p.m.  She will follow up with her primary care provider as previously scheduled.  DISCHARGE MEDICATIONS: 1. Aspirin 81 mg daily. 2. Citalopram 40 mg daily. 3. Enbrel 50 mg subcu q. week. 4. Folic acid 0.4 mg 2 tabs daily. 5. Furosemide 40 mg daily. 6. Methotrexate 20 mg subcu q. week. 7. Nitroglycerin 0.4 mg as needed for chest pain. 8. Potassium chloride 20 mEq daily. 9. Vitamin C 1 tab daily. 10.Voltaren 1% applied to  hands and knees b.i.d. p.r.n. 11.Vitamin B12 1 tab daily. 12.Vitamin D3 1000 units daily.  OUTSTANDING LAB STUDIES:  None.  DURATION OF DISCHARGE ENCOUNTER:  35 minutes including physician time.     Nicolasa Ducking, ANP   ______________________________ Luis Abed, MD, [facility]    CB/MEDQ  D:  06/04/2011  T:  06/04/2011  Job:  [id number]  cc:   Dr. Cliffton Asters  Electronically Signed by Nicolasa Ducking ANP on 06/05/2011 04:18:52 PM Electronically Signed by Willa Rough MD FACC on 06/11/2011 01:23:31 PM

## 2011-06-14 ENCOUNTER — Ambulatory Visit (INDEPENDENT_AMBULATORY_CARE_PROVIDER_SITE_OTHER): Payer: Medicare Other | Admitting: Cardiology

## 2011-06-14 ENCOUNTER — Encounter: Payer: Self-pay | Admitting: Cardiology

## 2011-06-14 DIAGNOSIS — I2510 Atherosclerotic heart disease of native coronary artery without angina pectoris: Secondary | ICD-10-CM

## 2011-06-14 DIAGNOSIS — N390 Urinary tract infection, site not specified: Secondary | ICD-10-CM | POA: Insufficient documentation

## 2011-06-14 DIAGNOSIS — I779 Disorder of arteries and arterioles, unspecified: Secondary | ICD-10-CM

## 2011-06-14 DIAGNOSIS — R0602 Shortness of breath: Secondary | ICD-10-CM

## 2011-06-14 NOTE — Assessment & Plan Note (Signed)
History the patient may have a urinary tract infection.  She knows it's very important to assess.  She will look into this this afternoon.

## 2011-06-14 NOTE — Progress Notes (Signed)
HPI Patient is seen today for followup of some shortness of breath.  She is also seen for followup recent brief hospitalization on June 01, 2011.  The patient has known coronary disease.  Her last catheter was in February 2 012 with nonobstructive disease.  She has good LV function.  She was admitted on June 01, 2011.  She appeared to be slightly volume overloaded.  There was no evidence of an MI area she diuresed lightly and went home on a slightly higher dose of diuretics.  Since then she is feeling relatively well  As part of today's evaluation I have reviewed the hospital records concerning her hospitalization.  The patient is also concerned that she might have urinary tract infection. Allergies  Allergen Reactions  . Ciprofloxacin   . Levofloxacin   . Nitrofurantoin     Current Outpatient Prescriptions  Medication Sig Dispense Refill  . Ascorbic Acid (VITAMIN C) 500 MG tablet Take 1,000 mg by mouth daily.       Marland Kitchen aspirin 81 MG tablet Take 81 mg by mouth daily.        . Cholecalciferol (VITAMIN D) 1000 UNITS capsule Take 1,000 Units by mouth 2 (two) times daily.        . citalopram (CELEXA) 40 MG tablet Take 20 mg by mouth daily.       . cyanocobalamin 2000 MCG tablet Take 2,000 mcg by mouth daily.        . diclofenac sodium (VOLTAREN) 1 % GEL Apply 1 application topically 2 (two) times daily.        . folic acid (FOLVITE) 800 MCG tablet Take 400 mcg by mouth daily.        . furosemide (LASIX) 20 MG tablet Take 40 mg by mouth daily.        . Melatonin 3 MG CAPS Take 1 capsule by mouth at bedtime.        . methotrexate 50 MG/2ML SOLN Inject 0.08 mg into the vein once a week.        . nitroGLYCERIN (NITROSTAT) 0.4 MG SL tablet Place 0.4 mg under the tongue every 5 (five) minutes as needed.        . pantoprazole (PROTONIX) 40 MG tablet Take 40 mg by mouth daily.        . potassium chloride (KLOR-CON) 10 MEQ CR tablet Take 10 mEq by mouth daily.       . ranitidine (ZANTAC) 150 MG  capsule Take 150 mg by mouth every evening.          History   Social History  . Marital Status: Widowed    Spouse Name: N/A    Number of Children: 2  . Years of Education: N/A   Occupational History  . retired    Social History Main Topics  . Smoking status: Former Smoker -- 1.0 packs/day for 20 years    Quit date: 08/14/2007  . Smokeless tobacco: Never Used  . Alcohol Use: No  . Drug Use: No  . Sexually Active: Not on file   Other Topics Concern  . 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 -stopped about 3 years Illicit Drug Use - noPatient does not get regular exercise.     Family History  Problem Relation Age of Onset  . Arthritis      family history  . Colon cancer Paternal Grandmother   . Lung cancer Father  x 3 uncles and a aunt  . Brain cancer Father   . Heart disease Mother   . Kidney disease Mother     chronic UTI  . Diabetes Mother   . Liver cancer Father   . Diabetes Sister   . Ovarian cancer Maternal Aunt   . Heart disease Maternal Uncle   . Kidney cancer Maternal Uncle   . Heart disease Paternal Aunt   . Pancreatic cancer Maternal Grandmother   . Heart attack      maternal family    Past Medical History  Diagnosis Date  . Pulmonary embolism   . Rheumatoid arthritis   . Hypertension   . Incontinence of feces   . Unspecified disorder of bladder   . Rectal prolapse   . Hiatal hernia   . IBS (irritable bowel syndrome)   . GERD (gastroesophageal reflux disease)   . Pain in limb   . Lumbago     hospital admission with back pain in April, 2011 repair of a disc herniation L4-L5   . Osteoarthrosis, unspecified whether generalized or localized, unspecified site   . COPD (chronic obstructive pulmonary disease)   . Tobacco use disorder   . CAD (coronary artery disease)     Nuclear, May, 2010, no ischemia / catheterization 2012, nonobstructive CAD ( 40%  circumflex, 40% LAD) EF normal  . Carotid artery disease     Doppler, July, 2010, 40-59% bilateral, stable, followup one year  . Adenomatous polyp 02/2008  . AVM (arteriovenous malformation)     Small colonic  . Ejection fraction     EF 55-60%, echo, April, 2011, grade 1 diastolic dysfunction.  . Shortness of breath     Cardiac evaluation, Dr.Kristian Hazzard, pulmonary evaluation, 2012, Dr. Delton Coombes.    Past Surgical History  Procedure Date  . Laminectomy     lumbar, HX of L4-5   . Cervical disc compression     c5-c6 Hx of  . Appendectomy   . Cholecystectomy     Interstim Medtronic stimulator 04/2010  . Bladder surgery     x 2  . Vaginal hysterectomy   . Rectocele repair     ROS  Patient denies fever, chills, headache, sweats, rash, change in vision, change in hearing, chest pain, cough, nausea vomiting, urinary symptoms.  All other systems are reviewed and are negative. PHYSICAL EXAM Patient is stable today.  Head is atraumatic.  There is no jugular venous distention.  Lungs are clear.  Respiratory effort is nonlabored.  Cardiac exam reveals S1 and S2.  No clicks or significant murmurs.  Abdomen is soft.  She has no peripheral edema today.  There are no musculoskeletal deformities.  There are no skin rashes. Filed Vitals:   06/14/11 1601  BP: 126/64  Pulse: 86  Height: 5\' 4"  (1.626 m)  Weight: 171 lb (77.565 kg)  SpO2: 99%   EKG is not done today.  ASSESSMENT & PLAN

## 2011-06-14 NOTE — Patient Instructions (Signed)
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

## 2011-06-14 NOTE — Assessment & Plan Note (Signed)
Patient was admitted with some increased shortness of breath.  She diuresed slightly.  Her diuretic dose was increased.  Chemistry lab will be checked today.  She'll be kept on 40 mg of Lasix.  I will see her for followup.

## 2011-06-14 NOTE — Assessment & Plan Note (Signed)
Since her last office visit she had a carotid Doppler in May, 2012.  This revealed 40-59% bilateral disease and will require follow up.

## 2011-06-14 NOTE — Assessment & Plan Note (Signed)
Coronary disease is stable.  She had no evidence of ischemia recently in the hospital.  No further workup.

## 2011-06-15 LAB — BASIC METABOLIC PANEL
BUN: 13 mg/dL (ref 6–23)
Chloride: 104 mEq/L (ref 96–112)
Glucose, Bld: 99 mg/dL (ref 70–99)
Potassium: 4.1 mEq/L (ref 3.5–5.1)

## 2011-06-25 ENCOUNTER — Other Ambulatory Visit: Payer: Medicare Other

## 2011-06-26 ENCOUNTER — Ambulatory Visit (INDEPENDENT_AMBULATORY_CARE_PROVIDER_SITE_OTHER)
Admission: RE | Admit: 2011-06-26 | Discharge: 2011-06-26 | Disposition: A | Discharge status: Home / Self Care | Payer: Medicare Other | Source: Ambulatory Visit | Attending: Emergency Medicine | Admitting: Emergency Medicine

## 2011-06-26 DIAGNOSIS — R0602 Shortness of breath: Secondary | ICD-10-CM

## 2011-07-02 ENCOUNTER — Ambulatory Visit (INDEPENDENT_AMBULATORY_CARE_PROVIDER_SITE_OTHER): Payer: Medicare Other | Admitting: Emergency Medicine

## 2011-07-02 ENCOUNTER — Encounter: Payer: Self-pay | Admitting: Emergency Medicine

## 2011-07-02 DIAGNOSIS — J84115 Respiratory bronchiolitis interstitial lung disease: Secondary | ICD-10-CM

## 2011-07-02 DIAGNOSIS — J849 Interstitial pulmonary disease, unspecified: Secondary | ICD-10-CM | POA: Insufficient documentation

## 2011-07-02 NOTE — Patient Instructions (Signed)
Dr Delton Coombes will review your CT scan with Dr Nickola Major.  You may need to stop methotrexate Follow with Dr Delton Coombes in 3 months

## 2011-07-02 NOTE — Progress Notes (Signed)
Subjective:    Patient ID: [name], female    DOB: Jun 28, 1941, 70 y.o.   MRN: [id number] HPI 70 yo former smoker, hx carotid disease, RA diagnosed by Dr Nickola Major in June '11, started on MTX in 7/11.  No evidence CAD by myoview in 5/10. Also hx PE in 1988 after a bladder surgery, anti-coag x 8mo.  Referred by Dr Cliffton Asters to evaluate SOB. She tells me that her breathing had some minor limitations up to 2 yrs ago. This improved some when she was treated for Vit D and B12 deficiency. Then became much more bothersome Summer 2010 when she started Prednisone, followed by MTX for her RA. She has gained about 30 lbs since the Summer. She believes that the SOB relates to the initiation of these meds. She was started on Spiriva 12/11 ago - no difference in her breathing.   ROV 08/23/10 -- Hx tobacco, follows up for evaluation of SOB. We performed CBC that showed no anemia, CXR without evidence for ILD. Tells me that MTX was stopped last Friday, still on pred. Having some nasal congestion today. Her breathing is still difficult w exertion. We stopped Spiriva last time, she doesn't miss it. Not on any other BDs.   ROV 01/03/11 -- hx tobacco, RA on MTX. CAD being followed by Dr Myrtis Ser. L and R cath in 2/12 showed CAD, normal LV and RV pressures. Has restriction vs mixed disease - did not improve with Spiriva and albuterol. Has been off prednisone since January. She had a CT scan chest that showed very subtle subpleural interstitial changes.   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 Also mixed dz on PFT but no clinical response to Spiriva + SABA. Returns for f/u CT scan chest done 06/28/11. She tells me that she has been more SOB since last visit, was admitted to hosp 10/12 for dyspnea and chest pain, was felt to be volume overloaded and responded to diuretics - improved resp status. Her LV fxn has ben normal by TTE, has diastolic dysfxn and A Fib as potential causes (?  Related to Embrel also - started 3-4 months ago). She remains on MTX. Her CT scan shows progression of sub-pleural fibrosis. Her MTX was increased last week - she isn't certain that the MTX has every helped her.    Review of Systems  as per HPI    Objective:   Physical Exam  Gen: Pleasant, obese, in no distress,  normal affect  ENT: No lesions,  mouth clear,  oropharynx clear, no postnasal drip  Neck: No JVD, no TMG, no carotid bruits  Lungs: R basilar insp crackles, L clear  Cardiovascular: RRR, heart sounds normal, no murmur or gallops, 1+ LE edema  Abdomen: obese, tender R mid epigastrium  Musculoskeletal: No deformities, no cyanosis or clubbing  Neuro: alert, non focal  Skin: Warm, no lesions or rashes   CT CHEST WITHOUT CONTRAST  06/28/11:  Technique: Multidetector CT imaging of the chest was performed  following the standard protocol without IV contrast.  Comparison: CT chest without contrast with high resolution images  dated 12/22/2010  Findings: The thoracic aorta is normal in caliber and contains  heavy atherosclerotic calcification. Coronary artery calcification  is visible. Heart is mildly enlarged and stable. Negative for  supraclavicular, mediastinal, hilar, or axillary lymphadenopathy.  Negative for pleural effusion or pleural calcification. Esophagus  is unremarkable.  Lung windows demonstrate subpleural reticulation and mild traction  bronchiectasis in  both lower lobes. Subpleural reticulation  appears more contiguous on today's examination, involving nearly  the entire periphery of both lower lobes. There is a stable and  lesser degree of subpleural reticulation in the inferior lingula.  Slight subpleural reticulation is seen in the lateral right middle  lobe at the lung base. Slight traction bronchiectasis is also seen  in the right middle lobe and lingula in the regions of subpleural  reticulation. No definite honeycombing.  Pleuroparenchymal  thickening at both lung apices, right greater  than left is stable. No airspace disease or pulmonary mass. The  trachea and mainstem bronchi are patent.  Cholecystectomy clips are noted. Imaged portions of both adrenal  glands, the imaged portion of the liver, spleen and pancreas are  within normal limits of noncontrast imaging.  Imaged vertebral bodies are normal in height alignment. No acute  or suspicious bony abnormality.  IMPRESSION:  1. Slight progression of lower lobe predominant (but also  including the inferior right middle lobe and lingula) interstitial  lung disease. Subpleural reticulation and traction bronchiectasis  are present. Nonspecific interstitial pneumonitis (NSIP), usual  interstitial pneumonitis (UIP), asbestos, and scleroderma are the  primary considerations.  2. Atherosclerotic calcification of the coronary arteries and  thoracic aorta.    Assessment & Plan:  Interstitial lung disease With slight progression in subpleural fibrosis on most recent Ct scan. Etiology will be difficult to determine - could be RA, could be due to MTX, but in absence of nodular disease I am worried that this is the MTX. I will speak to Dr Nickola Major, review the risks and benefits of staying on this med - she probably needs to stop, follow serial CT';s to see if she progresses of the med.

## 2011-07-02 NOTE — Assessment & Plan Note (Signed)
With slight progression in subpleural fibrosis on most recent Ct scan. Etiology will be difficult to determine - could be RA, could be due to MTX, but in absence of nodular disease I am worried that this is the MTX. I will speak to Dr Nickola Major, review the risks and benefits of staying on this med - she probably needs to stop, follow serial CT';s to see if she progresses of the med.

## 2011-08-13 ENCOUNTER — Other Ambulatory Visit: Payer: Self-pay

## 2011-08-13 ENCOUNTER — Telehealth: Payer: Self-pay | Admitting: Cardiology

## 2011-08-13 MED ORDER — FUROSEMIDE 40 MG PO TABS: 40.0000 mg | ORAL_TABLET | Freq: Every day | ORAL | Status: DC

## 2011-08-13 NOTE — Telephone Encounter (Signed)
New Problem:    Patient called because she needed to get her prescription changed for her furosemide (LASIX) 20 MG tablet to 40mg  with her pharmacy like Dr. Myrtis Ser had prescribed on her last visit.

## 2011-08-23 ENCOUNTER — Ambulatory Visit: Payer: Medicare Other | Admitting: Cardiology

## 2011-09-10 ENCOUNTER — Ambulatory Visit: Payer: Medicare Other | Admitting: Gastroenterology

## 2011-09-13 ENCOUNTER — Ambulatory Visit (INDEPENDENT_AMBULATORY_CARE_PROVIDER_SITE_OTHER): Payer: Medicare Other | Admitting: Cardiology

## 2011-09-13 ENCOUNTER — Encounter: Payer: Self-pay | Admitting: Cardiology

## 2011-09-13 DIAGNOSIS — R0602 Shortness of breath: Secondary | ICD-10-CM

## 2011-09-13 DIAGNOSIS — I10 Essential (primary) hypertension: Secondary | ICD-10-CM

## 2011-09-13 NOTE — Patient Instructions (Signed)
Your physician recommends that you schedule a follow-up appointment in: 6 months with Dr. Katz  

## 2011-09-13 NOTE — Assessment & Plan Note (Signed)
Shortness of breath is stable. No change in therapy.

## 2011-09-13 NOTE — Assessment & Plan Note (Signed)
Blood pressure stable today. No change in therapy. 

## 2011-09-13 NOTE — Progress Notes (Signed)
HPI Patient is seen for followup coronary disease and shortness of breath. She stable. I saw her last in November, 2012. At that time she had just been hospitalized. Cardiac catheterization showed nonobstructive disease. There was good LV function. There was mild volume overload. Diuretic dose was adjusted.  She's feeling well now. She does have some overall fatigue but this does not sound cardiac.   Allergies  Allergen Reactions  . Ciprofloxacin   . Levofloxacin   . Nitrofurantoin     Current Outpatient Prescriptions  Medication Sig Dispense Refill  . Ascorbic Acid (VITAMIN C) 500 MG tablet Take 1,000 mg by mouth daily.       Marland Kitchen aspirin 81 MG tablet Take 81 mg by mouth daily.        . Cholecalciferol (VITAMIN D) 1000 UNITS capsule Take 1,000 Units by mouth 2 (two) times daily.        . citalopram (CELEXA) 40 MG tablet Take 20 mg by mouth daily.       . cyanocobalamin 2000 MCG tablet Take 2,000 mcg by mouth daily.        . diclofenac sodium (VOLTAREN) 1 % GEL Apply 1 application topically 2 (two) times daily.        . Etanercept (ENBREL [place]) Inject 1 mg into the skin. Once a week       . folic acid (FOLVITE) 800 MCG tablet Take 400 mcg by mouth daily.        . furosemide (LASIX) 40 MG tablet Take 1 tablet (40 mg total) by mouth daily.  90 tablet  1  . Melatonin 3 MG CAPS Take 1 capsule by mouth at bedtime.        . methotrexate 50 MG/2ML SOLN Inject 1 mg into the vein once a week.       . nitroGLYCERIN (NITROSTAT) 0.4 MG SL tablet Place 0.4 mg under the tongue every 5 (five) minutes as needed.        . pantoprazole (PROTONIX) 40 MG tablet Take 40 mg by mouth daily.        . potassium chloride (KLOR-CON) 10 MEQ CR tablet Take 10 mEq by mouth daily.       . ranitidine (ZANTAC) 150 MG capsule Take 300 mg by mouth every evening.         History   Social History  . Marital Status: Widowed    Spouse Name: N/A    Number of Children: 2  . Years of Education: N/A   Occupational History    . retired    Social History Main Topics  . Smoking status: Former Smoker -- 1.0 packs/day for 20 years    Quit date: 08/14/2007  . Smokeless tobacco: Never Used  . Alcohol Use: No  . Drug Use: No  . Sexually Active: Not on file   Other Topics Concern  . 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 -stopped about 3 years Illicit Drug Use - noPatient does not get regular exercise.     Family History  Problem Relation Age of Onset  . Arthritis      family history  . Colon cancer Paternal Grandmother   . Lung cancer Father     x 3 uncles and a aunt  . Brain cancer Father   . Heart disease Mother   . Kidney disease Mother     chronic UTI  . Diabetes Mother   .  Liver cancer Father   . Diabetes Sister   . Ovarian cancer Maternal Aunt   . Heart disease Maternal Uncle   . Kidney cancer Maternal Uncle   . Heart disease Paternal Aunt   . Pancreatic cancer Maternal Grandmother   . Heart attack      maternal family    Past Medical History  Diagnosis Date  . Pulmonary embolism   . Rheumatoid arthritis   . Hypertension   . Incontinence of feces   . Unspecified disorder of bladder   . Rectal prolapse   . Hiatal hernia   . IBS (irritable bowel syndrome)   . GERD (gastroesophageal reflux disease)   . Pain in limb   . Lumbago     hospital admission with back pain in April, 2011 repair of a disc herniation L4-L5   . Osteoarthrosis, unspecified whether generalized or localized, unspecified site   . COPD (chronic obstructive pulmonary disease)   . Tobacco use disorder   . CAD (coronary artery disease)     Nuclear, May, 2010, no ischemia / catheterization 2012, nonobstructive CAD ( 40% circumflex, 40% LAD) EF normal  . Carotid artery disease     Doppler, July, 2010, 40-59% bilateral, stable, followup one year  . Adenomatous polyp 02/2008  . AVM (arteriovenous malformation)     Small colonic  .  Ejection fraction     EF 55-60%, echo, April, 2011, grade 1 diastolic dysfunction.  . Shortness of breath     Cardiac evaluation, Dr.Byrl Latin, pulmonary evaluation, 2012, Dr. Delton Coombes.  Marland Kitchen UTI (lower urinary tract infection)     Possible UTI by history June 14, 2011,     Past Surgical History  Procedure Date  . Laminectomy     lumbar, HX of L4-5   . Cervical disc compression     c5-c6 Hx of  . Appendectomy   . Cholecystectomy     Interstim Medtronic stimulator 04/2010  . Bladder surgery     x 2  . Vaginal hysterectomy   . Rectocele repair     ROS   Patient denies fever, chills, headache, sweats, rash, change in vision, change in hearing, chest pain, cough, nausea vomiting, urinary symptoms. All other S. Systems are reviewed and are negative.  PHYSICAL EXAM Patient is stable. She is overweight. There is no jugulovenous distention. Lungs are clear. Respiratory effort is nonlabored. Cardiac exam reveals S1 and S2. There no clicks or significant murmurs. The abdomen is soft. There is no peripheral edema.  Filed Vitals:   09/13/11 1114  BP: 126/70  Pulse: 80  Resp: 20  Height: 5\' 4"  (1.626 m)  Weight: 173 lb (78.472 kg)    ASSESSMENT & PLAN

## 2011-10-23 ENCOUNTER — Encounter: Payer: Self-pay | Admitting: Emergency Medicine

## 2011-10-23 ENCOUNTER — Ambulatory Visit (INDEPENDENT_AMBULATORY_CARE_PROVIDER_SITE_OTHER): Payer: Medicare Other | Admitting: Emergency Medicine

## 2011-10-23 DIAGNOSIS — R0602 Shortness of breath: Secondary | ICD-10-CM

## 2011-10-23 NOTE — Patient Instructions (Signed)
This point I believe it would be reasonable to try stopping methotrexate to see if you miss it. This will allow [place] to follow your breathing and your X-rays to see if your lung scarring stops progressing. Please discuss this with Dr Nickola Major on 3/13 Continue to follow with Dr Myrtis Ser regarding your fluid status We will reconsider using inhaled medications at your next visit Follow with Dr Delton Coombes in 6 weeks or sooner if you have any problems.

## 2011-10-23 NOTE — Assessment & Plan Note (Addendum)
Likely multifactorial, with risk for CHF (followed by Dr Myrtis Ser), but also contributions of ILD and probably mild COPD. She has never benefited from BD's.  She doesn' t believe that the MTX is doing much for her. At this point the risk-benefit seems to suggest that we should stop MTX and follow her clinically and radiographically.  - would stop mtx and follow CT scans or CXR's, clinical status - CHF regimen as per Dr Myrtis Ser - no BD's at this time but consider for the future.

## 2011-10-23 NOTE — Progress Notes (Signed)
Subjective:    Patient ID: [name], female    DOB: 01-02-1941, 71 y.o.   MRN: [id number] HPI 71 yo former smoker, hx carotid disease, RA diagnosed by Dr Nickola Major in June '11, started on MTX in 7/11.  No evidence CAD by myoview in 5/10. Also hx PE in 1988 after a bladder surgery, anti-coag x 7mo.  Referred by Dr Cliffton Asters to evaluate SOB. She tells me that her breathing had some minor limitations up to 2 yrs ago. This improved some when she was treated for Vit D and B12 deficiency. Then became much more bothersome Summer 2010 when she started Prednisone, followed by MTX for her RA. She has gained about 30 lbs since the Summer. She believes that the SOB relates to the initiation of these meds. She was started on Spiriva 12/11 ago - no difference in her breathing.   ROV 08/23/10 -- Hx tobacco, follows up for evaluation of SOB. We performed CBC that showed no anemia, CXR without evidence for ILD. Tells me that MTX was stopped last Friday, still on pred. Having some nasal congestion today. Her breathing is still difficult w exertion. We stopped Spiriva last time, she doesn't miss it. Not on any other BDs.   ROV 01/03/11 -- hx tobacco, RA on MTX. CAD being followed by Dr Myrtis Ser. L and R cath in 2/12 showed CAD, normal LV and RV pressures. Has restriction vs mixed disease - did not improve with Spiriva and albuterol. Has been off prednisone since January. She had a CT scan chest that showed very subtle subpleural interstitial changes.   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 Also mixed dz on PFT but no clinical response to Spiriva + SABA. Returns for f/u CT scan chest done 06/28/11. She tells me that she has been more SOB since last visit, was admitted to hosp 10/12 for dyspnea and chest pain, was felt to be volume overloaded and responded to diuretics - improved resp status. Her LV fxn has been normal by TTE, has diastolic dysfxn and A Fib as potential causes (?  Related to Embrel also - started 3-4 months ago). She remains on MTX. Her CT scan shows progression of sub-pleural fibrosis. Her MTX was increased last week - she isn't certain that the MTX has every helped her.   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 Also mixed dz on PFT but no clinical response to Spiriva + SABA. Her Embrel was stopped due to CHF exacerbation, but she is still on MTX. Her breathing hasn't really changed very much off the Embrel. She was temporarily off of MTX for three weeks, thought that her breathing was better. Her last CT scan 11/12 showed subpleural ILD, ? Related to MTX.   Review of Systems  as per HPI    Objective:   Physical Exam  Gen: Pleasant, obese, in no distress,  normal affect  ENT: No lesions,  mouth clear,  oropharynx clear, no postnasal drip  Neck: No JVD, no TMG, no carotid bruits  Lungs: R > L basilar insp crackles  Cardiovascular: RRR, heart sounds normal, no murmur or gallops, 1+ LE edema  Musculoskeletal: No deformities, no cyanosis or clubbing  Neuro: alert, non focal  Skin: Warm, no lesions or rashes   CT CHEST WITHOUT CONTRAST  06/28/11:  Technique: Multidetector CT imaging of the chest was performed  following the standard protocol without IV contrast.  Comparison: CT chest without contrast with high resolution images  dated 12/22/2010  Findings: The thoracic aorta is normal in caliber and contains  heavy atherosclerotic calcification. Coronary artery calcification  is visible. Heart is mildly enlarged and stable. Negative for  supraclavicular, mediastinal, hilar, or axillary lymphadenopathy.  Negative for pleural effusion or pleural calcification. Esophagus  is unremarkable.  Lung windows demonstrate subpleural reticulation and mild traction  bronchiectasis in both lower lobes. Subpleural reticulation  appears more contiguous on today's examination, involving nearly  the entire periphery  of both lower lobes. There is a stable and  lesser degree of subpleural reticulation in the inferior lingula.  Slight subpleural reticulation is seen in the lateral right middle  lobe at the lung base. Slight traction bronchiectasis is also seen  in the right middle lobe and lingula in the regions of subpleural  reticulation. No definite honeycombing.  Pleuroparenchymal thickening at both lung apices, right greater  than left is stable. No airspace disease or pulmonary mass. The  trachea and mainstem bronchi are patent.  Cholecystectomy clips are noted. Imaged portions of both adrenal  glands, the imaged portion of the liver, spleen and pancreas are  within normal limits of noncontrast imaging.  Imaged vertebral bodies are normal in height alignment. No acute  or suspicious bony abnormality.  IMPRESSION:  1. Slight progression of lower lobe predominant (but also  including the inferior right middle lobe and lingula) interstitial  lung disease. Subpleural reticulation and traction bronchiectasis  are present. Nonspecific interstitial pneumonitis (NSIP), usual  interstitial pneumonitis (UIP), asbestos, and scleroderma are the  primary considerations.  2. Atherosclerotic calcification of the coronary arteries and  thoracic aorta.   Assessment & Plan:  Shortness of breath Likely multifactorial, with risk for CHF (followed by Dr Myrtis Ser), but also contributions of ILD and probably mild COPD. She has never benefited from BD's.  She doesn' t believe that the MTX is doing much for her. At this point the risk-benefit seems to suggest that we should stop MTX and follow her clinically and radiographically.  - would stop mtx and follow CT scans or CXR's, clinical status - CHF regimen as per Dr Myrtis Ser - no BD's at this time but consider for the future.

## 2011-11-23 ENCOUNTER — Other Ambulatory Visit: Payer: Self-pay | Admitting: Cardiology

## 2011-12-04 ENCOUNTER — Encounter: Payer: Self-pay | Admitting: Emergency Medicine

## 2011-12-04 ENCOUNTER — Ambulatory Visit (INDEPENDENT_AMBULATORY_CARE_PROVIDER_SITE_OTHER): Payer: Medicare Other | Admitting: Emergency Medicine

## 2011-12-04 VITALS — BP 122/80 | HR 94 | Temp 98.6°F | Ht 64.0 in | Wt 172.8 lb

## 2011-12-04 DIAGNOSIS — R0602 Shortness of breath: Secondary | ICD-10-CM

## 2011-12-04 NOTE — Assessment & Plan Note (Signed)
Suspect multifactorial - her MTX was held in March, has stable ILD. Mixed disease on PFT but never responded to BD.  - follow clinical status, CXR's/CT's off MTX - repeat walking oximetry today - repeat TTE  - rov 2 mo

## 2011-12-04 NOTE — Progress Notes (Signed)
Subjective:    Patient ID: [name], female    DOB: 25-Oct-1940, 71 y.o.   MRN: [id number] HPI 71 yo former smoker, hx carotid disease, RA diagnosed by Dr Nickola Major in June '11, started on MTX in 7/11.  No evidence CAD by myoview in 5/10. Also hx PE in 1988 after a bladder surgery, anti-coag x 36mo.  Referred by Dr Cliffton Asters to evaluate SOB. She tells me that her breathing had some minor limitations up to 2 yrs ago. This improved some when she was treated for Vit D and B12 deficiency. Then became much more bothersome Summer 2010 when she started Prednisone, followed by MTX for her RA. She has gained about 30 lbs since the Summer. She believes that the SOB relates to the initiation of these meds. She was started on Spiriva 12/11 ago - no difference in her breathing.   ROV 08/23/10 -- Hx tobacco, follows up for evaluation of SOB. We performed CBC that showed no anemia, CXR without evidence for ILD. Tells me that MTX was stopped last Friday, still on pred. Having some nasal congestion today. Her breathing is still difficult w exertion. We stopped Spiriva last time, she doesn't miss it. Not on any other BDs.   ROV 01/03/11 -- hx tobacco, RA on MTX. CAD being followed by Dr Myrtis Ser. L and R cath in 2/12 showed CAD, normal LV and RV pressures. Has restriction vs mixed disease - did not improve with Spiriva and albuterol. Has been off prednisone since January. She had a CT scan chest that showed very subtle subpleural interstitial changes.   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 Also mixed dz on PFT but no clinical response to Spiriva + SABA. Returns for f/u CT scan chest done 06/28/11. She tells me that she has been more SOB since last visit, was admitted to hosp 10/12 for dyspnea and chest pain, was felt to be volume overloaded and responded to diuretics - improved resp status. Her LV fxn has been normal by TTE, has diastolic dysfxn and A Fib as potential causes (?  Related to Embrel also - started 3-4 months ago). She remains on MTX. Her CT scan shows progression of sub-pleural fibrosis. Her MTX was increased last week - she isn't certain that the MTX has every helped her.   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 Also mixed dz on PFT but no clinical response to Spiriva + SABA. Her Embrel was stopped due to CHF exacerbation, but she is still on MTX. Her breathing hasn't really changed very much off the Embrel. She was temporarily off of MTX for three weeks, thought that her breathing was better. Her last CT scan 11/12 showed subpleural ILD, ? Related to MTX.   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 Also mixed dz on PFT but no clinical response to Spiriva + SABA. Her MTX was stopped by Dr Nickola Major 10/2011. She continues to have low energy. Breathing is about the same, still able to walk 2 miles every day. Last TTE was 2011.   Review of Systems  as per HPI    Objective:   Physical Exam  Gen: Pleasant, obese, in no distress,  normal affect  ENT: No lesions,  mouth clear,  oropharynx clear, no postnasal drip  Neck: No JVD, no TMG, no carotid bruits  Lungs: R > L basilar  insp crackles  Cardiovascular: RRR, heart sounds normal, no murmur or gallops, 1+ LE edema  Musculoskeletal: No deformities, no cyanosis or clubbing  Neuro: alert, non focal  Skin: Warm, no lesions or rashes   CT CHEST WITHOUT CONTRAST  06/28/11:  Technique: Multidetector CT imaging of the chest was performed  following the standard protocol without IV contrast.  Comparison: CT chest without contrast with high resolution images  dated 12/22/2010  Findings: The thoracic aorta is normal in caliber and contains  heavy atherosclerotic calcification. Coronary artery calcification  is visible. Heart is mildly enlarged and stable. Negative for  supraclavicular, mediastinal, hilar, or axillary  lymphadenopathy.  Negative for pleural effusion or pleural calcification. Esophagus  is unremarkable.  Lung windows demonstrate subpleural reticulation and mild traction  bronchiectasis in both lower lobes. Subpleural reticulation  appears more contiguous on today's examination, involving nearly  the entire periphery of both lower lobes. There is a stable and  lesser degree of subpleural reticulation in the inferior lingula.  Slight subpleural reticulation is seen in the lateral right middle  lobe at the lung base. Slight traction bronchiectasis is also seen  in the right middle lobe and lingula in the regions of subpleural  reticulation. No definite honeycombing.  Pleuroparenchymal thickening at both lung apices, right greater  than left is stable. No airspace disease or pulmonary mass. The  trachea and mainstem bronchi are patent.  Cholecystectomy clips are noted. Imaged portions of both adrenal  glands, the imaged portion of the liver, spleen and pancreas are  within normal limits of noncontrast imaging.  Imaged vertebral bodies are normal in height alignment. No acute  or suspicious bony abnormality.  IMPRESSION:  1. Slight progression of lower lobe predominant (but also  including the inferior right middle lobe and lingula) interstitial  lung disease. Subpleural reticulation and traction bronchiectasis  are present. Nonspecific interstitial pneumonitis (NSIP), usual  interstitial pneumonitis (UIP), asbestos, and scleroderma are the  primary considerations.  2. Atherosclerotic calcification of the coronary arteries and  thoracic aorta.   Assessment & Plan:  Shortness of breath Suspect multifactorial - her MTX was held in March, has stable ILD. Mixed disease on PFT but never responded to BD.  - follow clinical status, CXR's/CT's off MTX - repeat walking oximetry today - repeat TTE  - rov 2 mo

## 2011-12-04 NOTE — Patient Instructions (Signed)
Walking oximetry today We will repeat your echocardiogram to compare with 2011 and then regular follow up with Dr Myrtis Ser.  Agree with stopping methotrexate for now to see if this helps your fatigue and breathing.  Follow with Dr Delton Coombes in 2 months

## 2011-12-05 ENCOUNTER — Other Ambulatory Visit: Payer: Self-pay

## 2011-12-05 ENCOUNTER — Ambulatory Visit (HOSPITAL_COMMUNITY): Payer: Medicare Other | Attending: Cardiovascular Disease

## 2011-12-05 DIAGNOSIS — I10 Essential (primary) hypertension: Secondary | ICD-10-CM | POA: Insufficient documentation

## 2011-12-05 DIAGNOSIS — R0609 Other forms of dyspnea: Secondary | ICD-10-CM | POA: Insufficient documentation

## 2011-12-05 DIAGNOSIS — R0602 Shortness of breath: Secondary | ICD-10-CM

## 2011-12-05 DIAGNOSIS — I519 Heart disease, unspecified: Secondary | ICD-10-CM | POA: Insufficient documentation

## 2011-12-05 DIAGNOSIS — I4949 Other premature depolarization: Secondary | ICD-10-CM | POA: Insufficient documentation

## 2011-12-05 DIAGNOSIS — Z87891 Personal history of nicotine dependence: Secondary | ICD-10-CM | POA: Insufficient documentation

## 2011-12-05 DIAGNOSIS — R0989 Other specified symptoms and signs involving the circulatory and respiratory systems: Secondary | ICD-10-CM | POA: Insufficient documentation

## 2011-12-05 DIAGNOSIS — I517 Cardiomegaly: Secondary | ICD-10-CM | POA: Insufficient documentation

## 2011-12-05 DIAGNOSIS — R079 Chest pain, unspecified: Secondary | ICD-10-CM | POA: Insufficient documentation

## 2011-12-05 DIAGNOSIS — I2510 Atherosclerotic heart disease of native coronary artery without angina pectoris: Secondary | ICD-10-CM | POA: Insufficient documentation

## 2011-12-05 DIAGNOSIS — J449 Chronic obstructive pulmonary disease, unspecified: Secondary | ICD-10-CM | POA: Insufficient documentation

## 2011-12-05 DIAGNOSIS — J4489 Other specified chronic obstructive pulmonary disease: Secondary | ICD-10-CM | POA: Insufficient documentation

## 2011-12-07 ENCOUNTER — Encounter: Payer: Self-pay | Admitting: Cardiology

## 2011-12-07 ENCOUNTER — Ambulatory Visit (INDEPENDENT_AMBULATORY_CARE_PROVIDER_SITE_OTHER): Payer: Medicare Other | Admitting: Cardiology

## 2011-12-07 VITALS — BP 118/78 | HR 86 | Ht 64.0 in | Wt 175.0 lb

## 2011-12-07 DIAGNOSIS — I779 Disorder of arteries and arterioles, unspecified: Secondary | ICD-10-CM

## 2011-12-07 DIAGNOSIS — I2510 Atherosclerotic heart disease of native coronary artery without angina pectoris: Secondary | ICD-10-CM

## 2011-12-07 DIAGNOSIS — R9430 Abnormal result of cardiovascular function study, unspecified: Secondary | ICD-10-CM

## 2011-12-07 DIAGNOSIS — R0602 Shortness of breath: Secondary | ICD-10-CM

## 2011-12-07 DIAGNOSIS — R0989 Other specified symptoms and signs involving the circulatory and respiratory systems: Secondary | ICD-10-CM

## 2011-12-07 NOTE — Patient Instructions (Signed)
Your physician wants you to follow-up in:  6 months. You will receive a reminder letter in the mail two months in advance. If you don't receive a letter, please call our office to schedule the follow-up appointment.   

## 2011-12-07 NOTE — Assessment & Plan Note (Signed)
Her carotid disease has been followed carefully and it is stable.

## 2011-12-07 NOTE — Assessment & Plan Note (Addendum)
Unfortunately I have nothing helpful to add concerning the further evaluation of her shortness of breath. She has only mild coronary artery disease. She had normal LV and RV pressures at the time of her cath in 2012. I feel that there is no indication for further cardiac testing.

## 2011-12-07 NOTE — Progress Notes (Signed)
HPI Patient is seen to followup shortness of breath. I reviewed the notes by Dr. Delton Coombes of the pulmonary team. He continues to Search for answers to explain her shortness of breath. She does have an abnormal physical exam as it relates to fine rales at the bases. All of our data has suggested that this is not due to volume overload. She does have a tendencies to have some fluid overload at times and she is on a diuretic. She's had a followup 2-D echo. She has normal systolic function. She probably has some diastolic dysfunction. There is no evidence of right heart dysfunction or pulmonary hypertension.  Allergies  Allergen Reactions  . Ciprofloxacin   . Levofloxacin   . Nitrofurantoin     Current Outpatient Prescriptions  Medication Sig Dispense Refill  . Ascorbic Acid (VITAMIN C) 500 MG tablet Take 1,000 mg by mouth daily.       Marland Kitchen aspirin 81 MG tablet Take 81 mg by mouth daily.        . Cholecalciferol (VITAMIN D) 1000 UNITS capsule Take 1,000 Units by mouth 2 (two) times daily.        . citalopram (CELEXA) 40 MG tablet Take 20 mg by mouth daily as needed.       . cyanocobalamin 2000 MCG tablet Take 2,000 mcg by mouth daily.        . diclofenac sodium (VOLTAREN) 1 % GEL Apply 1 application topically 2 (two) times daily.        . furosemide (LASIX) 40 MG tablet Take 1 tablet (40 mg total) by mouth daily.  90 tablet  1  . Melatonin 3 MG CAPS Take 1 capsule by mouth at bedtime.        . nitroGLYCERIN (NITROSTAT) 0.4 MG SL tablet Place 0.4 mg under the tongue every 5 (five) minutes as needed.        . pantoprazole (PROTONIX) 40 MG tablet Take 40 mg by mouth daily.        . potassium chloride (KLOR-CON) 10 MEQ CR tablet Take 10 mEq by mouth daily.       . ranitidine (ZANTAC) 150 MG capsule Take 300 mg by mouth every evening.       Marland Kitchen DISCONTD: Etanercept (ENBREL [place]) Inject 1 mg into the skin. Once a week         History   Social History  . Marital Status: Widowed    Spouse Name: N/A   Number of Children: 2  . Years of Education: N/A   Occupational History  . retired    Social History Main Topics  . Smoking status: Former Smoker -- 1.0 packs/day for 30 years    Quit date: 08/14/2007  . Smokeless tobacco: Never Used  . Alcohol Use: No  . Drug Use: No  . Sexually Active: Not on file   Other Topics Concern  . 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 -stopped about 3 years Illicit Drug Use - noPatient does not get regular exercise.     Family History  Problem Relation Age of Onset  . Arthritis      family history  . Colon cancer Paternal Grandmother   . Lung cancer Father     x 3 uncles and a aunt  . Brain cancer Father   . Heart disease Mother   . Kidney disease Mother     chronic UTI  .  Diabetes Mother   . Liver cancer Father   . Diabetes Sister   . Ovarian cancer Maternal Aunt   . Heart disease Maternal Uncle   . Kidney cancer Maternal Uncle   . Heart disease Paternal Aunt   . Pancreatic cancer Maternal Grandmother   . Heart attack      maternal family    Past Medical History  Diagnosis Date  . Pulmonary embolism   . Rheumatoid arthritis   . Hypertension   . Incontinence of feces   . Unspecified disorder of bladder   . Rectal prolapse   . Hiatal hernia   . IBS (irritable bowel syndrome)   . GERD (gastroesophageal reflux disease)   . Pain in limb   . Lumbago     hospital admission with back pain in April, 2011 repair of a disc herniation L4-L5   . Osteoarthrosis, unspecified whether generalized or localized, unspecified site   . COPD (chronic obstructive pulmonary disease)   . Tobacco use disorder   . CAD (coronary artery disease)     Nuclear, May, 2010, no ischemia / catheterization 2012, nonobstructive CAD ( 40% circumflex, 40% LAD) EF normal  . Carotid artery disease     Doppler, July, 2010, 40-59% bilateral, stable, followup one year  . 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 AVM (arteriovenous malformation)     Small colonic  . Ejection fraction     EF 55-60%, echo, April, 2011, grade 1 diastolic dysfunction.  . Shortness of breath     Cardiac evaluation, Dr.Navy Rothschild, pulmonary evaluation, 2012, Dr. Delton Coombes.  Marland Kitchen UTI (lower urinary tract infection)     Possible UTI by history June 14, 2011,     Past Surgical History  Procedure Date  . Laminectomy     lumbar, HX of L4-5   . Cervical disc compression     c5-c6 Hx of  . Appendectomy   . Cholecystectomy     Interstim Medtronic stimulator 04/2010  . Bladder surgery     x 2  . Vaginal hysterectomy   . Rectocele repair     ROS   Patient denies fever, chills, headache, sweats, rash, change in vision, change in hearing, chest pain, nausea vomiting, urinary symptoms. All other systems are reviewed and are negative.  PHYSICAL EXAM  Patient is oriented to person time and place. Affect is normal. There is no jugulovenous distention. Lung exam reveals fine rales at the bases bilaterally. There is no respiratory distress. Cardiac exam reveals S1 and S2. There no clicks or significant murmurs. The abdomen is soft. Is no peripheral edema.  Filed Vitals:   12/07/11 1420  BP: 118/78  Pulse: 86  Height: 5\' 4"  (1.626 m)  Weight: 175 lb (79.379 kg)   EKG is done today and reviewed by me. There is no significant change. There is sinus rhythm.  ASSESSMENT & PLAN

## 2011-12-07 NOTE — Assessment & Plan Note (Signed)
Coronary disease is stable. No change in therapy. 

## 2012-01-03 ENCOUNTER — Encounter: Payer: Self-pay | Admitting: Emergency Medicine

## 2012-01-03 ENCOUNTER — Ambulatory Visit (INDEPENDENT_AMBULATORY_CARE_PROVIDER_SITE_OTHER): Payer: Medicare Other | Admitting: Emergency Medicine

## 2012-01-03 ENCOUNTER — Ambulatory Visit (INDEPENDENT_AMBULATORY_CARE_PROVIDER_SITE_OTHER)
Admission: RE | Admit: 2012-01-03 | Discharge: 2012-01-03 | Disposition: A | Discharge status: Home / Self Care | Payer: Medicare Other | Source: Ambulatory Visit | Attending: Emergency Medicine | Admitting: Emergency Medicine

## 2012-01-03 VITALS — BP 118/80 | HR 106 | Temp 98.1°F | Ht 64.0 in | Wt 173.2 lb

## 2012-01-03 DIAGNOSIS — J8410 Pulmonary fibrosis, unspecified: Secondary | ICD-10-CM

## 2012-01-03 DIAGNOSIS — J449 Chronic obstructive pulmonary disease, unspecified: Secondary | ICD-10-CM

## 2012-01-03 DIAGNOSIS — R0602 Shortness of breath: Secondary | ICD-10-CM

## 2012-01-03 DIAGNOSIS — J849 Interstitial pulmonary disease, unspecified: Secondary | ICD-10-CM

## 2012-01-03 NOTE — Assessment & Plan Note (Signed)
No evidence on repeat TTE 12/05/11 for [facility]

## 2012-01-03 NOTE — Progress Notes (Signed)
Subjective:    Patient ID: [name], female    DOB: 1941/04/16, 71 y.o.   MRN: [id number] HPI 71 yo former smoker, hx carotid disease, RA diagnosed by Dr Nickola Major in June '11, started on MTX in 7/11.  No evidence CAD by myoview in 5/10. Also hx PE in 1988 after a bladder surgery, anti-coag x 51mo.  Referred by Dr Cliffton Asters to evaluate SOB. She tells me that her breathing had some minor limitations up to 2 yrs ago. This improved some when she was treated for Vit D and B12 deficiency. Then became much more bothersome Summer 2010 when she started Prednisone, followed by MTX for her RA. She has gained about 30 lbs since the Summer. She believes that the SOB relates to the initiation of these meds. She was started on Spiriva 12/11 ago - no difference in her breathing.   ROV 08/23/10 -- Hx tobacco, follows up for evaluation of SOB. We performed CBC that showed no anemia, CXR without evidence for ILD. Tells me that MTX was stopped last Friday, still on pred. Having some nasal congestion today. Her breathing is still difficult w exertion. We stopped Spiriva last time, she doesn't miss it. Not on any other BDs.   ROV 01/03/11 -- hx tobacco, RA on MTX. CAD being followed by Dr Myrtis Ser. L and R cath in 2/12 showed CAD, normal LV and RV pressures. Has restriction vs mixed disease - did not improve with Spiriva and albuterol. Has been off prednisone since January. She had a CT scan chest that showed very subtle subpleural interstitial changes.   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 Also mixed dz on PFT but no clinical response to Spiriva + SABA. Returns for f/u CT scan chest done 06/28/11. She tells me that she has been more SOB since last visit, was admitted to hosp 10/12 for dyspnea and chest pain, was felt to be volume overloaded and responded to diuretics - improved resp status. Her LV fxn has been normal by TTE, has diastolic dysfxn and A Fib as potential causes (?  Related to Embrel also - started 3-4 months ago). She remains on MTX. Her CT scan shows progression of sub-pleural fibrosis. Her MTX was increased last week - she isn't certain that the MTX has every helped her.   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 Also mixed dz on PFT but no clinical response to Spiriva + SABA. Her Embrel was stopped due to CHF exacerbation, but she is still on MTX. Her breathing hasn't really changed very much off the Embrel. She was temporarily off of MTX for three weeks, thought that her breathing was better. Her last CT scan 11/12 showed subpleural ILD, ? Related to MTX.   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 Also mixed dz on PFT but no clinical response to Spiriva + SABA. Her MTX was stopped by Dr Nickola Major 10/2011. She continues to have low energy. Breathing is about the same, still able to walk 2 miles every day. Last TTE was 2011.   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 Also mixed dz on PFT but no clinical response to Spiriva + SABA. Repeat TTE 4/24 showed estimated RVSP . She is still able to walk her two miles, but she has dyspnea with chores, sweeping,  etc. Her wt has gone up over the last year, probably 7-8 lbs.   Review of Systems  as per HPI    Objective:  Physical Exam Filed Vitals:   01/03/12 1452  BP: 118/80  Pulse: 106  Temp: 98.1 F (36.7 C)   Gen: Pleasant, obese, in no distress,  normal affect  ENT: No lesions,  mouth clear,  oropharynx clear, no postnasal drip  Neck: No JVD, no TMG, no carotid bruits  Lungs: R > L basilar insp crackles, clear superiorly  Cardiovascular: RRR, heart sounds normal, no murmur or gallops, 1+ LE edema  Musculoskeletal: No deformities, no cyanosis or clubbing  Neuro: alert, non focal  Skin: Warm, no lesions or rashes   CT CHEST WITHOUT CONTRAST  06/28/11:    Technique: Multidetector CT imaging of the chest was performed  following the standard protocol without IV contrast.  Comparison: CT chest without contrast with high resolution images  dated 12/22/2010  Findings: The thoracic aorta is normal in caliber and contains  heavy atherosclerotic calcification. Coronary artery calcification  is visible. Heart is mildly enlarged and stable. Negative for  supraclavicular, mediastinal, hilar, or axillary lymphadenopathy.  Negative for pleural effusion or pleural calcification. Esophagus  is unremarkable.  Lung windows demonstrate subpleural reticulation and mild traction  bronchiectasis in both lower lobes. Subpleural reticulation  appears more contiguous on today's examination, involving nearly  the entire periphery of both lower lobes. There is a stable and  lesser degree of subpleural reticulation in the inferior lingula.  Slight subpleural reticulation is seen in the lateral right middle  lobe at the lung base. Slight traction bronchiectasis is also seen  in the right middle lobe and lingula in the regions of subpleural  reticulation. No definite honeycombing.  Pleuroparenchymal thickening at both lung apices, right greater  than left is stable. No airspace disease or pulmonary mass. The  trachea and mainstem bronchi are patent.  Cholecystectomy clips are noted. Imaged portions of both adrenal  glands, the imaged portion of the liver, spleen and pancreas are  within normal limits of noncontrast imaging.  Imaged vertebral bodies are normal in height alignment. No acute  or suspicious bony abnormality.  IMPRESSION:  1. Slight progression of lower lobe predominant (but also  including the inferior right middle lobe and lingula) interstitial  lung disease. Subpleural reticulation and traction bronchiectasis  are present. Nonspecific interstitial pneumonitis (NSIP), usual  interstitial pneumonitis (UIP), asbestos, and scleroderma are the   primary considerations.  2. Atherosclerotic calcification of the coronary arteries and  thoracic aorta.   Assessment & Plan:  COPD (chronic obstructive pulmonary disease) AFL by PFT but has never benefited from BD;s - would consider retrying SABA again.  - consider pulmonary rehab   Interstitial lung disease - repeat CT scan of the chest to assess her ILD - agree with staying off MTX  Shortness of breath No evidence on repeat TTE 12/05/11 for [facility]

## 2012-01-03 NOTE — Patient Instructions (Signed)
We will repeat your CT scan of the chest to compare with 07/2011.  Follow with Dr Delton Coombes in 1 month

## 2012-01-03 NOTE — Assessment & Plan Note (Signed)
AFL by PFT but has never benefited from BD;s - would consider retrying SABA again.  - consider pulmonary rehab

## 2012-01-03 NOTE — Assessment & Plan Note (Signed)
-   repeat CT scan of the chest to assess her ILD - agree with staying off MTX

## 2012-01-04 ENCOUNTER — Ambulatory Visit: Payer: Medicare Other | Admitting: Adult Health

## 2012-01-09 NOTE — Progress Notes (Signed)
Quick Note:  Called, spoke with pt. I informed her of below results and recs per RB. She verbalized understanding of these results and recs. She does have a pending OV with RB on February 01, 2012 at 3pm -- I reminded pt of this appt and asked to pls keep this OV to discuss CT further but call back if anything further is needed prior to this. She verbalized understanding. Nothing further needed at this time. ______

## 2012-01-22 ENCOUNTER — Other Ambulatory Visit: Payer: Self-pay | Admitting: Cardiology

## 2012-01-27 ENCOUNTER — Other Ambulatory Visit: Payer: Self-pay | Admitting: Cardiology

## 2012-01-29 ENCOUNTER — Other Ambulatory Visit: Payer: Self-pay | Admitting: Cardiology

## 2012-01-30 ENCOUNTER — Other Ambulatory Visit: Payer: Self-pay | Admitting: Cardiology

## 2012-01-30 MED ORDER — NITROGLYCERIN 0.4 MG SL SUBL: 0.4000 mg | SUBLINGUAL_TABLET | SUBLINGUAL | Status: DC | PRN

## 2012-01-30 NOTE — Telephone Encounter (Signed)
Pt called and [facility]'t have rx please resend

## 2012-01-31 ENCOUNTER — Encounter: Payer: Self-pay | Admitting: Emergency Medicine

## 2012-01-31 ENCOUNTER — Ambulatory Visit (INDEPENDENT_AMBULATORY_CARE_PROVIDER_SITE_OTHER): Payer: Medicare Other | Admitting: Emergency Medicine

## 2012-01-31 VITALS — BP 140/78 | HR 88 | Temp 98.5°F | Ht 64.5 in | Wt 174.6 lb

## 2012-01-31 DIAGNOSIS — J449 Chronic obstructive pulmonary disease, unspecified: Secondary | ICD-10-CM

## 2012-01-31 DIAGNOSIS — J849 Interstitial pulmonary disease, unspecified: Secondary | ICD-10-CM

## 2012-01-31 DIAGNOSIS — J8410 Pulmonary fibrosis, unspecified: Secondary | ICD-10-CM

## 2012-01-31 MED ORDER — ALBUTEROL SULFATE HFA 108 (90 BASE) MCG/ACT IN AERS: 2.0000 | INHALATION_SPRAY | Freq: Four times a day (QID) | RESPIRATORY_TRACT | Status: DC | PRN

## 2012-01-31 NOTE — Progress Notes (Signed)
Subjective:    Patient ID: [name], female    DOB: 11-Jun-1941, 71 y.o.   MRN: [id number] HPI 71 yo former smoker, hx carotid disease, RA diagnosed by Dr Nickola Major in June '11, started on MTX in 7/11.  No evidence CAD by myoview in 5/10. Also hx PE in 1988 after a bladder surgery, anti-coag x 26mo.  Referred by Dr Cliffton Asters to evaluate SOB. She tells me that her breathing had some minor limitations up to 2 yrs ago. This improved some when she was treated for Vit D and B12 deficiency. Then became much more bothersome Summer 2010 when she started Prednisone, followed by MTX for her RA. She has gained about 30 lbs since the Summer. She believes that the SOB relates to the initiation of these meds. She was started on Spiriva 12/11 ago - no difference in her breathing.   ROV 08/23/10 -- Hx tobacco, follows up for evaluation of SOB. We performed CBC that showed no anemia, CXR without evidence for ILD. Tells me that MTX was stopped last Friday, still on pred. Having some nasal congestion today. Her breathing is still difficult w exertion. We stopped Spiriva last time, she doesn't miss it. Not on any other BDs.   ROV 01/03/11 -- hx tobacco, RA on MTX. CAD being followed by Dr Myrtis Ser. L and R cath in 2/12 showed CAD, normal LV and RV pressures. Has restriction vs mixed disease - did not improve with Spiriva and albuterol. Has been off prednisone since January. She had a CT scan chest that showed very subtle subpleural interstitial changes.   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 Also mixed dz on PFT but no clinical response to Spiriva + SABA. Returns for f/u CT scan chest done 06/28/11. She tells me that she has been more SOB since last visit, was admitted to hosp 10/12 for dyspnea and chest pain, was felt to be volume overloaded and responded to diuretics - improved resp status. Her LV fxn has been normal by TTE, has diastolic dysfxn and A Fib as potential causes (?  Related to Embrel also - started 3-4 months ago). She remains on MTX. Her CT scan shows progression of sub-pleural fibrosis. Her MTX was increased last week - she isn't certain that the MTX has every helped her.   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 Also mixed dz on PFT but no clinical response to Spiriva + SABA. Her Embrel was stopped due to CHF exacerbation, but she is still on MTX. Her breathing hasn't really changed very much off the Embrel. She was temporarily off of MTX for three weeks, thought that her breathing was better. Her last CT scan 11/12 showed subpleural ILD, ? Related to MTX.   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 Also mixed dz on PFT but no clinical response to Spiriva + SABA. Her MTX was stopped by Dr Nickola Major 10/2011. She continues to have low energy. Breathing is about the same, still able to walk 2 miles every day. Last TTE was 2011.   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 Also mixed dz on PFT but no clinical response to Spiriva + SABA. Repeat TTE 4/24 showed estimated RVSP . She is still able to walk her two miles, but she has dyspnea with chores, sweeping,  etc. Her wt has gone up over the last year, probably 7-8 lbs.   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 Also mixed dz on PFT but no clinical response to Spiriva + SABA.  Returns after repeat CT scan of the chest to eval her ILD and nodules >> stable. She tells me today that her breathing has been stable to improved.  She was started on Pred for the last 2 weeks for joint pain - also seems to have made her breathing better.   Review of Systems  as per HPI    Objective:  Physical Exam Filed Vitals:   01/31/12 1535  BP: 140/78  Pulse: 88  Temp: 98.5 F (36.9 C)   Gen: Pleasant, obese, in no distress,  normal  affect  ENT: No lesions,  mouth clear,  oropharynx clear, no postnasal drip  Neck: No JVD, no TMG, no carotid bruits  Lungs: R > L basilar insp crackles, clear superiorly  Cardiovascular: RRR, heart sounds normal, no murmur or gallops, 1+ LE edema  Musculoskeletal: No deformities, no cyanosis or clubbing  Neuro: alert, non focal  Skin: Warm, no lesions or rashes   CT chest 01/09/12 --  Comparison: Chest CT 06/26/2011.  Findings:  Mediastinum: Heart size is normal. There is no significant  pericardial fluid, thickening or pericardial calcification. There  is atherosclerosis of the thoracic aorta, the great vessels of the  mediastinum and the coronary arteries, including calcified  atherosclerotic plaque in the left main, left anterior descending  and left circumflex coronary arteries. No pathologically enlarged  mediastinal or hilar lymph nodes. Please note that accurate  exclusion of hilar adenopathy is limited on noncontrast CT scans.  Esophagus is unremarkable in appearance.  Lungs/Pleura: Again noted is an unusual pattern of peripheral  ground-glass attenuation in the immediate subpleural distribution  of the lung bases bilaterally, most pronounced in the lower lobes.  There is some associated subpleural reticulation, however, this is  relatively mild. No frank honeycombing is identified. There is  only a small amount of peripheral traction bronchiectasis and  bronchiolectasis in the affected regions of the lungs. A few  patchy ground glass attenuation foci are noted throughout other  portions of the lungs bilaterally. Inspiratory and expiratory high  resolution imaging confirmed the above findings, but were otherwise  unremarkable. No definite suspicious appearing pulmonary nodules  or masses are clearly identified on today's examinations. No  pleural effusions.  Upper Abdomen: Status post cholecystectomy. Splenomegaly (AP  diameter of the spleen is approximate 12.8 cm).   Musculoskeletal: There are no aggressive appearing lytic or blastic  lesions noted in the visualized portions of the skeleton.  IMPRESSION:  1. Findings, as above, again consistent with an underlying  interstitial lung disease. The pattern is unusual but nonspecific,  and are favored to represent a manifestation of nonspecific  interstitial pneumonia (NSIP). At this time, there is no frank  honeycombing to suggest usual interstitial pneumonia (UIP). These  findings are potentially related to the patient's history of  rheumatoid arthritis.  2. Atherosclerosis, including left main and two-vessel coronary  artery disease. Please note that although the presence of coronary  artery calcium documents the presence of coronary artery disease,  the severity of this disease and any potential stenosis cannot be  assessed on this non-gated CT examination. Assessment for  potential risk factor modification, dietary therapy or  pharmacologic therapy may be warranted, if clinically indicated.  3.  Splenomegaly.  4. Status post cholecystectomy.   Assessment & Plan:  Interstitial lung disease Stable by CT scan 12/2011. No changes at this time. Not enough GGI present to merit steroid treatment. Etiology MTX vs RA? - stay off MTX  COPD No clinical response to  BD's - will restart/retry albuterol prn

## 2012-01-31 NOTE — Assessment & Plan Note (Signed)
Stable by CT scan 12/2011. No changes at this time. Not enough GGI present to merit steroid treatment. Etiology MTX vs RA? - stay off MTX

## 2012-01-31 NOTE — Patient Instructions (Addendum)
Your CT scan of the chest is stable. We do not need to start any prednisone at this time Try using albuterol 2 puffs if needed for shortness of breath Follow with Dr Delton Coombes in 6 months or sooner if you have any problems

## 2012-01-31 NOTE — Assessment & Plan Note (Signed)
No clinical response to  BD's - will restart/retry albuterol prn

## 2012-02-01 ENCOUNTER — Ambulatory Visit: Payer: Medicare Other | Admitting: Emergency Medicine

## 2012-02-06 ENCOUNTER — Other Ambulatory Visit: Payer: Self-pay | Admitting: Cardiology

## 2012-02-06 DIAGNOSIS — I6529 Occlusion and stenosis of unspecified carotid artery: Secondary | ICD-10-CM

## 2012-02-07 ENCOUNTER — Encounter (INDEPENDENT_AMBULATORY_CARE_PROVIDER_SITE_OTHER): Payer: Medicare Other

## 2012-02-07 DIAGNOSIS — I6529 Occlusion and stenosis of unspecified carotid artery: Secondary | ICD-10-CM

## 2012-02-12 ENCOUNTER — Encounter: Payer: Self-pay | Admitting: Cardiology

## 2012-02-16 IMAGING — CR DG CHEST 2V
2 series · 2 of 2 positions shown · non-contrast
Comparison: 12/30/2010

CLINICAL DATA: Chest pain, tightness, shortness of breath,
weakness, headache, sweats, nausea, former smoker

CHEST - 2 VIEW

[w chest pa]
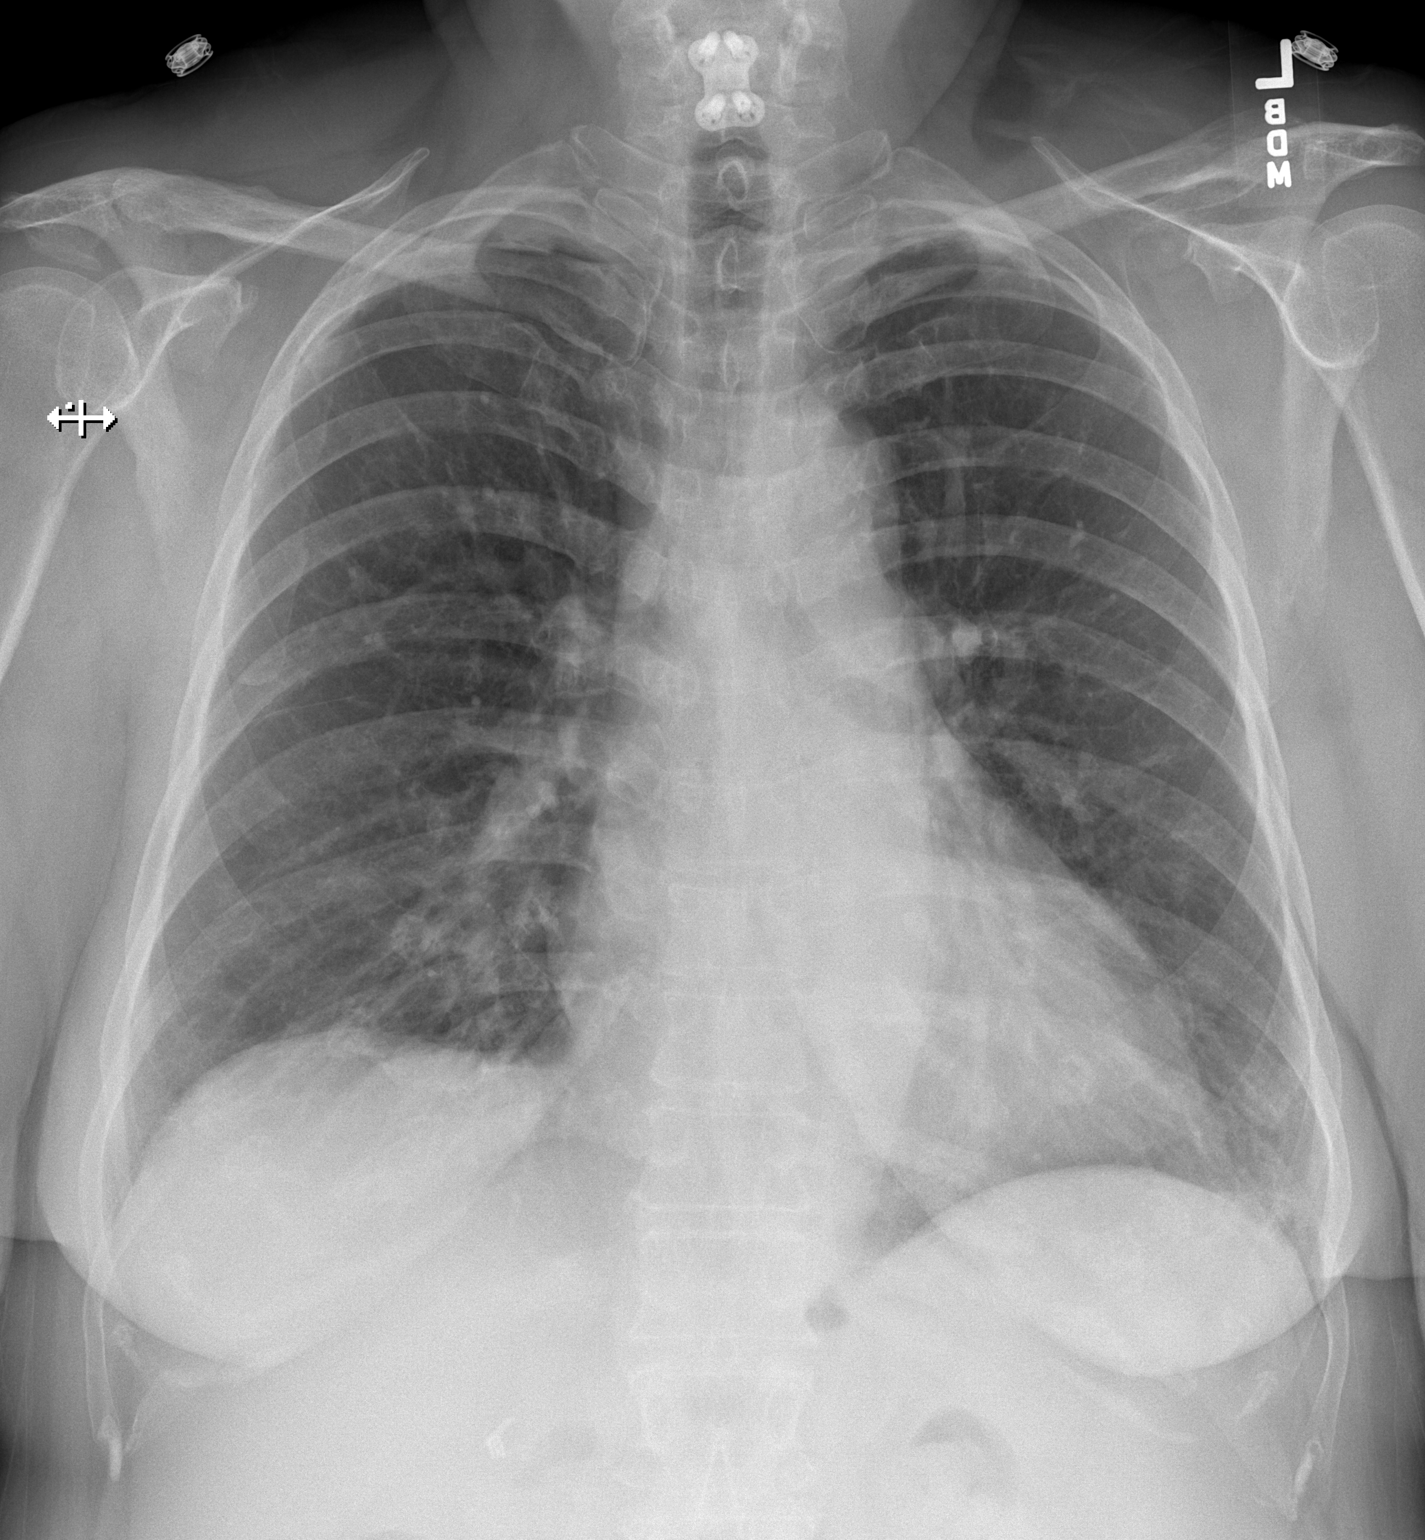

[w chest lat]
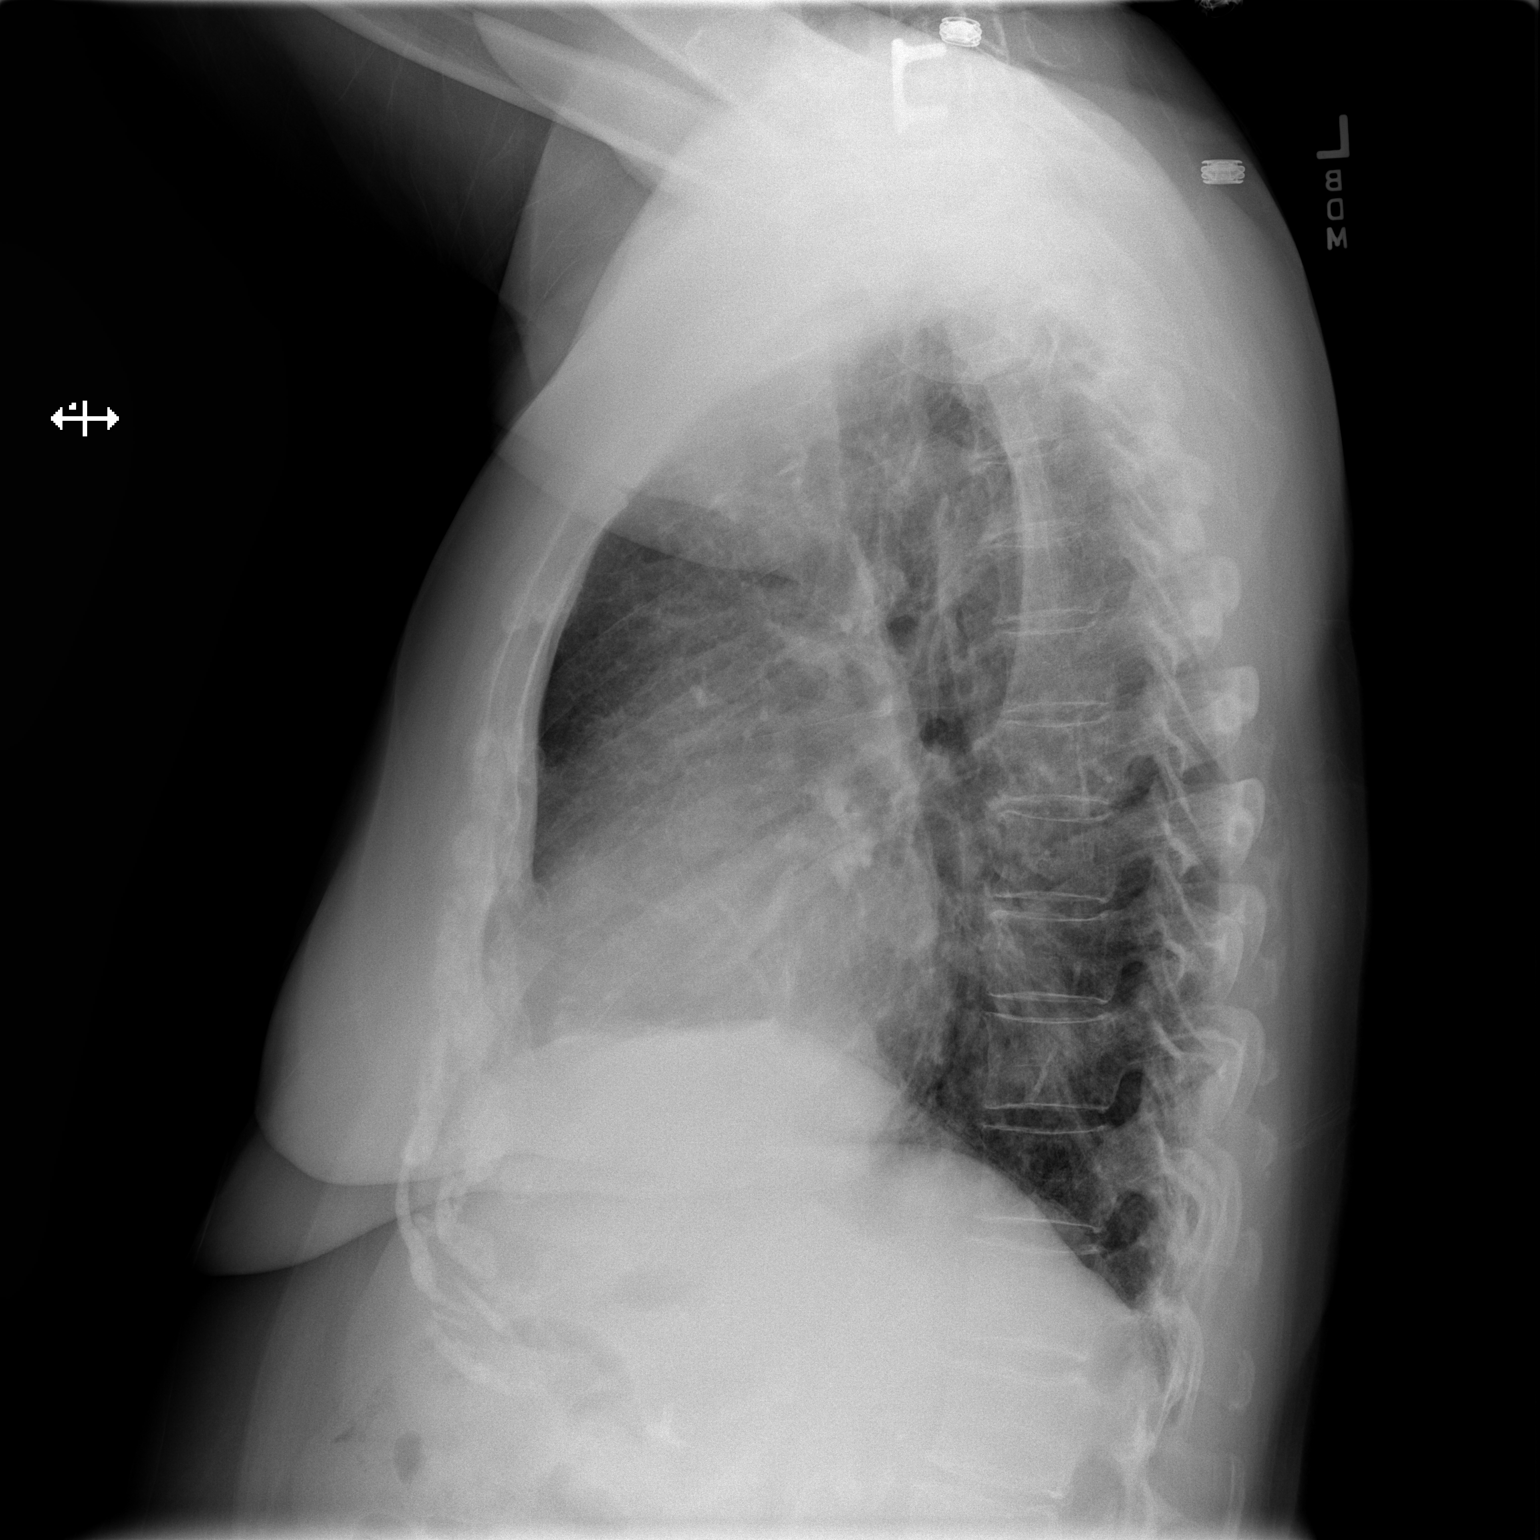

[2 of 2 positions shown; findings below may reference images not displayed]

FINDINGS: Enlargement of cardiac silhouette.
Mild elongation thoracic aorta.
Mediastinal contours and pulmonary vascularity normal.
Emphysematous and bronchitic changes.
Atelectasis versus infiltrate right lung base.
Remaining lungs clear.
Bones diffusely demineralized.
Surgical clips right upper quadrant question cholecystectomy.
IMPRESSION: Bronchitic and emphysematous changes with mild atelectasis versus
consolidation right lung base.

## 2012-02-18 IMAGING — CR DG CHEST 2V
2 series · 2 of 2 positions shown · non-contrast
Comparison: Chest radiograph 06/01/2011, CT thorax 12/22/2010

CLINICAL DATA: Chest pain, short of breath

CHEST - 2 VIEW

[w chest pa]
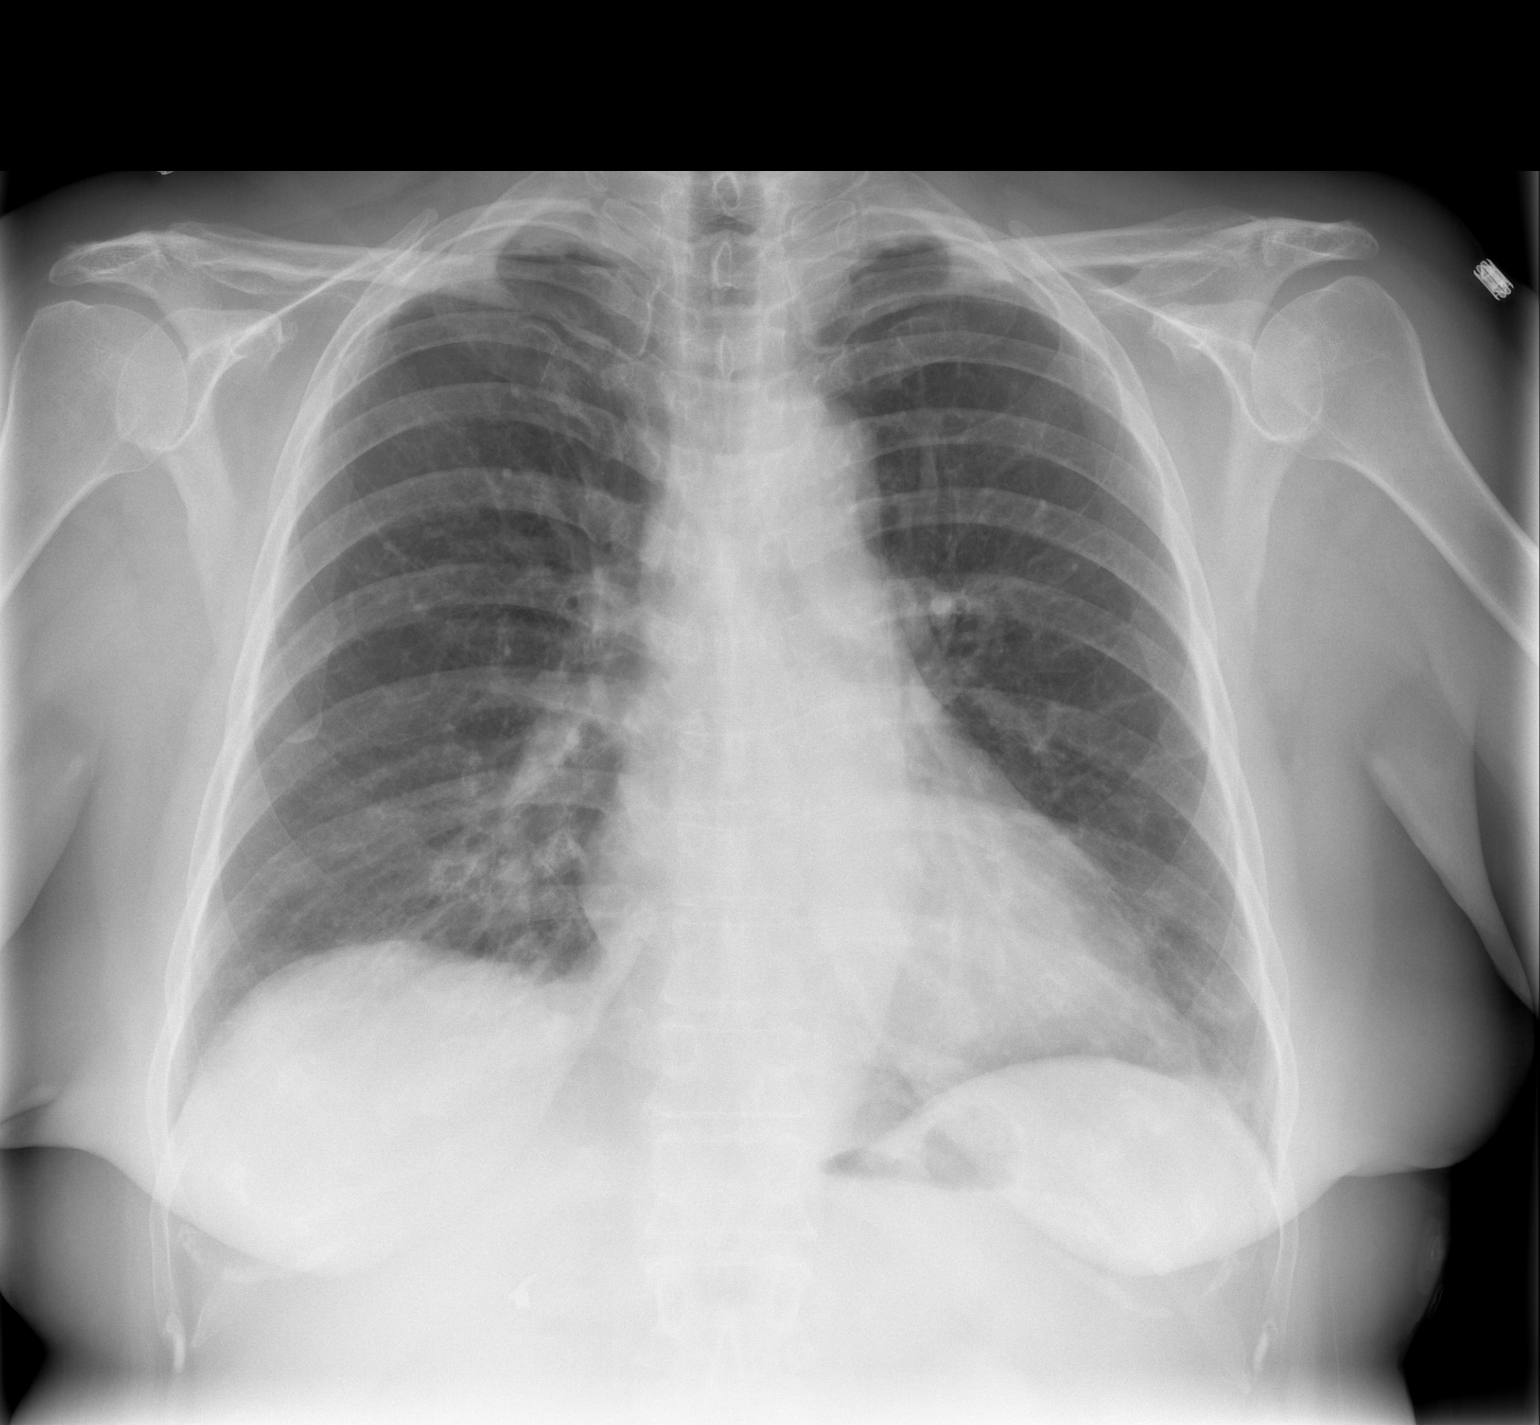

[w chest lat]
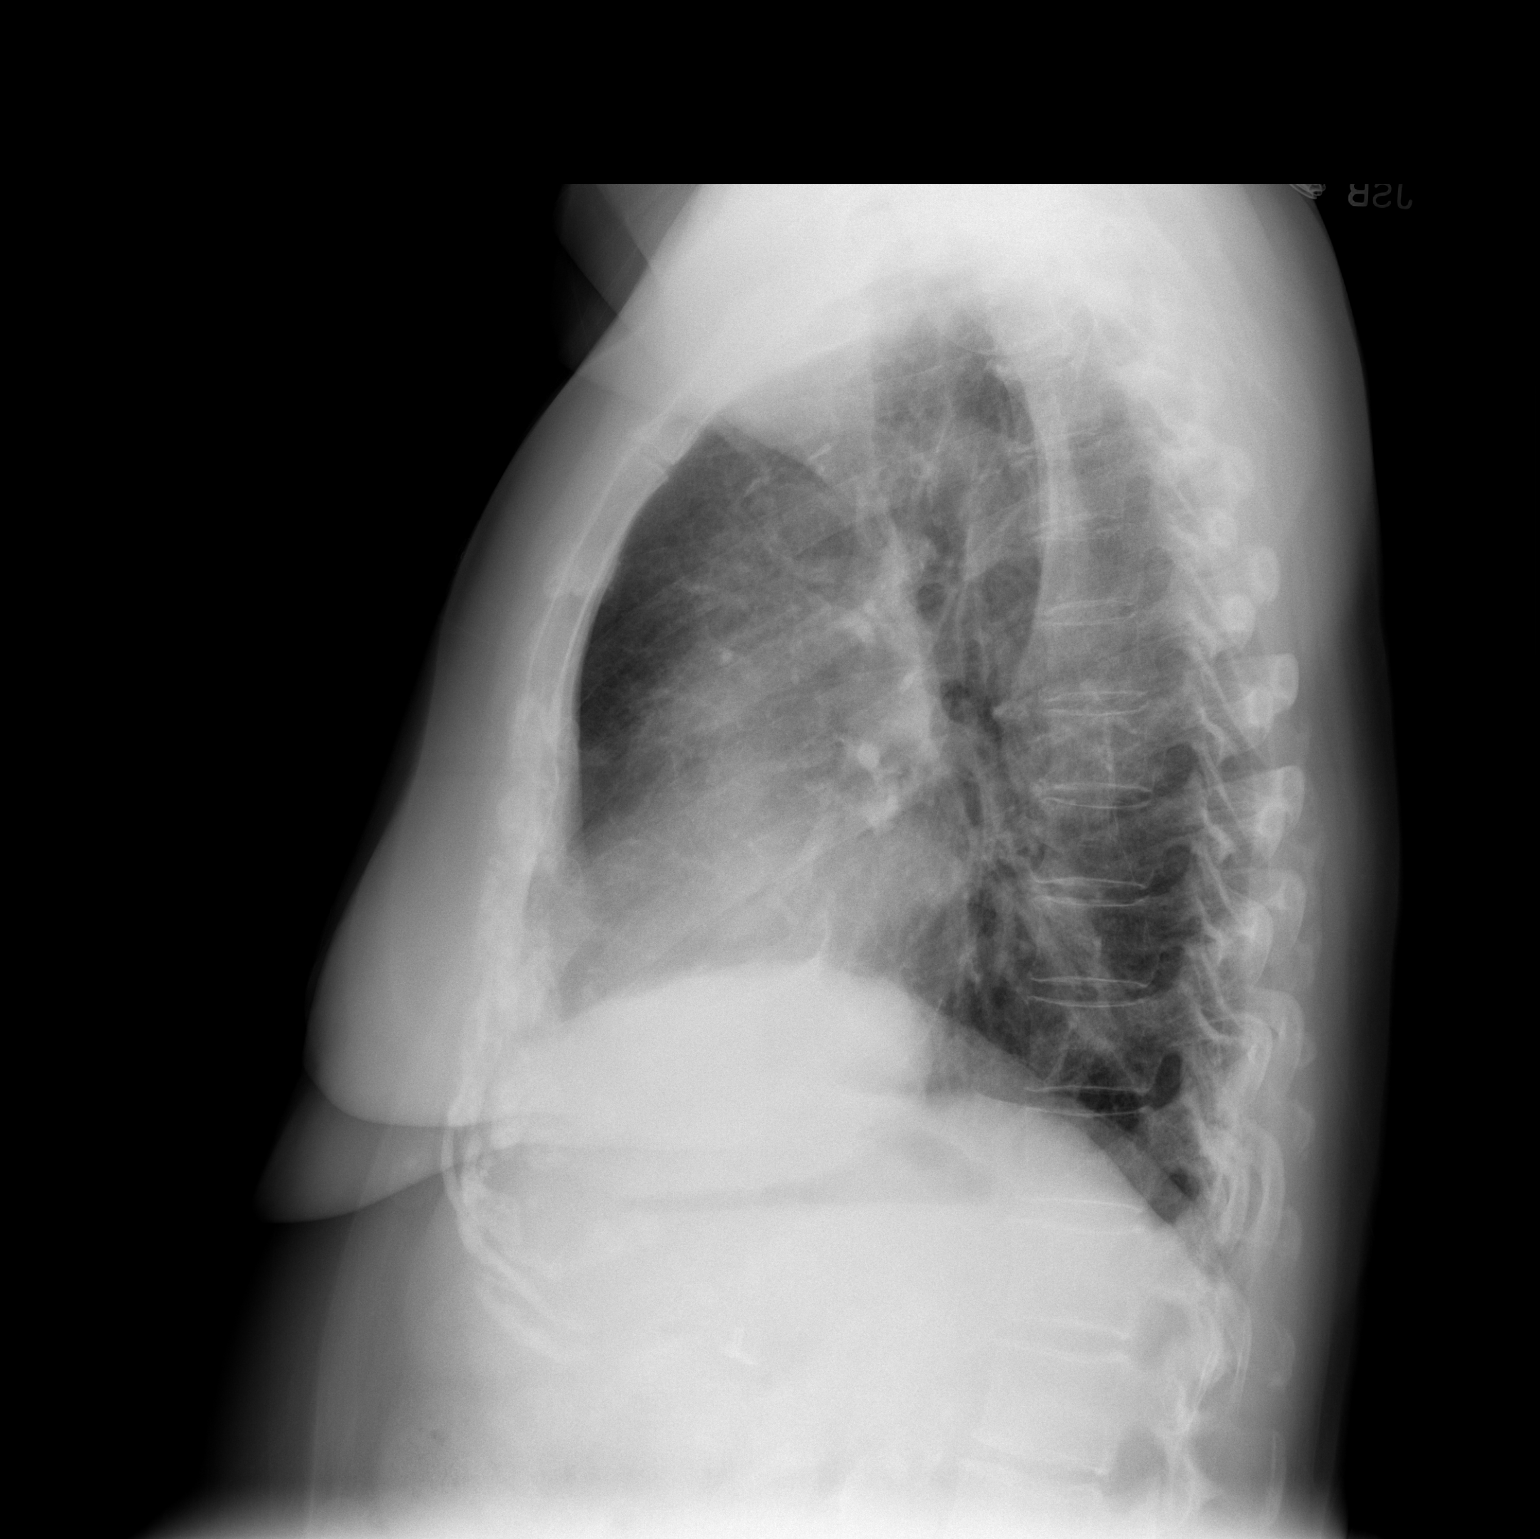

[2 of 2 positions shown; findings below may reference images not displayed]

FINDINGS: Normal mediastinum and heart silhouette.  The linear
densities at the right lung base which likely correlate to the
peripheral pleural based ground-glass opacities on comparison CT in
the right and left lower lobe.  No focal consolidation.  No
pneumothorax.  No osseous abnormality.
IMPRESSION: 1.  Stable exam of the chest.
2.  Chronic basilar subpleural opacities. See CT of 12/22/2010

## 2012-03-17 ENCOUNTER — Ambulatory Visit: Payer: Medicare Other | Admitting: Emergency Medicine

## 2012-03-24 ENCOUNTER — Telehealth: Payer: Self-pay | Admitting: Gastroenterology

## 2012-03-24 NOTE — Telephone Encounter (Signed)
error 

## 2012-04-03 ENCOUNTER — Other Ambulatory Visit: Payer: Self-pay | Admitting: Cardiology

## 2012-04-22 ENCOUNTER — Ambulatory Visit (INDEPENDENT_AMBULATORY_CARE_PROVIDER_SITE_OTHER): Payer: Medicare Other | Admitting: Gastroenterology

## 2012-04-22 ENCOUNTER — Encounter: Payer: Self-pay | Admitting: Gastroenterology

## 2012-04-22 VITALS — BP 122/80 | HR 82 | Ht 63.5 in | Wt 169.4 lb

## 2012-04-22 DIAGNOSIS — K5900 Constipation, unspecified: Secondary | ICD-10-CM

## 2012-04-22 DIAGNOSIS — R1319 Other dysphagia: Secondary | ICD-10-CM

## 2012-04-22 DIAGNOSIS — K219 Gastro-esophageal reflux disease without esophagitis: Secondary | ICD-10-CM

## 2012-04-22 MED ORDER — POLYETHYLENE GLYCOL 3350 17 GM/SCOOP PO POWD: 17.0000 g | Freq: Two times a day (BID) | ORAL | Status: AC

## 2012-04-22 MED ORDER — PANTOPRAZOLE SODIUM 40 MG PO TBEC: 40.0000 mg | DELAYED_RELEASE_TABLET | Freq: Two times a day (BID) | ORAL | Status: DC

## 2012-04-22 NOTE — Progress Notes (Signed)
History of Present Illness: This is a 71 year old female with a history of GERD and adenomatous colon polyps. She relates worsening problems with reflux and regurgitation. She notes intermittent solid food dysphagia. Followed closely by Dr. Delton Coombes for interstitial lung disease and COPD. She has ongoing problems with constipation. She underwent upper endoscopy and colonoscopy in July 2009. Upper endoscopy was normal. Colonoscopy showed decreased anal sphincter tone, an AVM, small adenomatous colon polyps and internal hemorrhoids. Denies weight loss, abdominal pain, diarrhea, change in stool caliber, melena, hematochezia, nausea, vomiting, chest pain.  Current Medications, Allergies, Past Medical History, Past Surgical History, Family History and Social History were reviewed in [employer] record.  Physical Exam: General: Well developed , well nourished, no acute distress Head: Normocephalic and atraumatic Eyes:  sclerae anicteric, EOMI Ears: Normal auditory acuity Mouth: No deformity or lesions Lungs: Decreased breath sounds with scattered rhonchi and wheezes Heart: Regular rate and rhythm; no murmurs, rubs or bruits Abdomen: Soft, non tender and non distended. No masses, hepatosplenomegaly or hernias noted. Normal Bowel sounds Musculoskeletal: Symmetrical with no gross deformities  Pulses:  Normal pulses noted Extremities: No clubbing, cyanosis, edema or deformities noted Neurological: Alert oriented x 4, grossly nonfocal Psychological:  Alert and cooperative. Normal mood and affect  Assessment and Recommendations:  1. GERD and dysphagia. Dysphagia may be regurgitation related. Rule out a peptic stricture. Schedule barium esophagram. Increase pantoprazole to 40 mg twice daily and continue ranitidine 300 mg at bedtime. If a stricture is noted consider upper endoscopy with dilation. She is at higher risk for sedation and endoscopic procedures due to her pulmonary  comorbidities.  2. Personal history of adenomatous colon polyps. Due to her pulmonary comorbidities will not plan for future surveillance colonoscopies.  3. Chronic constipation. Trial of MiraLax 1-3 times a day titrated for adequate bowel movements. 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

## 2012-04-22 NOTE — Patient Instructions (Addendum)
You have been scheduled for a Barium Esophogram at [facility] (1st floor of the hospital) on 04/24/12 at 9:30am. Please arrive 15 minutes prior to your appointment for registration. Make certain not to have anything to eat or drink 6 hours prior to your test. If you need to reschedule for any reason, please contact radiology at [phone] to do so. Increase taking your Protonix to twice daily and a new prescription has been sent to your pharmacy. Patient advised to avoid spicy, acidic, citrus, chocolate, mints, fruit and fruit juices.  Limit the intake of caffeine, alcohol and Soda.  Don't exercise too soon after eating.  Don't lie down within 3-4 hours of eating.  Elevate the head of your bed. Take over the counter Miralax mixing 17 grams in 8 oz of water 1-3 x daily for constipation. cc: Laurann Montana, MD

## 2012-04-24 ENCOUNTER — Ambulatory Visit (HOSPITAL_COMMUNITY)
Admission: RE | Admit: 2012-04-24 | Discharge: 2012-04-24 | Disposition: A | Discharge status: Home / Self Care | Payer: Medicare Other | Source: Ambulatory Visit | Attending: Gastroenterology | Admitting: Gastroenterology

## 2012-04-24 DIAGNOSIS — R1319 Other dysphagia: Secondary | ICD-10-CM

## 2012-04-24 DIAGNOSIS — R1310 Dysphagia, unspecified: Secondary | ICD-10-CM | POA: Insufficient documentation

## 2012-04-24 DIAGNOSIS — K219 Gastro-esophageal reflux disease without esophagitis: Secondary | ICD-10-CM

## 2012-05-06 ENCOUNTER — Ambulatory Visit (AMBULATORY_SURGERY_CENTER): Payer: Medicare Other

## 2012-05-06 VITALS — Ht 64.5 in | Wt 172.4 lb

## 2012-05-06 DIAGNOSIS — R1319 Other dysphagia: Secondary | ICD-10-CM

## 2012-05-06 DIAGNOSIS — K219 Gastro-esophageal reflux disease without esophagitis: Secondary | ICD-10-CM

## 2012-05-15 ENCOUNTER — Encounter: Payer: Self-pay | Admitting: Gastroenterology

## 2012-05-15 ENCOUNTER — Ambulatory Visit (AMBULATORY_SURGERY_CENTER): Payer: Medicare Other | Admitting: Gastroenterology

## 2012-05-15 VITALS — BP 147/92 | HR 96 | Temp 98.5°F | Resp 20 | Ht 64.0 in | Wt 172.0 lb

## 2012-05-15 DIAGNOSIS — R1319 Other dysphagia: Secondary | ICD-10-CM

## 2012-05-15 DIAGNOSIS — K219 Gastro-esophageal reflux disease without esophagitis: Secondary | ICD-10-CM

## 2012-05-15 DIAGNOSIS — R933 Abnormal findings on diagnostic imaging of other parts of digestive tract: Secondary | ICD-10-CM

## 2012-05-15 MED ORDER — SODIUM CHLORIDE 0.9 % IV SOLN: 500.0000 mL | INTRAVENOUS | Status: DC

## 2012-05-15 NOTE — Progress Notes (Signed)
Patient did not experience any of the following events: a burn prior to discharge; a fall within the facility; wrong site/side/patient/procedure/implant event; or a hospital transfer or hospital admission upon discharge from the facility. (G8907) Patient did not have preoperative order for IV antibiotic SSI prophylaxis. (G8918)  

## 2012-05-15 NOTE — Patient Instructions (Addendum)
YOU HAD AN ENDOSCOPIC PROCEDURE TODAY AT THE [facility]: Refer to the procedure report that was given to you for any specific questions about what was found during the examination.  If the procedure report does not answer your questions, please call your gastroenterologist to clarify.  If you requested that your care partner not be given the details of your procedure findings, then the procedure report has been included in a sealed envelope for you to review at your convenience later.  YOU SHOULD EXPECT: Some feelings of bloating in the abdomen. Passage of more gas than usual.  Walking can help get rid of the air that was put into your GI tract during the procedure and reduce the bloating.  DIET: follow dilation diet- see handout  Drink plenty of fluids but you should avoid alcoholic beverages for 24 hours.  ACTIVITY: Your care partner should take you home directly after the procedure.  You should plan to take it easy, moving slowly for the rest of the day.  You can resume normal activity the day after the procedure however you should NOT DRIVE or use heavy machinery for 24 hours (because of the sedation medicines used during the test).    SYMPTOMS TO REPORT IMMEDIATELY: A gastroenterologist can be reached at any hour.  During normal business hours, 8:30 AM to 5:00 PM Monday through Friday, call [phone].  After hours and on weekends, please call the GI answering service at [phone] who will take a message and have the physician on call contact you.   Following upper endoscopy (EGD)  Vomiting of blood or coffee ground material  New chest pain or pain under the shoulder blades  Painful or persistently difficult swallowing  New shortness of breath  Fever of 100F or higher  Black, tarry-looking stools  FOLLOW UP: If any biopsies were taken you will be contacted by phone or by letter within the next 1-3 weeks.  Call your gastroenterologist if you have not heard about the  biopsies in 3 weeks.  Our staff will call the home number listed on your records the next business day following your procedure to check on you and address any questions or concerns that you may have at that time regarding the information given to you following your procedure. This is a courtesy call and so if there is no answer at the home number and we have not heard from you through the emergency physician on call, we will assume that you have returned to your regular daily activities without incident.  SIGNATURES/CONFIDENTIALITY: You and/or your care partner have signed paperwork which will be entered into your electronic medical record.  These signatures attest to the fact that that the information above on your After Visit Summary has been reviewed and is understood.  Full responsibility of the confidentiality of this discharge information lies with you and/or your care-partner.

## 2012-05-15 NOTE — Op Note (Signed)
[facility] [address] [place], [address]   ENDOSCOPY PROCEDURE REPORT  PATIENT: [name], [name].  MR#: [id number] BIRTHDATE: 01-30-1941 , 71  yrs. old GENDER: Female ENDOSCOPIST: Meryl Dare, MD, [facility]                           n] PROCEDURE DATE:  05/15/2012 PROCEDURE:   EGD with dilatation over guidewire ASA CLASS:   Class III INDICATIONS:gerd, dysphagia and possible EGJ stricture on BA esophagram. MEDICATIONS: MAC sedation, administered by CRNA and propofol (Diprivan) 120mg  IV TOPICAL ANESTHETIC:   none DESCRIPTION OF PROCEDURE:   After the risks benefits and alternatives of the procedure were thoroughly explained, informed consent was obtained.  The [id number]-H180 [id number]  endoscope was introduced through the mouth  and advanced to the descending duodenum ,      The instrument was slowly withdrawn as the mucosa was carefully examined.   ESOPHAGUS: The mucosa of the esophagus appeared normal, including EGJ and z-line. STOMACH: The mucosa of the stomach appeared normal and the gastric folds appeared normal.  A small hiatal hernia was noted on retroflexed view. DUODENUM: The duodenal mucosa showed no abnormalities in the bulb and second portion of the duodenum.  Dilation was then performed at the gastroesphageal junction for dysphagia without a stricture. Dilator:Savary over guidewire Size17 mm  Reststance: none Heme: none  COMPLICATIONS: There were no complications.  ENDOSCOPIC IMPRESSION: 1.   Small hiatal hernia 2.   Otherwise normal EGD  RECOMMENDATIONS: 1.  anti-reflux regimen 2.  continue PPI bid and H2RA hs 3.  post dilation instructions 4.  If dysphagia persists R/O a motility disorder with an esophageal manometry  eSigned:  Meryl Dare, MD, Clementeen Graham 05/15/2012 3:05 PM  XB:[id number] Cliffton Asters, MD

## 2012-05-16 ENCOUNTER — Telehealth: Payer: Self-pay | Admitting: *Deleted

## 2012-05-16 NOTE — Telephone Encounter (Signed)
  Follow up Call-  Call back number 05/15/2012  Post procedure Call Back phone  # [phone]  Permission to leave phone message Yes     Patient questions:  Do you have a fever, pain , or abdominal swelling? no Pain Score  0 *  Have you tolerated food without any problems? yes  Have you been able to return to your normal activities? yes  Do you have any questions about your discharge instructions: Diet   no Medications  no Follow up visit  no  Do you have questions or concerns about your Care? no  Actions: * If pain score is 4 or above: No action needed, pain <4.

## 2012-06-18 ENCOUNTER — Other Ambulatory Visit (INDEPENDENT_AMBULATORY_CARE_PROVIDER_SITE_OTHER): Payer: Medicare Other

## 2012-06-18 ENCOUNTER — Ambulatory Visit (INDEPENDENT_AMBULATORY_CARE_PROVIDER_SITE_OTHER): Payer: Medicare Other | Admitting: Gastroenterology

## 2012-06-18 VITALS — BP 114/70 | HR 96 | Ht 64.5 in | Wt 161.0 lb

## 2012-06-18 DIAGNOSIS — R7402 Elevation of levels of lactic acid dehydrogenase (LDH): Secondary | ICD-10-CM

## 2012-06-18 DIAGNOSIS — K7689 Other specified diseases of liver: Secondary | ICD-10-CM

## 2012-06-18 DIAGNOSIS — R1319 Other dysphagia: Secondary | ICD-10-CM

## 2012-06-18 DIAGNOSIS — R112 Nausea with vomiting, unspecified: Secondary | ICD-10-CM

## 2012-06-18 LAB — HEPATIC FUNCTION PANEL: Total Bilirubin: 0.8 mg/dL (ref 0.3–1.2)

## 2012-06-18 LAB — PROTIME-INR
INR: 1 ratio (ref 0.8–1.0)
Prothrombin Time: 10.6 s (ref 10.2–12.4)

## 2012-06-18 LAB — FERRITIN: Ferritin: 75.1 ng/mL (ref 10.0–291.0)

## 2012-06-18 LAB — CREATININE, SERUM: Creatinine, Ser: 1.1 mg/dL (ref 0.4–1.2)

## 2012-06-18 LAB — IBC PANEL: Saturation Ratios: 17.8 % — ABNORMAL LOW (ref 20.0–50.0)

## 2012-06-18 LAB — BUN: BUN: 19 mg/dL (ref 6–23)

## 2012-06-18 MED ORDER — ONDANSETRON HCL 8 MG PO TABS: 8.0000 mg | ORAL_TABLET | Freq: Three times a day (TID) | ORAL | Status: DC

## 2012-06-18 NOTE — Progress Notes (Addendum)
History of Present Illness: This is a 71 year old female who returns today for evaluation of mildly elevated LFTs and a slightly enlarged spleen ultrasound. She has multiple complaints including looser stools for the past few days, frequent nausea and vomiting occurring while eating and after eating, intermittent dysphagia, and upper abdominal pain that is bothersome when she turns onto her side while in bed. She underwent upper endoscopy in September which was normal. Barium esophagram suggested a possible narrowing in the distal esophagus but nothing was noted an upper endoscopy. She may have an esophageal motility disorder. Transaminases were elevated in the 1-2 times normal range. Abdominal ultrasound showed fatty infiltration of liver and a slightly enlarged spleen measuring 15 cm. She was treated methotrexate with methotrexate for a few years which was stopped this spring. She also previously been treated with Cymbalta. She relates no recent new medications except for a new antibiotic for cystitis that started yesterday.  Current Medications, Allergies, Past Medical History, Past Surgical History, Family History and Social History were reviewed in [employer] record.  Physical Exam: General: Well developed , well nourished, no acute distress Head: Normocephalic and atraumatic Eyes:  sclerae anicteric, EOMI Ears: Normal auditory acuity Mouth: No deformity or lesions Lungs: Clear throughout to auscultation Heart: Regular rate and rhythm; no murmurs, rubs or bruits Abdomen: Soft, minimal upper abdominal tenderness without rebound or guarding and non distended. No masses, hepatosplenomegaly or hernias noted. Normal Bowel sounds Musculoskeletal: Symmetrical with no gross deformities  Pulses:  Normal pulses noted Extremities: No clubbing, cyanosis, edema or deformities noted Neurological: Alert oriented x 4, grossly nonfocal except for slurred speech Psychological:  Alert and  cooperative. Normal mood and affect  Assessment and Recommendations:  1. Minimally elevated transaminases. Likely secondary to fatty infiltration of the liver. History of methotrexate therapy. Several hepatitis serologies and viral serologies were negative. Obtain the other standard hepatitis serologies as well as repeat LFTs and a prothrombin time. Schedule abdominal/pelvic CT scan.  2. Mild splenomegaly. Etiology unclear. Further evaluation with CT scan.  3. Dysphagia. Suspected motility disorder. Modified diet. Consider esophageal manometry studies if her symptoms persist.  4. New nausea and vomiting. Etiology unclear. Recent upper endoscopy and abdominal ultrasound unremarkable for cause of nausea and vomiting. Continue pantoprazole twice a day. Begin Zofran before meals and as needed. Further evaluation with an abdominal/pelvic CT scan.  5. New onset loose stools. I suspect this is a manifestation of her irritable bowel syndrome. If symptoms persist will obtain stool studies and stool Hemoccults. Consider the addition of antispasmodic.

## 2012-06-18 NOTE — Patient Instructions (Addendum)
Your physician has requested that you go to the basement for lab work before leaving today.  We have sent the following medications to your pharmacy for you to pick up at your convenience: Zofran.   You have been scheduled for a CT scan of the abdomen and pelvis at [facility] ([address]---this is in the same building as [facility]ural [facility]).   You are scheduled on 06/19/12 at 11:00am. You should arrive 15 minutes prior to your appointment time for registration. Please follow the written instructions below on the day of your exam:  WARNING: IF YOU ARE ALLERGIC TO IODINE/X-RAY DYE, PLEASE NOTIFY RADIOLOGY IMMEDIATELY AT [phone]! YOU WILL BE GIVEN A 13 HOUR PREMEDICATION PREP.  1) Do not eat or drink anything after 7:00am (4 hours prior to your test) 2) You have been given 2 bottles of oral contrast to drink. The solution may taste better if refrigerated, but do NOT add ice or any other liquid to this solution. Shake well before drinking.    Drink 1 bottle of contrast @ 9:00am (2 hours prior to your exam)  Drink 1 bottle of contrast @ 10:00am (1 hour prior to your exam)  You may take any medications as prescribed with a small amount of water except for the following: Metformin, Glucophage, Glucovance, Avandamet, Riomet, Fortamet, Actoplus Met, Janumet, Glumetza or Metaglip. The above medications must be held the day of the exam AND 48 hours after the exam.  The purpose of you drinking the oral contrast is to aid in the visualization of your intestinal tract. The contrast solution may cause some diarrhea. Before your exam is started, you will be given a small amount of fluid to drink. Depending on your individual set of symptoms, you may also receive an intravenous injection of x-ray contrast/dye. Plan on being at [facility] for 30 minutes or long, depending on the type of exam you are having performed.  This test typically takes 30-45 minutes to complete.  If you  have any questions regarding your exam or if you need to reschedule, you may call the CT department at [phone] between the hours of 8:00 am and 5:00 pm, Monday-Friday.  ________________________________________________________________________  cc: Laurann Montana, MD

## 2012-06-19 ENCOUNTER — Inpatient Hospital Stay: Admission: RE | Admit: 2012-06-19 | Payer: Medicare Other | Source: Ambulatory Visit

## 2012-06-19 LAB — ANA: Anti Nuclear Antibody(ANA): POSITIVE — AB

## 2012-06-21 LAB — ALPHA-1-ANTITRYPSIN: A-1 Antitrypsin, Ser: 151 mg/dL (ref 90–200)

## 2012-06-23 ENCOUNTER — Other Ambulatory Visit: Payer: Self-pay

## 2012-06-23 ENCOUNTER — Other Ambulatory Visit: Payer: Medicare Other

## 2012-06-30 ENCOUNTER — Ambulatory Visit (INDEPENDENT_AMBULATORY_CARE_PROVIDER_SITE_OTHER)
Admission: RE | Admit: 2012-06-30 | Discharge: 2012-06-30 | Disposition: A | Discharge status: Home / Self Care | Payer: Medicare Other | Source: Ambulatory Visit | Attending: Gastroenterology | Admitting: Gastroenterology

## 2012-06-30 DIAGNOSIS — K7689 Other specified diseases of liver: Secondary | ICD-10-CM

## 2012-06-30 MED ORDER — IOHEXOL 300 MG/ML  SOLN
100.0000 mL | Freq: Once | INTRAMUSCULAR | Status: AC | PRN
  Administered 2012-06-30: 100 mL via INTRAVENOUS

## 2012-07-07 ENCOUNTER — Telehealth: Payer: Self-pay | Admitting: Gastroenterology

## 2012-07-07 NOTE — Telephone Encounter (Signed)
Patient needs to be seen after the 16th of Dec.  She needs repeat labs then REV.  I have left her a voicemail to call back to discuss

## 2012-07-08 NOTE — Telephone Encounter (Signed)
Patient rescheduled to 08/19/12

## 2012-07-08 NOTE — Telephone Encounter (Signed)
lmtcb

## 2012-07-24 ENCOUNTER — Ambulatory Visit: Payer: Medicare Other | Admitting: Gastroenterology

## 2012-07-26 ENCOUNTER — Encounter (HOSPITAL_COMMUNITY): Payer: Self-pay | Admitting: *Deleted

## 2012-07-26 ENCOUNTER — Emergency Department (HOSPITAL_COMMUNITY): Payer: Medicare Other

## 2012-07-26 ENCOUNTER — Inpatient Hospital Stay (HOSPITAL_COMMUNITY)
Admission: EM | Admit: 2012-07-26 | Discharge: 2012-07-30 | DRG: 281 | Disposition: A | Discharge status: Home / Self Care | Payer: Medicare Other | Attending: Internal Medicine | Admitting: Internal Medicine

## 2012-07-26 DIAGNOSIS — K449 Diaphragmatic hernia without obstruction or gangrene: Secondary | ICD-10-CM | POA: Diagnosis present

## 2012-07-26 DIAGNOSIS — I2510 Atherosclerotic heart disease of native coronary artery without angina pectoris: Secondary | ICD-10-CM | POA: Diagnosis present

## 2012-07-26 DIAGNOSIS — M069 Rheumatoid arthritis, unspecified: Secondary | ICD-10-CM | POA: Diagnosis present

## 2012-07-26 DIAGNOSIS — Z87891 Personal history of nicotine dependence: Secondary | ICD-10-CM

## 2012-07-26 DIAGNOSIS — I10 Essential (primary) hypertension: Secondary | ICD-10-CM | POA: Diagnosis present

## 2012-07-26 DIAGNOSIS — R9430 Abnormal result of cardiovascular function study, unspecified: Secondary | ICD-10-CM

## 2012-07-26 DIAGNOSIS — I214 Non-ST elevation (NSTEMI) myocardial infarction: Principal | ICD-10-CM | POA: Diagnosis present

## 2012-07-26 DIAGNOSIS — Z79899 Other long term (current) drug therapy: Secondary | ICD-10-CM

## 2012-07-26 DIAGNOSIS — J4489 Other specified chronic obstructive pulmonary disease: Secondary | ICD-10-CM | POA: Diagnosis present

## 2012-07-26 DIAGNOSIS — R1310 Dysphagia, unspecified: Secondary | ICD-10-CM

## 2012-07-26 DIAGNOSIS — I5181 Takotsubo syndrome: Secondary | ICD-10-CM | POA: Diagnosis present

## 2012-07-26 DIAGNOSIS — K219 Gastro-esophageal reflux disease without esophagitis: Secondary | ICD-10-CM | POA: Diagnosis present

## 2012-07-26 DIAGNOSIS — R471 Dysarthria and anarthria: Secondary | ICD-10-CM

## 2012-07-26 DIAGNOSIS — J449 Chronic obstructive pulmonary disease, unspecified: Secondary | ICD-10-CM | POA: Diagnosis present

## 2012-07-26 DIAGNOSIS — R079 Chest pain, unspecified: Secondary | ICD-10-CM

## 2012-07-26 DIAGNOSIS — Z86711 Personal history of pulmonary embolism: Secondary | ICD-10-CM

## 2012-07-26 HISTORY — DX: Takotsubo syndrome: I51.81

## 2012-07-26 HISTORY — DX: Dysphagia, unspecified: R13.10

## 2012-07-26 LAB — APTT: aPTT: 28 seconds (ref 24–37)

## 2012-07-26 LAB — BASIC METABOLIC PANEL
BUN: 16 mg/dL (ref 6–23)
CO2: 24 mEq/L (ref 19–32)
Calcium: 8.5 mg/dL (ref 8.4–10.5)
Chloride: 104 mEq/L (ref 96–112)
Creatinine, Ser: 0.82 mg/dL (ref 0.50–1.10)
Glucose, Bld: 106 mg/dL — ABNORMAL HIGH (ref 70–99)

## 2012-07-26 LAB — CBC WITH DIFFERENTIAL/PLATELET
Basophils Absolute: 0.1 10*3/uL (ref 0.0–0.1)
Eosinophils Relative: 2 % (ref 0–5)
HCT: 36.9 % (ref 36.0–46.0)
Hemoglobin: 12.7 g/dL (ref 12.0–15.0)
Lymphocytes Relative: 13 % (ref 12–46)
Lymphs Abs: 1.3 10*3/uL (ref 0.7–4.0)
MCV: 86 fL (ref 78.0–100.0)
Monocytes Absolute: 0.6 10*3/uL (ref 0.1–1.0)
Monocytes Relative: 6 % (ref 3–12)
Neutro Abs: 8 10*3/uL — ABNORMAL HIGH (ref 1.7–7.7)
RDW: 15 % (ref 11.5–15.5)
WBC: 10.2 10*3/uL (ref 4.0–10.5)

## 2012-07-26 LAB — PRO B NATRIURETIC PEPTIDE: Pro B Natriuretic peptide (BNP): 89.9 pg/mL (ref 0–125)

## 2012-07-26 MED ORDER — HEPARIN (PORCINE) IN NACL 100-0.45 UNIT/ML-% IJ SOLN
1650.0000 [IU]/h | INTRAMUSCULAR | Status: DC
  Administered 2012-07-26: 950 [IU]/h via INTRAVENOUS
  Administered 2012-07-27: 1500 [IU]/h via INTRAVENOUS
  Administered 2012-07-27: 1250 [IU]/h via INTRAVENOUS
  Filled 2012-07-26 (×4): qty 250

## 2012-07-26 MED ORDER — SODIUM CHLORIDE 0.9 % IV BOLUS (SEPSIS)
500.0000 mL | Freq: Once | INTRAVENOUS | Status: AC
  Administered 2012-07-26: 500 mL via INTRAVENOUS

## 2012-07-26 MED ORDER — IOHEXOL 350 MG/ML SOLN
80.0000 mL | Freq: Once | INTRAVENOUS | Status: AC | PRN
  Administered 2012-07-26: 80 mL via INTRAVENOUS

## 2012-07-26 MED ORDER — KETOROLAC TROMETHAMINE 30 MG/ML IJ SOLN
30.0000 mg | Freq: Once | INTRAMUSCULAR | Status: AC
  Administered 2012-07-26: 30 mg via INTRAVENOUS
  Filled 2012-07-26: qty 1

## 2012-07-26 MED ORDER — HEPARIN BOLUS VIA INFUSION
4000.0000 [IU] | Freq: Once | INTRAVENOUS | Status: AC
  Administered 2012-07-26: 4000 [IU] via INTRAVENOUS

## 2012-07-26 MED ORDER — MORPHINE SULFATE 4 MG/ML IJ SOLN
4.0000 mg | Freq: Once | INTRAMUSCULAR | Status: AC
  Administered 2012-07-26: 4 mg via INTRAVENOUS
  Filled 2012-07-26: qty 1

## 2012-07-26 MED ORDER — NITROGLYCERIN IN D5W 200-5 MCG/ML-% IV SOLN
INTRAVENOUS | Status: AC
  Administered 2012-07-26: 50000 ug via INTRAVENOUS
  Filled 2012-07-26: qty 250

## 2012-07-26 MED ORDER — NITROGLYCERIN IN D5W 200-5 MCG/ML-% IV SOLN
5.0000 ug/min | INTRAVENOUS | Status: DC
  Administered 2012-07-26: 50000 ug via INTRAVENOUS

## 2012-07-26 MED ORDER — CLOPIDOGREL BISULFATE 300 MG PO TABS
600.0000 mg | ORAL_TABLET | Freq: Once | ORAL | Status: AC
  Administered 2012-07-27: 600 mg via ORAL
  Filled 2012-07-26: qty 2

## 2012-07-26 NOTE — Progress Notes (Signed)
ANTICOAGULATION CONSULT NOTE - Initial Consult  Pharmacy Consult for Heparin Indication: chest pain/ACS  Allergies  Allergen Reactions  . Levofloxacin Swelling  . Nitrofurantoin     *MACROBID*  Tongue swelling    Patient Measurements: Height: 5' 4.5" (163.8 cm) Weight: 149 lb (67.586 kg) IBW/kg (Calculated) : 55.85  Heparin Dosing Weight: 68 Kg  Vital Signs: Temp: 97 F (36.1 C) (12/14 1735) Temp src: Oral (12/14 1735) BP: 108/61 mmHg (12/14 1735) Pulse Rate: 83  (12/14 1735)  Labs:  Basename 07/26/12 1800  HGB 12.7  HCT 36.9  PLT 246  APTT 28  LABPROT 13.4  INR 1.03  HEPARINUNFRC --  CREATININE 0.82  CKTOTAL --  CKMB --  TROPONINI 0.71*    The CrCl is unknown because both a height and weight (above a minimum accepted value) are required for this calculation.   Medical History: Past Medical History  Diagnosis Date  . Pulmonary embolism   . Rheumatoid arthritis   . Hypertension   . Incontinence of feces   . Unspecified disorder of bladder   . Rectal prolapse   . Hiatal hernia   . IBS (irritable bowel syndrome)   . GERD (gastroesophageal reflux disease)   . Pain in limb   . Lumbago     hospital admission with back pain in April, 2011 repair of a disc herniation L4-L5   . Osteoarthrosis, unspecified whether generalized or localized, unspecified site   . COPD (chronic obstructive pulmonary disease)   . Tobacco use disorder   . CAD (coronary artery disease)     Nuclear, May, 2010, no ischemia / catheterization 2012, nonobstructive CAD ( 40% circumflex, 40% LAD) EF normal  . Carotid artery disease     Doppler, July, 2010, 40-59% bilateral, stable, followup one year  . Adenomatous polyp 02/2008  . AVM (arteriovenous malformation)     Small colonic  . Ejection fraction     EF 55-60%, echo, April, 2011, grade 1 diastolic dysfunction.  . Shortness of breath     Cardiac evaluation, Dr.Katz, pulmonary evaluation, 2012, Dr. Delton Coombes.  Marland Kitchen UTI (lower urinary tract  infection)     Possible UTI by history June 14, 2011,   . Gout   . Shoulder pain, right   . Spinal stenosis   . Hyperlipidemia   . Glaucoma   . PE (pulmonary embolism)   . Splenomegaly   . Fatty infiltration of liver   . Cystitis     Medications:  Vit C, asa, Vit D, B12, lasix, medrol, pantoprazole, ntg, KCL, zantac, trimethoprim  Assessment: [name] is admitted with chest pain x 1.5h duration. Noted she took ntg and ASA PTA. Baseline labs, particularly H/H, plts, INR and Scr are nml. Will initiate heparin per ACS protocol.  Goal of Therapy:  Heparin level 0.3-0.7 units/ml Monitor platelets by anticoagulation protocol: Yes   Plan:  - Heparin 4000 units IV bolus x 1 now - Heparin IV infusion at 950 units/hr - Check heparin level 6 hours after drip starts - Check Daily heparin level and CBC  Thanks, Chyrl Elwell K. Allena Katz, PharmD, BCPS.  Clinical Pharmacist Pager [phone]. 07/26/2012 6:58 PM

## 2012-07-26 NOTE — H&P (Signed)
Cardiology History and Physical  WHITE,CYNTHIA S, MD  History of Present Illness (and review of medical records): [name] is a 71 y.o. female who presents for evaluation of chest pain.  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  She reports acute onset of chest pain at rest today around 3pm.  Pain was mid-sternal and rated 10/10.  She took 3 NTG at that time.  Pain came down to 3-4/10 over about an hour.  However pain returned to 10/10.  She reported associated radiation to jaw and left arm, shortness of breath, diaphoresis, and N/V.  She took ASA 325 and daughter drove her to ED.  She is chest pain free now after NTG, morphine and Toradol.  She underwent CTA which was negative for PE.  She had positive troponin and Cardiology was called for admission.  Review of Systems Further review of systems was otherwise negative other than stated in HPI.  Patient Active Problem List   Diagnosis Date Noted  . Interstitial lung disease 07/02/2011  . UTI (lower urinary tract infection)   . Shortness of breath   . Ejection fraction   . Carotid artery disease   . Pulmonary embolism   . Rheumatoid arthritis   . Hypertension   . COPD (chronic obstructive pulmonary disease)   . Tobacco use disorder   . CAD (coronary artery disease)   . AVM (arteriovenous malformation)   . PREMATURE VENTRICULAR CONTRACTIONS 10/11/2010  . OSTEOARTHRITIS 07/24/2010  . HEMORRHOIDS-INTERNAL 07/24/2010  . ANGIODYSPLASIA-INTESTINE 07/24/2010  . ABDOMINAL PAIN, LEFT UPPER QUADRANT 07/24/2010  . PERSONAL HISTORY OF COLONIC POLYPS 07/24/2010  . RHEUMATOID ARTHRITIS 06/30/2010  . CHEST PAIN 12/20/2008  . PULMONARY EMBOLISM, HX OF 11/16/2008  . Other acquired absence of organ 11/16/2008  . HYPERTENSION 03/10/2008  . RECTAL PROLAPSE 02/10/2008  . BLADDER PROLAPSE 02/10/2008  . INCONTINENCE OF FECES 02/10/2008  . GASTROESOPHAGEAL REFLUX DISEASE 02/09/2008  . HIATAL HERNIA 02/09/2008  . IRRITABLE  BOWEL SYNDROME 02/09/2008  . TOBACCO USE DISORDER/SMOKER-SMOKING CESSATION DISCUSSED 01/14/2008  . RECTAL PAIN 01/14/2008  . HEMATOCHEZIA 01/14/2008  . ELEVATED BP 01/14/2008  . COPD 07/17/2007  . LEG PAIN 07/17/2007   Past Medical History  Diagnosis Date  . Pulmonary embolism   . Rheumatoid arthritis   . Hypertension   . Incontinence of feces   . Unspecified disorder of bladder   . Rectal prolapse   . Hiatal hernia   . IBS (irritable bowel syndrome)   . GERD (gastroesophageal reflux disease)   . Pain in limb   . Lumbago     hospital admission with back pain in April, 2011 repair of a disc herniation L4-L5   . Osteoarthrosis, unspecified whether generalized or localized, unspecified site   . COPD (chronic obstructive pulmonary disease)   . Tobacco use disorder   . CAD (coronary artery disease)     Nuclear, May, 2010, no ischemia / catheterization 2012, nonobstructive CAD ( 40% circumflex, 40% LAD) EF normal  . Carotid artery disease     Doppler, July, 2010, 40-59% bilateral, stable, followup one year  . Adenomatous polyp 02/2008  . AVM (arteriovenous malformation)     Small colonic  . Ejection fraction     EF 55-60%, echo, April, 2011, grade 1 diastolic dysfunction.  . Shortness of breath     Cardiac evaluation, Dr.Katz, pulmonary evaluation, 2012, Dr. Delton Coombes.  Marland Kitchen UTI (lower urinary tract infection)     Possible UTI by history June 14, 2011,   .  Gout   . Shoulder pain, right   . Spinal stenosis   . Hyperlipidemia   . Glaucoma   . PE (pulmonary embolism)   . Splenomegaly   . Fatty infiltration of liver   . Cystitis     Past Surgical History  Procedure Date  . Laminectomy     lumbar, HX of L4-5 x 2  . Cervical disc compression     c5-c6 Hx of  . Appendectomy   . Cholecystectomy   . Bladder surgery     x 2  . Vaginal hysterectomy   . Rectocele repair   . Interstim medronic stimulator 04/2010     (Not in a hospital admission) Allergies  Allergen Reactions   . Levofloxacin Swelling  . Nitrofurantoin     *MACROBID*  Tongue swelling    History  Substance Use Topics  . Smoking status: Former Smoker -- 1.0 packs/day for 30 years    Quit date: 08/14/2007  . Smokeless tobacco: Never Used  . Alcohol Use: No    Family History  Problem Relation Age of Onset  . Arthritis Brother   . Colon cancer Paternal Grandmother   . Lung cancer Father     x 3 uncles and a aunt  . Brain cancer Father   . Heart disease Mother   . Kidney disease Mother     chronic UTI  . Diabetes Mother   . Diabetes Sister   . Ovarian cancer Maternal Aunt   . Heart disease Maternal Uncle   . Kidney cancer Maternal Uncle   . Heart disease Paternal Aunt   . Pancreatic cancer Maternal Grandmother      Objective: Patient Vitals for the past 8 hrs:  BP Temp Temp src Pulse Resp SpO2 Height Weight  07/26/12 1902 - - - - - - 5' 4.5" (1.638 m) 67.586 kg (149 lb)  07/26/12 1900 - - - - - - 5' 4.5" (1.638 m) 67.586 kg (149 lb)  07/26/12 1735 108/61 mmHg 97 F (36.1 C) Oral 83  22  100 % - -   General Appearance:    Alert, cooperative, no distress, appears stated age  Head:    Normocephalic, without obvious abnormality, atraumatic  Eyes:     PERRL, EOMI, anicteric sclerae  Neck:   Supple, no carotid bruit or JVD  Lungs:     Clear to auscultation bilaterally, respirations unlabored  Heart:    Regular rate and rhythm, S1 and S2 normal, no murmur  Abdomen:     Soft, non-tender, normoactive bowel sounds  Extremities:   Extremities normal, atraumatic, no cyanosis or edema  Pulses:   2+ and symmetric all extremities  Skin:   no rashes or lesions  Neurologic:   No focal deficits. Cranial nerves intact. AAO x3   Results for orders placed during the hospital encounter of 07/26/12 (from the past 48 hour(s))  CBC WITH DIFFERENTIAL     Status: Abnormal   Collection Time   07/26/12  6:00 PM      Component Value Range Comment   WBC 10.2  4.0 - 10.5 K/uL    RBC 4.29  3.87 - 5.11  MIL/uL    Hemoglobin 12.7  12.0 - 15.0 g/dL    HCT 16.1  09.6 - 04.5 %    MCV 86.0  78.0 - 100.0 fL    MCH 29.6  26.0 - 34.0 pg    MCHC 34.4  30.0 - 36.0 g/dL    RDW 15.0  11.5 - 15.5 %    Platelets 246  150 - 400 K/uL    Neutrophils Relative 78 (*) 43 - 77 %    Neutro Abs 8.0 (*) 1.7 - 7.7 K/uL    Lymphocytes Relative 13  12 - 46 %    Lymphs Abs 1.3  0.7 - 4.0 K/uL    Monocytes Relative 6  3 - 12 %    Monocytes Absolute 0.6  0.1 - 1.0 K/uL    Eosinophils Relative 2  0 - 5 %    Eosinophils Absolute 0.2  0.0 - 0.7 K/uL    Basophils Relative 1  0 - 1 %    Basophils Absolute 0.1  0.0 - 0.1 K/uL   BASIC METABOLIC PANEL     Status: Abnormal   Collection Time   07/26/12  6:00 PM      Component Value Range Comment   Sodium 139  135 - 145 mEq/L    Potassium 3.1 (*) 3.5 - 5.1 mEq/L    Chloride 104  96 - 112 mEq/L    CO2 24  19 - 32 mEq/L    Glucose, Bld 106 (*) 70 - 99 mg/dL    BUN 16  6 - 23 mg/dL    Creatinine, Ser 4.09  0.50 - 1.10 mg/dL    Calcium 8.5  8.4 - 81.1 mg/dL    GFR calc non Af Amer 71 (*) >90 mL/min    GFR calc Af Amer 83 (*) >90 mL/min   TROPONIN I     Status: Abnormal   Collection Time   07/26/12  6:00 PM      Component Value Range Comment   Troponin I 0.71 (*) <0.30 ng/mL   PROTIME-INR     Status: Normal   Collection Time   07/26/12  6:00 PM      Component Value Range Comment   Prothrombin Time 13.4  11.6 - 15.2 seconds    INR 1.03  0.00 - 1.49   APTT     Status: Normal   Collection Time   07/26/12  6:00 PM      Component Value Range Comment   aPTT 28  24 - 37 seconds   PRO B NATRIURETIC PEPTIDE     Status: Normal   Collection Time   07/26/12  6:00 PM      Component Value Range Comment   Pro B Natriuretic peptide (BNP) 89.9  0 - 125 pg/mL    Ct Head Wo Contrast  07/26/2012  *RADIOLOGY REPORT*  Clinical Data: Slurred speech, hypertension, history smoking, coronary artery disease  CT HEAD WITHOUT CONTRAST  Technique:  Contiguous axial images were  obtained from the base of the skull through the vertex without contrast.  Comparison: None.  Findings: Normal ventricular morphology. No midline shift or mass effect. Normal appearance of brain parenchyma. No intracranial hemorrhage, mass lesion, or acute infarction. Visualized paranasal sinuses and mastoid air cells clear. Bones unremarkable.  IMPRESSION: No acute intracranial abnormalities.   Original Report Authenticated By: Ulyses Southward, M.D.    Ct Angio Chest Pe W/cm &/or Wo Cm  07/26/2012  *RADIOLOGY REPORT*  Clinical Data: Sudden onset chest pain 2 hours ago, nausea, vomiting, increased pain with movement, past history of cholecystectomy, gout, pulmonary embolism  CT ANGIOGRAPHY CHEST  Technique:  Multidetector CT imaging of the chest using the standard protocol during bolus administration of intravenous contrast. Multiplanar reconstructed images including MIPs were obtained and reviewed to evaluate the vascular anatomy.  Contrast:  80 ml Omnipaque-350 IV  Comparison: CT chest 01/03/2012  Findings: Scattered atherosclerotic calcifications aorta and coronary arteries. No aortic aneurysm or dissection. Scattered normal-sized mediastinal lymph nodes. Spleen appears enlarged, 12.5 x 8.9 cm image 89, length not fully imaged. Pulmonary arteries patent. No evidence pulmonary embolism. Minimal dependent atelectasis in both lungs. No acute infiltrate, pleural effusion or pneumothorax. Osseous structures unremarkable.  IMPRESSION: No evidence pulmonary embolism. Dependent atelectasis in both lungs. Scattered atherosclerotic disease changes of the aorta and coronary arteries. Question splenomegaly, incompletely imaged.   Original Report Authenticated By: Ulyses Southward, M.D.    Dg Chest Port 1 View  07/26/2012  *RADIOLOGY REPORT*  Clinical Data: Short of breath.  Cough.  Chest pain.  PORTABLE CHEST - 1 VIEW  Comparison: 06/03/2011  Findings: Chronic interstitial prominence in the right lung base is stable.  No  evidence of acute infiltrate or pleural effusion. Heart size is stable and within normal limits.  IMPRESSION: Stable chronic interstitial prominence in right lung base.  No acute findings.   Original Report Authenticated By: Myles Rosenthal, M.D.     ECG: ECHO 11/2011: Study Conclusions - Left ventricle: The cavity size was normal. Wall thickness was normal. Systolic function was normal. The estimated ejection fraction was in the range of 60% to 65%. Wall motion was normal; there were no regional wall motion abnormalities. Doppler parameters are consistent with abnormal left ventricular relaxation (grade 1 diastolic dysfunction). - Left atrium: The atrium was mildly dilated.  Assessment: 69F hx of non-obstructive ASCAD presents with typical chest pain, TWA on ecg with positive troponins consistent with ACS/NSTEMI  Plan: 1. Admit to [facility] 2. Continuous monitoring on Telemetry. 3. Repeat ekg on admit, prn chest pain or arrythmia 4. Trend cardiac biomarkers, check lipids, hgba1c, tsh 5. Medical management to include ASA, Plavix load, Heparin gtt, BB, Statin, NTG prn 6. TTE in am assess wall motion and LV function 7. Likely need further ischemic evaluation with cardiac catheterization.

## 2012-07-26 NOTE — ED Notes (Signed)
Pt arrived by [facility] for cp x 1.5 hours, increases with movement, having n/v. Took 3 nitro at home, ems gave her 3 more pta and 324mg  asa. Iv started pta.

## 2012-07-26 NOTE — ED Notes (Signed)
Pt reports no chest pain but states that her arms still are achy.

## 2012-07-26 NOTE — ED Provider Notes (Signed)
History     CSN: [id number]  Arrival date & time 07/26/12  1722   First MD Initiated Contact with Patient 07/26/12 1728      Chief Complaint  Patient presents with  . Chest Pain    (Consider location/radiation/quality/duration/timing/severity/associated sxs/prior treatment) HPI Pt with numerous medical problems reports sudden onset of severe aching/pressure mid sternal chest pain, associated with SOB, worse with deep breath and palpation. Relieved temporarily by NTG taken at home but returned shortly thereafter. She was given additional NTG en route with improvement again temporarily. Pain is 9/10 at the time of my evaluation. Denies any prior stents or CABG but has had PE in the distant past.   Past Medical History  Diagnosis Date  . Pulmonary embolism   . Rheumatoid arthritis   . Hypertension   . Incontinence of feces   . Unspecified disorder of bladder   . Rectal prolapse   . Hiatal hernia   . IBS (irritable bowel syndrome)   . GERD (gastroesophageal reflux disease)   . Pain in limb   . Lumbago     hospital admission with back pain in April, 2011 repair of a disc herniation L4-L5   . Osteoarthrosis, unspecified whether generalized or localized, unspecified site   . COPD (chronic obstructive pulmonary disease)   . Tobacco use disorder   . CAD (coronary artery disease)     Nuclear, May, 2010, no ischemia / catheterization 2012, nonobstructive CAD ( 40% circumflex, 40% LAD) EF normal  . Carotid artery disease     Doppler, July, 2010, 40-59% bilateral, stable, followup one year  . Adenomatous polyp 02/2008  . AVM (arteriovenous malformation)     Small colonic  . Ejection fraction     EF 55-60%, echo, April, 2011, grade 1 diastolic dysfunction.  . Shortness of breath     Cardiac evaluation, Dr.Katz, pulmonary evaluation, 2012, Dr. Delton Coombes.  Marland Kitchen UTI (lower urinary tract infection)     Possible UTI by history June 14, 2011,   . Gout   . Shoulder pain, right   . Spinal  stenosis   . Hyperlipidemia   . Glaucoma   . PE (pulmonary embolism)   . Splenomegaly   . Fatty infiltration of liver   . Cystitis     Past Surgical History  Procedure Date  . Laminectomy     lumbar, HX of L4-5 x 2  . Cervical disc compression     c5-c6 Hx of  . Appendectomy   . Cholecystectomy   . Bladder surgery     x 2  . Vaginal hysterectomy   . Rectocele repair   . Interstim medronic stimulator 04/2010    Family History  Problem Relation Age of Onset  . Arthritis Brother   . Colon cancer Paternal Grandmother   . Lung cancer Father     x 3 uncles and a aunt  . Brain cancer Father   . Heart disease Mother   . Kidney disease Mother     chronic UTI  . Diabetes Mother   . Diabetes Sister   . Ovarian cancer Maternal Aunt   . Heart disease Maternal Uncle   . Kidney cancer Maternal Uncle   . Heart disease Paternal Aunt   . Pancreatic cancer Maternal Grandmother     History  Substance Use Topics  . Smoking status: Former Smoker -- 1.0 packs/day for 30 years    Quit date: 08/14/2007  . Smokeless tobacco: Never Used  . Alcohol Use: No  OB History    Grav Para Term Preterm Abortions TAB SAB Ect Mult Living                  Review of Systems All other systems reviewed and are negative except as noted in HPI.   Allergies  Levofloxacin and Nitrofurantoin  Home Medications   Current Outpatient Rx  Name  Route  Sig  Dispense  Refill  . ALBUTEROL SULFATE HFA 108 (90 BASE) MCG/ACT IN AERS   Inhalation   Inhale 2 puffs into the lungs every 6 (six) hours as needed for wheezing.   1 Inhaler   6   . VITAMIN C 500 MG PO TABS   Oral   Take 1,000 mg by mouth daily.          . ASPIRIN 81 MG PO TABS   Oral   Take 81 mg by mouth daily.           Marland Kitchen VITAMIN D 1000 UNITS PO CAPS   Oral   Take 1,000 Units by mouth 2 (two) times daily.           . COLCHICINE 0.6 MG PO TABS   Oral   Take by mouth daily.          . CYANOCOBALAMIN 2000 MCG PO TABS    Oral   Take 2,000 mcg by mouth daily.           . FUROSEMIDE 40 MG PO TABS      TAKE 1 TABLET BY MOUTH DAILY.   90 tablet   2   . NITROGLYCERIN 0.4 MG SL SUBL   Sublingual   Place 1 tablet (0.4 mg total) under the tongue every 5 (five) minutes as needed for chest pain.   25 tablet   6   . NYSTATIN 100000 UNIT/ML MT SUSP               . ONDANSETRON HCL 8 MG PO TABS   Oral   Take 1 tablet (8 mg total) by mouth 3 (three) times daily before meals.   90 tablet   11   . PANTOPRAZOLE SODIUM 40 MG PO TBEC   Oral   Take 1 tablet (40 mg total) by mouth 2 (two) times daily.   60 tablet   11   . POTASSIUM CHLORIDE 10 MEQ PO TBCR   Oral   Take 10 mEq by mouth daily.          Marland Kitchen RANITIDINE HCL 150 MG PO CAPS   Oral   Take 300 mg by mouth every evening.          Marland Kitchen TRIMETHOPRIM 100 MG PO TABS   Oral   Take 100 mg by mouth daily.           BP 108/61  Pulse 83  Temp 97 F (36.1 C) (Oral)  Resp 22  SpO2 100%  Physical Exam  Nursing note and vitals reviewed. Constitutional: She is oriented to person, place, and time. She appears well-developed and well-nourished.  HENT:  Head: Normocephalic and atraumatic.  Eyes: EOM are normal. Pupils are equal, round, and reactive to light.  Neck: Normal range of motion. Neck supple.  Cardiovascular: Normal rate, normal heart sounds and intact distal pulses.   Pulmonary/Chest: Effort normal and breath sounds normal. She exhibits tenderness.  Abdominal: Bowel sounds are normal. She exhibits no distension. There is no tenderness.  Musculoskeletal: Normal range of motion. She exhibits no edema and no  tenderness.  Neurological: She is alert and oriented to person, place, and time. She has normal strength. No cranial nerve deficit or sensory deficit.  Skin: Skin is warm and dry. No rash noted.  Psychiatric: She has a normal mood and affect.    ED Course  Procedures (including critical care time)  Labs Reviewed  CBC WITH  DIFFERENTIAL - Abnormal; Notable for the following:    Neutrophils Relative 78 (*)     Neutro Abs 8.0 (*)     All other components within normal limits  BASIC METABOLIC PANEL - Abnormal; Notable for the following:    Potassium 3.1 (*)     Glucose, Bld 106 (*)     GFR calc non Af Amer 71 (*)     GFR calc Af Amer 83 (*)     All other components within normal limits  TROPONIN I - Abnormal; Notable for the following:    Troponin I 0.71 (*)     All other components within normal limits  PROTIME-INR  APTT  PRO B NATRIURETIC PEPTIDE  HEPARIN LEVEL (UNFRACTIONATED)  HEPARIN LEVEL (UNFRACTIONATED)  CBC  TROPONIN I   Ct Head Wo Contrast  07/26/2012  *RADIOLOGY REPORT*  Clinical Data: Slurred speech, hypertension, history smoking, coronary artery disease  CT HEAD WITHOUT CONTRAST  Technique:  Contiguous axial images were obtained from the base of the skull through the vertex without contrast.  Comparison: None.  Findings: Normal ventricular morphology. No midline shift or mass effect. Normal appearance of brain parenchyma. No intracranial hemorrhage, mass lesion, or acute infarction. Visualized paranasal sinuses and mastoid air cells clear. Bones unremarkable.  IMPRESSION: No acute intracranial abnormalities.   Original Report Authenticated By: Ulyses Southward, M.D.    Ct Angio Chest Pe W/cm &/or Wo Cm  07/26/2012  *RADIOLOGY REPORT*  Clinical Data: Sudden onset chest pain 2 hours ago, nausea, vomiting, increased pain with movement, past history of cholecystectomy, gout, pulmonary embolism  CT ANGIOGRAPHY CHEST  Technique:  Multidetector CT imaging of the chest using the standard protocol during bolus administration of intravenous contrast. Multiplanar reconstructed images including MIPs were obtained and reviewed to evaluate the vascular anatomy.  Contrast:  80 ml Omnipaque-350 IV  Comparison: CT chest 01/03/2012  Findings: Scattered atherosclerotic calcifications aorta and coronary arteries. No aortic  aneurysm or dissection. Scattered normal-sized mediastinal lymph nodes. Spleen appears enlarged, 12.5 x 8.9 cm image 89, length not fully imaged. Pulmonary arteries patent. No evidence pulmonary embolism. Minimal dependent atelectasis in both lungs. No acute infiltrate, pleural effusion or pneumothorax. Osseous structures unremarkable.  IMPRESSION: No evidence pulmonary embolism. Dependent atelectasis in both lungs. Scattered atherosclerotic disease changes of the aorta and coronary arteries. Question splenomegaly, incompletely imaged.   Original Report Authenticated By: Ulyses Southward, M.D.    Dg Chest Port 1 View  07/26/2012  *RADIOLOGY REPORT*  Clinical Data: Short of breath.  Cough.  Chest pain.  PORTABLE CHEST - 1 VIEW  Comparison: 06/03/2011  Findings: Chronic interstitial prominence in the right lung base is stable.  No evidence of acute infiltrate or pleural effusion. Heart size is stable and within normal limits.  IMPRESSION: Stable chronic interstitial prominence in right lung base.  No acute findings.   Original Report Authenticated By: Myles Rosenthal, M.D.      No diagnosis found.    MDM   Date: 07/26/2012  Rate: 78  Rhythm: indeterminate and regular but no P-waves present, ?junctional  QRS Axis: right  Intervals: normal  ST/T Wave abnormalities: nonspecific T wave  changes  Conduction Disutrbances:nonspecific intraventricular conduction delay  Narrative Interpretation:   Old EKG Reviewed: changes noted, IVCD is new, previous in NSR  Family at bedside expresses additional concerns unrelated to the patient's CP including difficulty with speech, unable to pucker lips and bilateral arm weakness for the last week. These concerns were apparently brought up to PCP at a visit earlier this week, but it is unclear if they were addressed at that time.    7:05 PM Pain improved Positive troponin, heparin started. Awaiting CTA to rule out PE. Discussed with on call Cardiologist who would like for  CT to be done before he consults.   8:48 PM CT negative for PE. Dr. Terressa Koyanagi to evaluate in the ED.   CRITICAL CARE Performed by: Pollyann Savoy   Total critical care time: 40  Critical care time was exclusive of separately billable procedures and treating other patients.  Critical care was necessary to treat or prevent imminent or life-threatening deterioration.  Critical care was time spent personally by me on the following activities: development of treatment plan with patient and/or surrogate as well as nursing, discussions with consultants, evaluation of patient's response to treatment, examination of patient, obtaining history from patient or surrogate, ordering and performing treatments and interventions, ordering and review of laboratory studies, ordering and review of radiographic studies, pulse oximetry and re-evaluation of patient's condition.    Charles B. Bernette Mayers, MD 07/26/12 2048

## 2012-07-27 DIAGNOSIS — I2510 Atherosclerotic heart disease of native coronary artery without angina pectoris: Secondary | ICD-10-CM

## 2012-07-27 DIAGNOSIS — I214 Non-ST elevation (NSTEMI) myocardial infarction: Secondary | ICD-10-CM

## 2012-07-27 LAB — HEPARIN LEVEL (UNFRACTIONATED)
Heparin Unfractionated: <0.1 IU/mL — ABNORMAL LOW (ref 0.30–0.70)
Heparin Unfractionated: 0.2 IU/mL — ABNORMAL LOW (ref 0.30–0.70)

## 2012-07-27 LAB — LIPID PANEL
HDL: 37 mg/dL — ABNORMAL LOW (ref >39)
LDL Cholesterol: 123 mg/dL — ABNORMAL HIGH (ref 0–99)
Triglycerides: 263 mg/dL — ABNORMAL HIGH (ref <150)
VLDL: 53 mg/dL — ABNORMAL HIGH (ref 0–40)

## 2012-07-27 LAB — PROTIME-INR: Prothrombin Time: 13.9 seconds (ref 11.6–15.2)

## 2012-07-27 LAB — BASIC METABOLIC PANEL
BUN: 12 mg/dL (ref 6–23)
Calcium: 8.2 mg/dL — ABNORMAL LOW (ref 8.4–10.5)
Calcium: 8.5 mg/dL (ref 8.4–10.5)
Chloride: 104 mEq/L (ref 96–112)
Creatinine, Ser: 0.69 mg/dL (ref 0.50–1.10)
GFR calc Af Amer: >90 mL/min (ref >90)
GFR calc non Af Amer: 87 mL/min — ABNORMAL LOW (ref >90)
GFR calc non Af Amer: 87 mL/min — ABNORMAL LOW (ref >90)
Glucose, Bld: 104 mg/dL — ABNORMAL HIGH (ref 70–99)

## 2012-07-27 LAB — CBC
MCV: 86.3 fL (ref 78.0–100.0)
Platelets: 233 10*3/uL (ref 150–400)
RBC: 4.68 MIL/uL (ref 3.87–5.11)
WBC: 6.4 10*3/uL (ref 4.0–10.5)

## 2012-07-27 LAB — MRSA PCR SCREENING: MRSA by PCR: NEGATIVE

## 2012-07-27 LAB — TROPONIN I
Troponin I: 13.3 ng/mL (ref <0.30)
Troponin I: 5 ng/mL (ref <0.30)

## 2012-07-27 LAB — MAGNESIUM: Magnesium: 1.9 mg/dL (ref 1.5–2.5)

## 2012-07-27 MED ORDER — SODIUM CHLORIDE 0.9 % IJ SOLN: 3.0000 mL | INTRAMUSCULAR | Status: DC | PRN

## 2012-07-27 MED ORDER — ACETAMINOPHEN 325 MG PO TABS: 650.0000 mg | ORAL_TABLET | ORAL | Status: DC | PRN

## 2012-07-27 MED ORDER — TRIMETHOPRIM 100 MG PO TABS
100.0000 mg | ORAL_TABLET | Freq: Every day | ORAL | Status: DC
  Administered 2012-07-27 – 2012-07-30 (×4): 100 mg via ORAL
  Filled 2012-07-27 (×4): qty 1

## 2012-07-27 MED ORDER — METHYLPREDNISOLONE 4 MG PO TABS
4.0000 mg | ORAL_TABLET | Freq: Three times a day (TID) | ORAL | Status: DC
Start: 2012-07-27 — End: 2012-07-27

## 2012-07-27 MED ORDER — METOPROLOL TARTRATE 12.5 MG HALF TABLET
12.5000 mg | ORAL_TABLET | Freq: Two times a day (BID) | ORAL | Status: DC
  Administered 2012-07-27 – 2012-07-30 (×6): 12.5 mg via ORAL
  Filled 2012-07-27 (×10): qty 1

## 2012-07-27 MED ORDER — METHYLPREDNISOLONE 4 MG PO TABS
4.0000 mg | ORAL_TABLET | Freq: Three times a day (TID) | ORAL | Status: DC
  Administered 2012-07-27 – 2012-07-30 (×10): 4 mg via ORAL
  Filled 2012-07-27 (×14): qty 1

## 2012-07-27 MED ORDER — SODIUM CHLORIDE 0.9 % IV SOLN
INTRAVENOUS | Status: DC
  Administered 2012-07-27: 13:00:00 via INTRAVENOUS

## 2012-07-27 MED ORDER — SODIUM CHLORIDE 0.9 % IV SOLN: 250.0000 mL | INTRAVENOUS | Status: DC | PRN

## 2012-07-27 MED ORDER — POTASSIUM CHLORIDE CRYS ER 20 MEQ PO TBCR
EXTENDED_RELEASE_TABLET | ORAL | Status: AC
  Administered 2012-07-27: 13:00:00
  Filled 2012-07-27: qty 2

## 2012-07-27 MED ORDER — SODIUM CHLORIDE 0.9 % IJ SOLN: 3.0000 mL | Freq: Two times a day (BID) | INTRAMUSCULAR | Status: DC

## 2012-07-27 MED ORDER — ASPIRIN 81 MG PO CHEW
324.0000 mg | CHEWABLE_TABLET | ORAL | Status: AC
  Administered 2012-07-28: 324 mg via ORAL
  Filled 2012-07-27: qty 4

## 2012-07-27 MED ORDER — PERFLUTREN LIPID MICROSPHERE
INTRAVENOUS | Status: AC
  Administered 2012-07-27: 5 mL
  Filled 2012-07-27: qty 10

## 2012-07-27 MED ORDER — ASPIRIN EC 81 MG PO TBEC
81.0000 mg | DELAYED_RELEASE_TABLET | Freq: Every day | ORAL | Status: DC
  Administered 2012-07-27 – 2012-07-30 (×3): 81 mg via ORAL
  Filled 2012-07-27 (×4): qty 1

## 2012-07-27 MED ORDER — ASPIRIN 81 MG PO TABS: 81.0000 mg | ORAL_TABLET | Freq: Every day | ORAL | Status: DC

## 2012-07-27 MED ORDER — HEPARIN BOLUS VIA INFUSION
3000.0000 [IU] | Freq: Once | INTRAVENOUS | Status: AC
Start: 2012-07-27 — End: 2012-07-27
  Administered 2012-07-27: 3000 [IU] via INTRAVENOUS
  Filled 2012-07-27: qty 3000

## 2012-07-27 MED ORDER — POTASSIUM CHLORIDE 20 MEQ/15ML (10%) PO LIQD
20.0000 meq | Freq: Two times a day (BID) | ORAL | Status: DC
  Filled 2012-07-27 (×2): qty 15

## 2012-07-27 MED ORDER — DIAZEPAM 5 MG PO TABS
5.0000 mg | ORAL_TABLET | ORAL | Status: AC
  Administered 2012-07-28: 5 mg via ORAL
  Filled 2012-07-27: qty 1

## 2012-07-27 MED ORDER — HEPARIN BOLUS VIA INFUSION
2000.0000 [IU] | Freq: Once | INTRAVENOUS | Status: AC
  Administered 2012-07-27: 2000 [IU] via INTRAVENOUS
  Filled 2012-07-27: qty 2000

## 2012-07-27 MED ORDER — HEPARIN BOLUS VIA INFUSION
3000.0000 [IU] | Freq: Once | INTRAVENOUS | Status: AC
  Administered 2012-07-27: 3000 [IU] via INTRAVENOUS
  Filled 2012-07-27: qty 3000

## 2012-07-27 MED ORDER — ONDANSETRON HCL 4 MG/2ML IJ SOLN: 4.0000 mg | Freq: Four times a day (QID) | INTRAMUSCULAR | Status: DC | PRN

## 2012-07-27 MED ORDER — PANTOPRAZOLE SODIUM 40 MG PO TBEC
40.0000 mg | DELAYED_RELEASE_TABLET | Freq: Two times a day (BID) | ORAL | Status: DC
  Administered 2012-07-27 – 2012-07-30 (×6): 40 mg via ORAL
  Filled 2012-07-27 (×6): qty 1

## 2012-07-27 MED ORDER — POTASSIUM CHLORIDE 20 MEQ/15ML (10%) PO LIQD
40.0000 meq | Freq: Once | ORAL | Status: DC
  Filled 2012-07-27: qty 30

## 2012-07-27 MED ORDER — ATORVASTATIN CALCIUM 80 MG PO TABS
80.0000 mg | ORAL_TABLET | Freq: Every day | ORAL | Status: DC
  Administered 2012-07-27 – 2012-07-29 (×3): 80 mg via ORAL
  Filled 2012-07-27 (×4): qty 1

## 2012-07-27 MED ORDER — SODIUM CHLORIDE 0.9 % IJ SOLN
3.0000 mL | Freq: Two times a day (BID) | INTRAMUSCULAR | Status: DC
  Administered 2012-07-27 (×2): 3 mL via INTRAVENOUS
  Administered 2012-07-29: 10:00:00 10 mL via INTRAVENOUS
  Administered 2012-07-29: 3 mL via INTRAVENOUS

## 2012-07-27 NOTE — Progress Notes (Signed)
ANTICOAGULATION CONSULT NOTE - Follow Up Consult  Pharmacy Consult for Heparin Indication: chest pain/ACS  Allergies  Allergen Reactions  . Levofloxacin Swelling  . Nitrofurantoin     *MACROBID*  Tongue swelling    Patient Measurements: Height: 5' 4.5" (163.8 cm) Weight: 153 lb 3.5 oz (69.5 kg) IBW/kg (Calculated) : 55.85  Heparin Dosing Weight: 68 Kg  Vital Signs: Temp: 97.7 F (36.5 C) (12/15 0000) Temp src: Oral (12/15 0000) BP: 129/57 mmHg (12/15 0000) Pulse Rate: 67  (12/15 0100)  Labs:  Basename 07/27/12 0050 07/26/12 2102 07/26/12 1800  HGB -- -- 12.7  HCT -- -- 36.9  PLT -- -- 246  APTT -- -- 28  LABPROT -- -- 13.4  INR -- -- 1.03  HEPARINUNFRC <0.10* -- --  CREATININE -- -- 0.82  CKTOTAL -- -- --  CKMB -- -- --  TROPONINI -- 7.02* 0.71*    Estimated Creatinine Clearance: 62.7 ml/min (by C-G formula based on Cr of 0.82).   Medical History: Past Medical History  Diagnosis Date  . Pulmonary embolism   . Rheumatoid arthritis   . Hypertension   . Incontinence of feces   . Unspecified disorder of bladder   . Rectal prolapse   . Hiatal hernia   . IBS (irritable bowel syndrome)   . GERD (gastroesophageal reflux disease)   . Pain in limb   . Lumbago     hospital admission with back pain in April, 2011 repair of a disc herniation L4-L5   . Osteoarthrosis, unspecified whether generalized or localized, unspecified site   . COPD (chronic obstructive pulmonary disease)   . Tobacco use disorder   . CAD (coronary artery disease)     Nuclear, May, 2010, no ischemia / catheterization 2012, nonobstructive CAD ( 40% circumflex, 40% LAD) EF normal  . Carotid artery disease     Doppler, July, 2010, 40-59% bilateral, stable, followup one year  . Adenomatous polyp 02/2008  . AVM (arteriovenous malformation)     Small colonic  . Ejection fraction     EF 55-60%, echo, April, 2011, grade 1 diastolic dysfunction.  . Shortness of breath     Cardiac evaluation,  Dr.Katz, pulmonary evaluation, 2012, Dr. Delton Coombes.  Marland Kitchen UTI (lower urinary tract infection)     Possible UTI by history June 14, 2011,   . Gout   . Shoulder pain, right   . Spinal stenosis   . Hyperlipidemia   . Glaucoma   . PE (pulmonary embolism)   . Splenomegaly   . Fatty infiltration of liver   . Cystitis     Medications:  Vit C, asa, Vit D, B12, lasix, medrol, pantoprazole, ntg, KCL, zantac, trimethoprim  Assessment: [name] is admitted with chest pain on IV heparin. Heparin level (<0.1) is below-goal on 950 units/hr. No problem with line / infusion per RN.   Goal of Therapy:  Heparin level 0.3-0.7 units/ml Monitor platelets by anticoagulation protocol: Yes   Plan:  1. Heparin IV bolus of 2000 units x 1, then increase IV infusion to 1250 units/hr.  2. Heparin level in 6 hours.   Lorre Munroe, PharmD 07/27/2012 2:26 AM

## 2012-07-27 NOTE — Progress Notes (Signed)
Utilization Review Completed.[name] T12/15/2013   

## 2012-07-27 NOTE — Progress Notes (Addendum)
Subjective:  Patient was admitted with chest pain. Has NSTEMI by EKG and enzymes. No further pain. On IV NTG and IV heparin. Potassium low. No urine output but has had problems with leakage of bladder in past. Has history of esophageal stricture and difficulty swallowing large pills. Objective:  Vital Signs in the last 24 hours: Temp:  [97 F (36.1 C)-98.2 F (36.8 C)] 98.2 F (36.8 C) (12/15 0800) Pulse Rate:  [65-83] 69  (12/15 0900) Resp:  [14-23] 20  (12/15 0900) BP: (97-129)/(48-68) 121/63 mmHg (12/15 0900) SpO2:  [97 %-100 %] 97 % (12/15 0800) Weight:  [149 lb (67.586 kg)-153 lb 3.5 oz (69.5 kg)] 153 lb 3.5 oz (69.5 kg) (12/15 0000)  Intake/Output from previous day: 12/14 0701 - 12/15 0700 In: 589 [I.V.:589] Out: 0  Intake/Output from this shift: Total I/O In: 42 [I.V.:42] Out: 0      . aspirin EC  81 mg Oral Daily  . atorvastatin  80 mg Oral q1800  . methylPREDNISolone  4 mg Oral TID WC  . metoprolol tartrate  12.5 mg Oral BID  . pantoprazole  40 mg Oral BID  . sodium chloride  3 mL Intravenous Q12H  . trimethoprim  100 mg Oral Daily      . sodium chloride    . heparin 1,250 Units/hr (07/27/12 0234)  . nitroGLYCERIN 5 mcg/min (07/27/12 0127)    Physical Exam: The patient appears to be in no distress.  Head and neck exam reveals that the pupils are equal and reactive.  The extraocular movements are full.  There is no scleral icterus.  Mouth and pharynx are benign.  No lymphadenopathy.  No carotid bruits.  The jugular venous pressure is normal.  Thyroid is not enlarged or tender.  Chest is clear to percussion and auscultation.  No rales or rhonchi.  Expansion of the chest is symmetrical.  Heart reveals no abnormal lift or heave.  First and second heart sounds are normal.  There is no murmur gallop rub or click.  The abdomen is soft and nontender.  Bowel sounds are normoactive.  There is no hepatosplenomegaly or mass.  There are no abdominal  bruits.  Extremities reveal no phlebitis or edema.  Pedal pulses are good.  There is no cyanosis or clubbing.  Neurologic exam is normal strength and no lateralizing weakness.  No sensory deficits.  Integument reveals no rash  Lab Results:  Basename 07/27/12 0932 07/26/12 1800  WBC 6.4 10.2  HGB 13.6 12.7  PLT 233 246    Basename 07/26/12 1800  NA 139  K 3.1*  CL 104  CO2 24  GLUCOSE 106*  BUN 16  CREATININE 0.82    Basename 07/27/12 0155 07/26/12 2102  TROPONINI 13.30* 7.02*   Hepatic Function Panel No results found for this basename: PROT,ALBUMIN,AST,ALT,ALKPHOS,BILITOT,BILIDIR,IBILI in the last 72 hours No results found for this basename: CHOL in the last 72 hours No results found for this basename: PROTIME in the last 72 hours  Imaging: Ct Head Wo Contrast  07/26/2012  *RADIOLOGY REPORT*  Clinical Data: Slurred speech, hypertension, history smoking, coronary artery disease  CT HEAD WITHOUT CONTRAST  Technique:  Contiguous axial images were obtained from the base of the skull through the vertex without contrast.  Comparison: None.  Findings: Normal ventricular morphology. No midline shift or mass effect. Normal appearance of brain parenchyma. No intracranial hemorrhage, mass lesion, or acute infarction. Visualized paranasal sinuses and mastoid air cells clear. Bones unremarkable.  IMPRESSION: No acute intracranial  abnormalities.   Original Report Authenticated By: Ulyses Southward, M.D.    Ct Angio Chest Pe W/cm &/or Wo Cm  07/26/2012  *RADIOLOGY REPORT*  Clinical Data: Sudden onset chest pain 2 hours ago, nausea, vomiting, increased pain with movement, past history of cholecystectomy, gout, pulmonary embolism  CT ANGIOGRAPHY CHEST  Technique:  Multidetector CT imaging of the chest using the standard protocol during bolus administration of intravenous contrast. Multiplanar reconstructed images including MIPs were obtained and reviewed to evaluate the vascular anatomy.  Contrast:   80 ml Omnipaque-350 IV  Comparison: CT chest 01/03/2012  Findings: Scattered atherosclerotic calcifications aorta and coronary arteries. No aortic aneurysm or dissection. Scattered normal-sized mediastinal lymph nodes. Spleen appears enlarged, 12.5 x 8.9 cm image 89, length not fully imaged. Pulmonary arteries patent. No evidence pulmonary embolism. Minimal dependent atelectasis in both lungs. No acute infiltrate, pleural effusion or pneumothorax. Osseous structures unremarkable.  IMPRESSION: No evidence pulmonary embolism. Dependent atelectasis in both lungs. Scattered atherosclerotic disease changes of the aorta and coronary arteries. Question splenomegaly, incompletely imaged.   Original Report Authenticated By: Ulyses Southward, M.D.    Dg Chest Port 1 View  07/26/2012  *RADIOLOGY REPORT*  Clinical Data: Short of breath.  Cough.  Chest pain.  PORTABLE CHEST - 1 VIEW  Comparison: 06/03/2011  Findings: Chronic interstitial prominence in the right lung base is stable.  No evidence of acute infiltrate or pleural effusion. Heart size is stable and within normal limits.  IMPRESSION: Stable chronic interstitial prominence in right lung base.  No acute findings.   Original Report Authenticated By: Myles Rosenthal, M.D.     Cardiac Studies: 2D echo pending. EKG today shows evolving inferolateral T wave inversion. Assessment/Plan:  Patient Active Hospital Problem List: 1. NSTEMI 2. Hypertension. 3. Rheumatoid arthritis on chronic prednisone 4. Urinary bladder dysfunction. 5. Hyperlipidemia not on statin 6. Prior tobacco abuse quit 6 years ago 7. Hypokalemia. 8. Esophageal stricture. ? Difficulty swallowing.  Plan: Add statin. Add BB          Replete K+          Continue IV heparin, IV NTG.          Bedside swallowing study          Left heart cath Monday. Orders written.   LOS: 1 day    [name] 07/27/2012, 10:24 AM

## 2012-07-27 NOTE — Progress Notes (Signed)
  Echocardiogram 2D Echocardiogram with Definity has been performed.  Lakenzie Mcclafferty FRANCES 07/27/2012, 5:07 PM

## 2012-07-27 NOTE — Progress Notes (Signed)
ANTICOAGULATION CONSULT NOTE - Follow Up Consult  Pharmacy Consult for Heparin Indication: chest pain/ACS  Allergies  Allergen Reactions  . Levofloxacin Swelling  . Nitrofurantoin     *MACROBID*  Tongue swelling    Patient Measurements: Height: 5' 4.5" (163.8 cm) Weight: 153 lb 3.5 oz (69.5 kg) IBW/kg (Calculated) : 55.85  Heparin Dosing Weight: 68 Kg  Vital Signs: Temp: 98 F (36.7 C) (12/15 1200) Temp src: Oral (12/15 1200) BP: 137/39 mmHg (12/15 1800) Pulse Rate: 84  (12/15 1800)  Labs:  Basename 07/27/12 2038 07/27/12 1400 07/27/12 0933 07/27/12 0932 07/27/12 0155 07/27/12 0050 07/26/12 1800  HGB -- -- -- 13.6 -- -- 12.7  HCT -- -- -- 40.4 -- -- 36.9  PLT -- -- -- 233 -- -- 246  APTT -- -- -- -- -- -- 28  LABPROT -- -- -- 13.9 -- -- 13.4  INR -- -- -- 1.08 -- -- 1.03  HEPARINUNFRC 0.20* -- 0.10* -- -- <0.10* --  CREATININE -- 0.69 -- 0.69 -- -- 0.82  CKTOTAL -- -- -- -- -- -- --  CKMB -- -- -- -- -- -- --  TROPONINI -- 3.39* -- 5.00* 13.30* -- --    Estimated Creatinine Clearance: 64.2 ml/min (by C-G formula based on Cr of 0.69).   Medical History: Past Medical History  Diagnosis Date  . Pulmonary embolism   . Rheumatoid arthritis   . Hypertension   . Incontinence of feces   . Unspecified disorder of bladder   . Rectal prolapse   . Hiatal hernia   . IBS (irritable bowel syndrome)   . GERD (gastroesophageal reflux disease)   . Pain in limb   . Lumbago     hospital admission with back pain in April, 2011 repair of a disc herniation L4-L5   . Osteoarthrosis, unspecified whether generalized or localized, unspecified site   . COPD (chronic obstructive pulmonary disease)   . Tobacco use disorder   . CAD (coronary artery disease)     Nuclear, May, 2010, no ischemia / catheterization 2012, nonobstructive CAD ( 40% circumflex, 40% LAD) EF normal  . Carotid artery disease     Doppler, July, 2010, 40-59% bilateral, stable, followup one year  . 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 AVM (arteriovenous malformation)     Small colonic  . Ejection fraction     EF 55-60%, echo, April, 2011, grade 1 diastolic dysfunction.  . Shortness of breath     Cardiac evaluation, Dr.Katz, pulmonary evaluation, 2012, Dr. Delton Coombes.  Marland Kitchen UTI (lower urinary tract infection)     Possible UTI by history June 14, 2011,   . Gout   . Shoulder pain, right   . Spinal stenosis   . Hyperlipidemia   . Glaucoma   . PE (pulmonary embolism)   . Splenomegaly   . Fatty infiltration of liver   . Cystitis        . sodium chloride 75 mL/hr at 07/27/12 1244  . heparin 1,500 Units/hr (07/27/12 1245)  . nitroGLYCERIN 5 mcg/min (07/27/12 0127)   Assessment: 71 year old female continuing on heparin for chest pain. Heparin level remains subtherapeutic. Noted plans for cath in AM. No bleeding reported.  Goal of Therapy:  Heparin level 0.3-0.7 units/ml Monitor platelets by anticoagulation protocol: Yes   Plan:  1. Heparin IV bolus of 3000 units x 1, then increase IV infusion to 1650 units/hr.  2. Heparin level in 8 hours.   Thanks, Almer Bushey K. Allena Katz, PharmD, BCPS.  Clinical  Pharmacist Pager [phone]. 07/27/2012 9:59 PM

## 2012-07-27 NOTE — Progress Notes (Signed)
ANTICOAGULATION CONSULT NOTE - Follow Up Consult  Pharmacy Consult for Heparin Indication: chest pain/ACS  Allergies  Allergen Reactions  . Levofloxacin Swelling  . Nitrofurantoin     *MACROBID*  Tongue swelling    Patient Measurements: Height: 5' 4.5" (163.8 cm) Weight: 153 lb 3.5 oz (69.5 kg) IBW/kg (Calculated) : 55.85  Heparin Dosing Weight: 68 Kg  Vital Signs: Temp: 98.2 F (36.8 C) (12/15 0800) Temp src: Oral (12/15 0800) BP: 121/63 mmHg (12/15 0900) Pulse Rate: 69  (12/15 0900)  Labs:  Basename 07/27/12 0933 07/27/12 0932 07/27/12 0155 07/27/12 0050 07/26/12 2102 07/26/12 1800  HGB -- 13.6 -- -- -- 12.7  HCT -- 40.4 -- -- -- 36.9  PLT -- 233 -- -- -- 246  APTT -- -- -- -- -- 28  LABPROT -- 13.9 -- -- -- 13.4  INR -- 1.08 -- -- -- 1.03  HEPARINUNFRC 0.10* -- -- <0.10* -- --  CREATININE -- 0.69 -- -- -- 0.82  CKTOTAL -- -- -- -- -- --  CKMB -- -- -- -- -- --  TROPONINI -- 5.00* 13.30* -- 7.02* --    Estimated Creatinine Clearance: 64.2 ml/min (by C-G formula based on Cr of 0.69).   Medical History: Past Medical History  Diagnosis Date  . Pulmonary embolism   . Rheumatoid arthritis   . Hypertension   . Incontinence of feces   . Unspecified disorder of bladder   . Rectal prolapse   . Hiatal hernia   . IBS (irritable bowel syndrome)   . GERD (gastroesophageal reflux disease)   . Pain in limb   . Lumbago     hospital admission with back pain in April, 2011 repair of a disc herniation L4-L5   . Osteoarthrosis, unspecified whether generalized or localized, unspecified site   . COPD (chronic obstructive pulmonary disease)   . Tobacco use disorder   . CAD (coronary artery disease)     Nuclear, May, 2010, no ischemia / catheterization 2012, nonobstructive CAD ( 40% circumflex, 40% LAD) EF normal  . Carotid artery disease     Doppler, July, 2010, 40-59% bilateral, stable, followup one year  . Adenomatous polyp 02/2008  . AVM (arteriovenous malformation)      Small colonic  . Ejection fraction     EF 55-60%, echo, April, 2011, grade 1 diastolic dysfunction.  . Shortness of breath     Cardiac evaluation, Dr.Katz, pulmonary evaluation, 2012, Dr. Delton Coombes.  Marland Kitchen UTI (lower urinary tract infection)     Possible UTI by history June 14, 2011,   . Gout   . Shoulder pain, right   . Spinal stenosis   . Hyperlipidemia   . Glaucoma   . PE (pulmonary embolism)   . Splenomegaly   . Fatty infiltration of liver   . Cystitis        . sodium chloride    . heparin 1,250 Units/hr (07/27/12 0234)  . nitroGLYCERIN 5 mcg/min (07/27/12 0127)     Assessment: 71 year old female continuing on heparin for chest pain. Heparin level remains subtherapeutic.  Goal of Therapy:  Heparin level 0.3-0.7 units/ml Monitor platelets by anticoagulation protocol: Yes   Plan:  1. Heparin IV bolus of 3000 units x 1, then increase IV infusion to 1500 units/hr.  2. Heparin level in 8 hours.   Estella Husk, Pharm.D., BCPS Clinical Pharmacist  Phone [phone] Pager [phone] 07/27/2012, 12:02 PM

## 2012-07-28 ENCOUNTER — Encounter (HOSPITAL_COMMUNITY)
Admission: EM | Disposition: A | Discharge status: Home / Self Care | Payer: Self-pay | Source: Home / Self Care | Attending: Internal Medicine

## 2012-07-28 DIAGNOSIS — I2510 Atherosclerotic heart disease of native coronary artery without angina pectoris: Secondary | ICD-10-CM

## 2012-07-28 HISTORY — PX: LEFT HEART CATHETERIZATION WITH CORONARY ANGIOGRAM: SHX5451

## 2012-07-28 HISTORY — PX: CARDIAC CATHETERIZATION: SHX172

## 2012-07-28 LAB — HEPARIN LEVEL (UNFRACTIONATED): Heparin Unfractionated: 0.39 IU/mL (ref 0.30–0.70)

## 2012-07-28 LAB — CBC
Hemoglobin: 12.6 g/dL (ref 12.0–15.0)
MCH: 29.6 pg (ref 26.0–34.0)
MCHC: 33.8 g/dL (ref 30.0–36.0)
RDW: 15.6 % — ABNORMAL HIGH (ref 11.5–15.5)

## 2012-07-28 SURGERY — LEFT HEART CATHETERIZATION WITH CORONARY ANGIOGRAM
Anesthesia: LOCAL

## 2012-07-28 MED ORDER — POTASSIUM CHLORIDE CRYS ER 20 MEQ PO TBCR
20.0000 meq | EXTENDED_RELEASE_TABLET | Freq: Two times a day (BID) | ORAL | Status: DC
  Administered 2012-07-28 – 2012-07-30 (×4): 20 meq via ORAL
  Filled 2012-07-28 (×4): qty 1

## 2012-07-28 MED ORDER — HEPARIN SODIUM (PORCINE) 1000 UNIT/ML IJ SOLN
INTRAMUSCULAR | Status: AC
  Filled 2012-07-28: qty 1

## 2012-07-28 MED ORDER — MIDAZOLAM HCL 2 MG/2ML IJ SOLN
INTRAMUSCULAR | Status: AC
  Filled 2012-07-28: qty 2

## 2012-07-28 MED ORDER — SODIUM CHLORIDE 0.9 % IV SOLN
1.0000 mL/kg/h | INTRAVENOUS | Status: DC
  Administered 2012-07-28: 1 mL/kg/h via INTRAVENOUS

## 2012-07-28 MED ORDER — HEPARIN (PORCINE) IN NACL 2-0.9 UNIT/ML-% IJ SOLN
INTRAMUSCULAR | Status: AC
  Filled 2012-07-28: qty 1000

## 2012-07-28 MED ORDER — LIDOCAINE HCL (PF) 1 % IJ SOLN
INTRAMUSCULAR | Status: AC
  Filled 2012-07-28: qty 30

## 2012-07-28 MED ORDER — SODIUM CHLORIDE 0.9 % IJ SOLN: 3.0000 mL | INTRAMUSCULAR | Status: DC | PRN

## 2012-07-28 MED ORDER — FENTANYL CITRATE 0.05 MG/ML IJ SOLN
INTRAMUSCULAR | Status: AC
  Filled 2012-07-28: qty 2

## 2012-07-28 MED ORDER — NITROGLYCERIN 0.2 MG/ML ON CALL CATH LAB
INTRAVENOUS | Status: AC
  Filled 2012-07-28: qty 1

## 2012-07-28 MED ORDER — OXYCODONE-ACETAMINOPHEN 5-325 MG PO TABS
1.0000 | ORAL_TABLET | ORAL | Status: DC | PRN
  Administered 2012-07-28 – 2012-07-29 (×2): 1 via ORAL
  Filled 2012-07-28 (×2): qty 1

## 2012-07-28 MED ORDER — SODIUM CHLORIDE 0.9 % IJ SOLN: 3.0000 mL | Freq: Two times a day (BID) | INTRAMUSCULAR | Status: DC

## 2012-07-28 MED ORDER — VERAPAMIL HCL 2.5 MG/ML IV SOLN
INTRAVENOUS | Status: AC
  Filled 2012-07-28: qty 2

## 2012-07-28 MED ORDER — ACETAMINOPHEN 325 MG PO TABS: 650.0000 mg | ORAL_TABLET | ORAL | Status: DC | PRN

## 2012-07-28 MED ORDER — ONDANSETRON HCL 4 MG/2ML IJ SOLN: 4.0000 mg | Freq: Four times a day (QID) | INTRAMUSCULAR | Status: DC | PRN

## 2012-07-28 MED ORDER — SODIUM CHLORIDE 0.9 % IV SOLN: 250.0000 mL | INTRAVENOUS | Status: DC

## 2012-07-28 MED ORDER — ENSURE PUDDING PO PUDG
1.0000 | Freq: Two times a day (BID) | ORAL | Status: DC
  Filled 2012-07-28 (×4): qty 1

## 2012-07-28 NOTE — Progress Notes (Signed)
Patient ID: [name], female   DOB: Jan 30, 1941, 71 y.o.   MRN: [id number]   The patient is for cath today. Orders were previously written and the patient is on the cath board. See the complete note by Dr. Patty Sermons yesterday. Potassium has been treated. He adjusted her meds. 2-D echo was done yesterday. This shows that ejection fraction is now 40-45%. There is a new wall motion abnormality since the echo of April, 2013. The EF at that time was 60%. A swallowing study was also ordered.  The catheterization data today will be very important in making the next steps in her care. Also await the result of the swallowing analysis.  Jerral Bonito

## 2012-07-28 NOTE — CV Procedure (Signed)
   Cardiac Catheterization Procedure Note  Name: [name] MRN: [id number] DOB: [birth year]  Procedure: Left Heart Cath, Selective Coronary Angiography, LV angiography  Indication: NSTEMI   Procedural Details: The right wrist was prepped, draped, and anesthetized with 1% lidocaine. Using the modified Seldinger technique, a 5 French sheath was introduced into the right radial artery. 3 mg of verapamil was administered through the sheath, weight-based unfractionated heparin was administered intravenously. Standard Judkins catheters were used for selective coronary angiography and left ventriculography. Catheter exchanges were performed over an exchange length guidewire. There were no immediate procedural complications. A TR band was used for radial hemostasis at the completion of the procedure.  The patient was transferred to the post catheterization recovery area for further monitoring.  Procedural Findings: Hemodynamics: AO 146/73 LV 147/22  Coronary angiography: Coronary dominance: right  Left mainstem: Separate ostia for the LAD and LCx  Left anterior descending (LAD): Patent to the LV apex, but does not wrap around the apex. Mild nonobstructive plaque in the proximal and mid vessel. Diagonals are patent. Mid LAD with 40-50% stenosis  Left circumflex (LCx): Mild calcification. 40-50% proximal stenosis. OM branches patent.  Right coronary artery (RCA): Very large, dominant vessel. PDA and PLA branches are widely patent. No obstructive disease throughout.  Left ventriculography: Left ventricular systolic function is moderately depressed. The periapical region is akinetic. Basal segments are hyperdynamic. LVEF estimated at 40%  Final Conclusions:   1. Nonobstructive CAD 2. 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 Beta-blocker, med Rx, follow-up echo in 6-8 weeks.  Tonny Bollman 07/28/2012, 4:05 PM

## 2012-07-28 NOTE — Progress Notes (Signed)
ANTICOAGULATION CONSULT NOTE - Follow Up Consult  Pharmacy Consult for Heparin Indication: chest pain/ACS  Allergies  Allergen Reactions  . Levofloxacin Swelling  . Nitrofurantoin     *MACROBID*  Tongue swelling    Patient Measurements: Height: 5' 4.5" (163.8 cm) Weight: 156 lb 12 oz (71.1 kg) IBW/kg (Calculated) : 55.85  Heparin Dosing Weight: 68 Kg  Vital Signs: Temp: 98.9 F (37.2 C) (12/16 0800) Temp src: Oral (12/16 0800) BP: 133/64 mmHg (12/16 0900) Pulse Rate: 69  (12/16 0900)  Labs:  Basename 07/28/12 0535 07/27/12 2038 07/27/12 1400 07/27/12 0933 07/27/12 0932 07/27/12 0155 07/26/12 1800  HGB 12.6 -- -- -- 13.6 -- --  HCT 37.3 -- -- -- 40.4 -- 36.9  PLT 227 -- -- -- 233 -- 246  APTT -- -- -- -- -- -- 28  LABPROT -- -- -- -- 13.9 -- 13.4  INR -- -- -- -- 1.08 -- 1.03  HEPARINUNFRC 0.39 0.20* -- 0.10* -- -- --  CREATININE -- -- 0.69 -- 0.69 -- 0.82  CKTOTAL -- -- -- -- -- -- --  CKMB -- -- -- -- -- -- --  TROPONINI -- -- 3.39* -- 5.00* 13.30* --    Estimated Creatinine Clearance: 65 ml/min (by C-G formula based on Cr of 0.69).   Medical History: Past Medical History  Diagnosis Date  . Pulmonary embolism   . Rheumatoid arthritis   . Hypertension   . Incontinence of feces   . Unspecified disorder of bladder   . Rectal prolapse   . Hiatal hernia   . IBS (irritable bowel syndrome)   . GERD (gastroesophageal reflux disease)   . Pain in limb   . Lumbago     hospital admission with back pain in April, 2011 repair of a disc herniation L4-L5   . Osteoarthrosis, unspecified whether generalized or localized, unspecified site   . COPD (chronic obstructive pulmonary disease)   . Tobacco use disorder   . CAD (coronary artery disease)     Nuclear, May, 2010, no ischemia / catheterization 2012, nonobstructive CAD ( 40% circumflex, 40% LAD) EF normal  . Carotid artery disease     Doppler, July, 2010, 40-59% bilateral, stable, followup one year  . 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 AVM (arteriovenous malformation)     Small colonic  . Ejection fraction     EF 55-60%, echo, April, 2011, grade 1 diastolic dysfunction.  . Shortness of breath     Cardiac evaluation, Dr.Katz, pulmonary evaluation, 2012, Dr. Delton Coombes.  Marland Kitchen UTI (lower urinary tract infection)     Possible UTI by history June 14, 2011,   . Gout   . Shoulder pain, right   . Spinal stenosis   . Hyperlipidemia   . Glaucoma   . PE (pulmonary embolism)   . Splenomegaly   . Fatty infiltration of liver   . Cystitis        . sodium chloride 75 mL/hr at 07/27/12 1244  . heparin 1,650 Units/hr (07/27/12 2202)  . nitroGLYCERIN 5 mcg/min (07/27/12 0127)   Assessment: 71 year old female continuing on heparin for chest pain. Heparin level now therapeutic.  Awaiting cath today. No bleeding reported.  Goal of Therapy:  Heparin level 0.3-0.7 units/ml Monitor platelets by anticoagulation protocol: Yes   Plan:  1. Continue heparin IV infusion at 1650 units/hr.  2. F/U plans for anticoagulation after cath today.  Reece Leader, Pharm D 07/28/2012 2:09 PM

## 2012-07-28 NOTE — Progress Notes (Signed)
TR BAND REMOVAL  LOCATION:    right radial  DEFLATED PER PROTOCOL:    yes  TIME BAND OFF / DRESSING APPLIED:    1945   SITE UPON ARRIVAL:    Level 0  SITE AFTER BAND REMOVAL:    Level 0  REVERSE ALLEN'S TEST:     positive  CIRCULATION SENSATION AND MOVEMENT:    Within Normal Limits   yes  COMMENTS:   Band removed without complications

## 2012-07-28 NOTE — Progress Notes (Signed)
Speech Language Pathology  Patient Details Name: [name] MRN: [id number] DOB: 09/13/1940 Today's Date: 07/28/2012 Time:  -    Pt. Currently in cath lab.  Plan for swallow assessment tomorrow morning.  Breck Coons [place].Ed [employer] [phone]  07/28/2012

## 2012-07-28 NOTE — Progress Notes (Signed)
Speech Language Pathology  Patient Details Name: [name] MRN: [id number] DOB: 05/04/1941 Today's Date: 07/28/2012 Time:  -    Received order for swallow assessment.  Pt. Is NPO for procedure in the cath lab.  Will plan on returning later today.  Breck Coons [place].Ed [employer] [phone]  07/28/2012

## 2012-07-28 NOTE — Progress Notes (Signed)
INITIAL NUTRITION ASSESSMENT  DOCUMENTATION CODES Per approved criteria  -Not Applicable    INTERVENTION: 1. Ensure Pudding po BID, each supplement provides 170 kcal and 4 grams of protein. Once diet advanced post Cath  2. RD will continue to follow     NUTRITION DIAGNOSIS: Inadequate oral intake  related to swallowing problems as evidenced by weight loss.   Goal: PO intake to meet >/=90% estimated nutrition needs.   Monitor:  PO intake, weight, SLP eval  Reason for Assessment: MST (Malnutrition Screening Tool)   71 y.o. female  Admitting Dx: Chest Pain   ASSESSMENT: Pt admitted with chest pain, planned on cardiac cath today.  Pt states that she has been having problems swallowing for several months. Feels as though the food gets about half way down and then becomes stuck. Both liquids and solids are problematic. Planned for SLP eval post cath.  Pt states that she has lost 20 lbs in the last few months. Per weight hx pt has lost 16 lbs in 2 months, (9.3% body weight), severe weight loss. Takes pt a very long time to eats meals and is limited to quantity of intake.  Height: Ht Readings from Last 1 Encounters:  07/27/12 5' 4.5" (1.638 m)   Weight: Wt Readings from Last 1 Encounters:  07/28/12 156 lb 12 oz (71.1 kg)   Ideal Body Weight: 55.7 kg   % Ideal Body Weight: 128%  Wt Readings from Last 10 Encounters:  07/28/12 156 lb 12 oz (71.1 kg)  07/28/12 156 lb 12 oz (71.1 kg)  06/18/12 161 lb (73.029 kg)  05/15/12 172 lb (78.019 kg)  05/06/12 172 lb 6.4 oz (78.2 kg)  04/22/12 169 lb 6 oz (76.828 kg)  01/31/12 174 lb 9.6 oz (79.198 kg)  01/03/12 173 lb 3.2 oz (78.563 kg)  12/07/11 175 lb (79.379 kg)  12/04/11 172 lb 12.8 oz (78.382 kg)   Usual Body Weight: 176 lbs per pt report  % Usual Body Weight: 89%  BMI:  Body mass index is 26.49 kg/(m^2). overweight   Estimated Nutritional Needs: Kcal: 1550-1850 Protein: 65-75 gm  Fluid: 1.6-1.9 L/day   Skin: intact    Diet Order: NPO  EDUCATION NEEDS: -No education needs identified at this time   Intake/Output Summary (Last 24 hours) at 07/28/12 1132 Last data filed at 07/28/12 0900  Gross per 24 hour  Intake 1895.13 ml  Output   1050 ml  Net 845.13 ml   Last BM: 12/16  Labs:   Lab 07/27/12 1400 07/27/12 0932 07/26/12 1800  NA 138 138 139  K 4.6 3.6 3.1*  CL 107 104 104  CO2 20 23 24   BUN 12 12 16   CREATININE 0.69 0.69 0.82  CALCIUM 8.5 8.2* 8.5  MG -- 1.9 --  PHOS -- -- --  GLUCOSE 104* 78 106*    CBG (last 3)  No results found for this basename: GLUCAP:3 in the last 72 hours  Scheduled Meds:   . aspirin EC  81 mg Oral Daily  . atorvastatin  80 mg Oral q1800  . diazepam  5 mg Oral On Call  . methylPREDNISolone  4 mg Oral TID WC  . metoprolol tartrate  12.5 mg Oral BID  . pantoprazole  40 mg Oral BID  . potassium chloride  20 mEq Oral BID  . potassium chloride  40 mEq Oral Once  . sodium chloride  3 mL Intravenous Q12H  . sodium chloride  3 mL Intravenous Q12H  . trimethoprim  100 mg Oral Daily   Continuous Infusions:   . sodium chloride 75 mL/hr at 07/27/12 1244  . heparin 1,650 Units/hr (07/27/12 2202)  . nitroGLYCERIN 5 mcg/min (07/27/12 0127)    Past Medical History  Diagnosis Date  . Pulmonary embolism   . Rheumatoid arthritis   . Hypertension   . Incontinence of feces   . Unspecified disorder of bladder   . Rectal prolapse   . Hiatal hernia   . IBS (irritable bowel syndrome)   . GERD (gastroesophageal reflux disease)   . Pain in limb   . Lumbago     hospital admission with back pain in April, 2011 repair of a disc herniation L4-L5   . Osteoarthrosis, unspecified whether generalized or localized, unspecified site   . COPD (chronic obstructive pulmonary disease)   . Tobacco use disorder   . CAD (coronary artery disease)     Nuclear, May, 2010, no ischemia / catheterization 2012, nonobstructive CAD ( 40% circumflex, 40% LAD) EF normal  . Carotid  artery disease     Doppler, July, 2010, 40-59% bilateral, stable, followup one year  . Adenomatous polyp 02/2008  . AVM (arteriovenous malformation)     Small colonic  . Ejection fraction     EF 55-60%, echo, April, 2011, grade 1 diastolic dysfunction.  . Shortness of breath     Cardiac evaluation, Dr.Katz, pulmonary evaluation, 2012, Dr. Delton Coombes.  Marland Kitchen UTI (lower urinary tract infection)     Possible UTI by history June 14, 2011,   . Gout   . Shoulder pain, right   . Spinal stenosis   . Hyperlipidemia   . Glaucoma   . PE (pulmonary embolism)   . Splenomegaly   . Fatty infiltration of liver   . Cystitis     Past Surgical History  Procedure Date  . Laminectomy     lumbar, HX of L4-5 x 2  . Cervical disc compression     c5-c6 Hx of  . Appendectomy   . Cholecystectomy   . Bladder surgery     x 2  . Vaginal hysterectomy   . Rectocele repair   . Interstim medronic stimulator 04/2010      Clarene Duke RD, LDN Pager [phone] After Hours pager [phone]

## 2012-07-29 ENCOUNTER — Inpatient Hospital Stay (HOSPITAL_COMMUNITY): Payer: Medicare Other

## 2012-07-29 DIAGNOSIS — I2510 Atherosclerotic heart disease of native coronary artery without angina pectoris: Secondary | ICD-10-CM

## 2012-07-29 DIAGNOSIS — R471 Dysarthria and anarthria: Secondary | ICD-10-CM

## 2012-07-29 DIAGNOSIS — R0989 Other specified symptoms and signs involving the circulatory and respiratory systems: Secondary | ICD-10-CM

## 2012-07-29 DIAGNOSIS — R1310 Dysphagia, unspecified: Secondary | ICD-10-CM

## 2012-07-29 LAB — CBC
HCT: 39.5 % (ref 36.0–46.0)
Hemoglobin: 13.2 g/dL (ref 12.0–15.0)
MCH: 29.4 pg (ref 26.0–34.0)
MCHC: 33.4 g/dL (ref 30.0–36.0)
MCV: 88 fL (ref 78.0–100.0)
Platelets: 252 10*3/uL (ref 150–400)
RBC: 4.49 MIL/uL (ref 3.87–5.11)
RDW: 15.6 % — ABNORMAL HIGH (ref 11.5–15.5)
WBC: 7.7 10*3/uL (ref 4.0–10.5)

## 2012-07-29 MED ORDER — ENSURE COMPLETE PO LIQD
237.0000 mL | Freq: Two times a day (BID) | ORAL | Status: DC
  Filled 2012-07-29 (×3): qty 237

## 2012-07-29 MED ORDER — LOSARTAN POTASSIUM 50 MG PO TABS
50.0000 mg | ORAL_TABLET | Freq: Every day | ORAL | Status: DC
  Administered 2012-07-29 – 2012-07-30 (×2): 50 mg via ORAL
  Filled 2012-07-29 (×2): qty 1

## 2012-07-29 NOTE — Progress Notes (Signed)
SUBJECTIVE: No complaints this am. No chest pain or SOB.   BP 125/57  Pulse 91  Temp 98 F (36.7 C) (Oral)  Resp 19  Ht 5' 4.5" (1.638 m)  Wt 156 lb 8.4 oz (71 kg)  BMI 26.45 kg/m2  SpO2 98%  Intake/Output Summary (Last 24 hours) at 07/29/12 1138 Last data filed at 07/29/12 1016  Gross per 24 hour  Intake 830.33 ml  Output      0 ml  Net 830.33 ml    PHYSICAL EXAM General: Well developed, well nourished, in no acute distress. Alert and oriented x 3.  Psych:  Good affect, responds appropriately Neck: No JVD. No masses noted.  Lungs: Clear bilaterally with no wheezes or rhonci noted.  Heart: RRR with no murmurs noted. Abdomen: Bowel sounds are present. Soft, non-tender.  Extremities: No lower extremity edema.   LABS: Basic Metabolic Panel:  Basename 07/27/12 1400 07/27/12 0932  NA 138 138  K 4.6 3.6  CL 107 104  CO2 20 23  GLUCOSE 104* 78  BUN 12 12  CREATININE 0.69 0.69  CALCIUM 8.5 8.2*  MG -- 1.9  PHOS -- --   CBC:  Basename 07/29/12 0435 07/28/12 0535 07/26/12 1800  WBC 7.7 7.5 --  NEUTROABS -- -- 8.0*  HGB 13.2 12.6 --  HCT 39.5 37.3 --  MCV 88.0 87.6 --  PLT 252 227 --   Cardiac Enzymes:  Basename 07/27/12 1400 07/27/12 0932 07/27/12 0155  CKTOTAL -- -- --  CKMB -- -- --  CKMBINDEX -- -- --  TROPONINI 3.39* 5.00* 13.30*   Fasting Lipid Panel:  Basename 07/27/12 0932  CHOL 213*  HDL 37*  LDLCALC 123*  TRIG 263*  CHOLHDL 5.8  LDLDIRECT --    Current Meds:    . aspirin EC  81 mg Oral Daily  . atorvastatin  80 mg Oral q1800  . feeding supplement  1 Container Oral BID BM  . methylPREDNISolone  4 mg Oral TID WC  . metoprolol tartrate  12.5 mg Oral BID  . pantoprazole  40 mg Oral BID  . potassium chloride  40 mEq Oral Once  . potassium chloride  20 mEq Oral BID  . sodium chloride  3 mL Intravenous Q12H  . trimethoprim  100 mg Oral Daily   Cardiac cath 07/28/12: Left mainstem: Separate ostia for the LAD and LCx  Left anterior  descending (LAD): Patent to the LV apex, but does not wrap around the apex. Mild nonobstructive plaque in the proximal and mid vessel. Diagonals are patent. Mid LAD with 40-50% stenosis  Left circumflex (LCx): Mild calcification. 40-50% proximal stenosis. OM branches patent.  Right coronary artery (RCA): Very large, dominant vessel. PDA and PLA branches are widely patent. No obstructive disease throughout.  Left ventriculography: Left ventricular systolic function is moderately depressed. The periapical region is akinetic. Basal segments are hyperdynamic. LVEF estimated at 40%  Echo 07/27/12: Left ventricle: Systolic function was mildly reduced. The estimated ejection fraction was in the range of 45% to 50%. There is akinesis of the mid-distalanteroseptal myocardium. Doppler parameters are consistent with abnormal left ventricular relaxation (grade 1 diastolic dysfunction).   ASSESSMENT AND PLAN: 71 yo female admitted 07/27/12 with chest pain and elevated troponin. Echo 07/27/12 with LVEF 45-50% with akinesis of the mid to distal anteroseptal myocardium. Cardiac cath as above on 07/28/12 with moderate non-obstructive CAD. 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 She has also demonstrated speech deficits  and balance issues over the last few weeks and has had more frequent issues with swallowing.   1. Cardiomyopathy: This is non-ischemic. She has moderate CAD by cath yesterday. ? If this could be a stress induced cardiomyopathy with apical akinesis. Will manage medically for now with beta blocker. Will add ARB. Repeat echo in 2 months. NO obvious LV apical thrombus on echo or on LV gram although there is apical akinesis so there is the possibility of recent apical thrombus.   2. CAD/NSTEMI: Medical management. See cath above.   3. Dysphagia: Pt did poorly with swallowing test this am. Appreciate speech therapy input. This could represent effects  from neurological event. She has seen Dr. Russella Dar in the past in [facility] and had a recent endoscopy. There is mention of possible esophageal motility issues. She also has some speech deficits with recent weakness and gait instability per patient and  Family raising the question of a possible neurological event. Head CT without evidence of CVA. I have discussed the findings of her swallowing study with ST and will ask GI and Neurology to see today to comment on her presentation and findings.   [name]  12/17/201311:38 AM

## 2012-07-29 NOTE — Consult Note (Signed)
NEURO HOSPITALIST CONSULT NOTE    Reason for Consult: dysphagia  HPI:                                                                                                                                          [name] is an 71 y.o. female who noted 2 weeks ago a suddn onset of inability to swallow both solids and liquids. Looking back in notes she has had this issue since 04/22/2012.  Per patient she had a esophogeal dilatation although there was no significant stenosis. After her procedure she noted a significant worsening of her swallowing function in addition to slurred speech. These symptoms have not improved. She has no history of swallowing dysfunction, family history of neuromuscular disorders. Patient is unable to obtain MRI due to lower back stimulator placed for chronic pain.   Past Medical History  Diagnosis Date  . Pulmonary embolism   . Rheumatoid arthritis   . Hypertension   . Incontinence of feces   . Unspecified disorder of bladder   . Rectal prolapse   . Hiatal hernia   . IBS (irritable bowel syndrome)   . GERD (gastroesophageal reflux disease)   . Pain in limb   . Lumbago     hospital admission with back pain in April, 2011 repair of a disc herniation L4-L5   . Osteoarthrosis, unspecified whether generalized or localized, unspecified site   . COPD (chronic obstructive pulmonary disease)   . Tobacco use disorder   . CAD (coronary artery disease)     Nuclear, May, 2010, no ischemia / catheterization 2012, nonobstructive CAD ( 40% circumflex, 40% LAD) EF normal  . Carotid artery disease     Doppler, July, 2010, 40-59% bilateral, stable, followup one year  . Adenomatous polyp 02/2008  . AVM (arteriovenous malformation)     Small colonic  . Ejection fraction     EF 55-60%, echo, April, 2011, grade 1 diastolic dysfunction.  . Shortness of breath     Cardiac evaluation, Dr.Katz, pulmonary evaluation, 2012, Dr. Delton Coombes.  Marland Kitchen UTI (lower urinary  tract infection)     Possible UTI by history June 14, 2011,   . Gout   . Shoulder pain, right   . Spinal stenosis   . Hyperlipidemia   . Glaucoma   . PE (pulmonary embolism)   . Splenomegaly   . Fatty infiltration of liver   . Cystitis     Past Surgical History  Procedure Date  . Laminectomy     lumbar, HX of L4-5 x 2  . Cervical disc compression     c5-c6 Hx of  . Appendectomy   . Cholecystectomy   . Bladder surgery     x 2  . Vaginal hysterectomy   . Rectocele repair   .  Interstim medronic stimulator 04/2010    Family History  Problem Relation Age of Onset  . Arthritis Brother   . Colon cancer Paternal Grandmother   . Lung cancer Father     x 3 uncles and a aunt  . Brain cancer Father   . Heart disease Mother   . Kidney disease Mother     chronic UTI  . Diabetes Mother   . Diabetes Sister   . Ovarian cancer Maternal Aunt   . Heart disease Maternal Uncle   . Kidney cancer Maternal Uncle   . Heart disease Paternal Aunt   . Pancreatic cancer Maternal Grandmother     Family History: Mother DM, HTN Father HTN, Cancer, MI  Social History:  reports that she quit smoking about 4 years ago. She has never used smokeless tobacco. She reports that she does not drink alcohol or use illicit drugs.  Allergies  Allergen Reactions  . Levofloxacin Swelling  . Nitrofurantoin     *MACROBID*  Tongue swelling    MEDICATIONS:                                                                                                                     Prior to Admission:  Prescriptions prior to admission  Medication Sig Dispense Refill  . Ascorbic Acid (VITAMIN C) 500 MG tablet Take 1,000 mg by mouth daily.       Marland Kitchen aspirin 81 MG tablet Take 81 mg by mouth daily.        . Cholecalciferol (VITAMIN D) 1000 UNITS capsule Take 1,000 Units by mouth 2 (two) times daily.        . cyanocobalamin 2000 MCG tablet Take 2,000 mcg by mouth daily.        . furosemide (LASIX) 40 MG tablet Take  40 mg by mouth daily.      . methylPREDNISolone (MEDROL) 4 MG tablet Take 4 mg by mouth 3 (three) times daily.      . nitroGLYCERIN (NITROSTAT) 0.4 MG SL tablet Place 1 tablet (0.4 mg total) under the tongue every 5 (five) minutes as needed for chest pain.  25 tablet  6  . pantoprazole (PROTONIX) 40 MG tablet Take 1 tablet (40 mg total) by mouth 2 (two) times daily.  60 tablet  11  . potassium chloride (KLOR-CON) 10 MEQ CR tablet Take 10 mEq by mouth daily.       . ranitidine (ZANTAC) 150 MG capsule Take 300 mg by mouth every evening.       . trimethoprim (TRIMPEX) 100 MG tablet Take 100 mg by mouth daily.       Scheduled:   . aspirin EC  81 mg Oral Daily  . atorvastatin  80 mg Oral q1800  . feeding supplement  237 mL Oral BID BM  . losartan  50 mg Oral Daily  . methylPREDNISolone  4 mg Oral TID WC  . metoprolol tartrate  12.5 mg Oral BID  . pantoprazole  40 mg Oral BID  .  potassium chloride  40 mEq Oral Once  . potassium chloride  20 mEq Oral BID  . sodium chloride  3 mL Intravenous Q12H  . trimethoprim  100 mg Oral Daily     ROS:                                                                                                                                       History obtained from the patient  General ROS: positive for -  weight loss Psychological ROS: negative for - behavioral disorder, hallucinations, memory difficulties, mood swings or suicidal ideation Ophthalmic ROS: negative for - blurry vision, double vision, eye pain or loss of vision ENT ROS: negative for - epistaxis, nasal discharge, oral lesions, sore throat, tinnitus or vertigo Allergy and Immunology ROS: negative for - hives or itchy/watery eyes Hematological and Lymphatic ROS: negative for - bleeding problems, bruising or swollen lymph nodes Endocrine ROS: negative for - galactorrhea, hair pattern changes, polydipsia/polyuria or temperature intolerance Respiratory ROS: negative for - cough, hemoptysis, shortness of  breath or wheezing Cardiovascular ROS: positive for - chest pain,  Gastrointestinal ROS: negative for - abdominal pain, diarrhea, hematemesis, nausea/vomiting or stool incontinence Genito-Urinary ROS: negative for - dysuria, hematuria, incontinence or urinary frequency/urgency Musculoskeletal ROS: positive for -  muscular weakness Neurological ROS: as noted in HPI Dermatological ROS: negative for rash and skin lesion changes   Blood pressure 128/53, pulse 70, temperature 98 F (36.7 C), temperature source Oral, resp. rate 20, height 5' 4.5" (1.638 m), weight 71 kg (156 lb 8.4 oz), SpO2 96.00%.   Neurologic Examination:                                                                                                      Mental Status: Alert, oriented, thought content appropriate.  Speech fluent without only slight dysarthria.  Able to follow 3 step commands without difficulty. Cranial Nerves: II: Discs flat bilaterally; Visual fields grossly normal, pupils equal, round, reactive to light and accommodation III,IV, VI: ptosis not present, extra-ocular motions intact bilaterally V,VII:  facial light touch sensation normal bilaterally; moderate orbicularis oris weakness VIII: hearing normal bilaterally IX,X: gag reflex present XI: bilateral shoulder shrug XII: midline tongue extension--tongue had glossy appearance; equivocal atrophy of the tongue; no fasciculations noted. Motor: Right : Upper extremity   4/5    Left:     Upper extremity   4/5  Lower extremity   4/5     Lower extremity   4/5 --throughout  muscle exam patient showed only limited effort due to multiple pain issues.   Tone and bulk:normal tone throughout; no atrophy noted, no fasiculations Sensory: Pinprick and light touch intact throughout, bilaterally Deep Tendon Reflexes: 2+ and symmetric throughout Plantars: Right: downgoing   Left: downgoing Cerebellar: normal finger-to-nose,  normal heel-to-shin test CV: pulses palpable  throughout     Lab Results  Component Value Date/Time   CHOL 213* 07/27/2012  9:32 AM    Results for orders placed during the hospital encounter of 07/26/12 (from the past 48 hour(s))  HEPARIN LEVEL (UNFRACTIONATED)     Status: Abnormal   Collection Time   07/27/12  8:38 PM      Component Value Range Comment   Heparin Unfractionated 0.20 (*) 0.30 - 0.70 IU/mL   CBC     Status: Abnormal   Collection Time   07/28/12  5:35 AM      Component Value Range Comment   WBC 7.5  4.0 - 10.5 K/uL    RBC 4.26  3.87 - 5.11 MIL/uL    Hemoglobin 12.6  12.0 - 15.0 g/dL    HCT 16.1  09.6 - 04.5 %    MCV 87.6  78.0 - 100.0 fL    MCH 29.6  26.0 - 34.0 pg    MCHC 33.8  30.0 - 36.0 g/dL    RDW 40.9 (*) 81.1 - 15.5 %    Platelets 227  150 - 400 K/uL   HEPARIN LEVEL (UNFRACTIONATED)     Status: Normal   Collection Time   07/28/12  5:35 AM      Component Value Range Comment   Heparin Unfractionated 0.39  0.30 - 0.70 IU/mL   CBC     Status: Abnormal   Collection Time   07/29/12  4:35 AM      Component Value Range Comment   WBC 7.7  4.0 - 10.5 K/uL    RBC 4.49  3.87 - 5.11 MIL/uL    Hemoglobin 13.2  12.0 - 15.0 g/dL    HCT 91.4  78.2 - 95.6 %    MCV 88.0  78.0 - 100.0 fL    MCH 29.4  26.0 - 34.0 pg    MCHC 33.4  30.0 - 36.0 g/dL    RDW 21.3 (*) 08.6 - 15.5 %    Platelets 252  150 - 400 K/uL     Dg Swallowing Func-speech Pathology  07/29/2012  Breck Coons [place], CCC-SLP     07/29/2012 12:06 PM Objective Swallowing Evaluation: Modified Barium Swallowing Study   Patient Details  Name: [name] MRN: [id number] Date of Birth: 1941/04/02  Today's Date: 07/29/2012 Time: 1030-1100 SLP Time Calculation (min): 30 min  Past Medical History:  Past Medical History  Diagnosis Date  . Pulmonary embolism   . Rheumatoid arthritis   . Hypertension   . Incontinence of feces   . Unspecified disorder of bladder   . Rectal prolapse   . Hiatal hernia   . IBS (irritable bowel syndrome)   . GERD  (gastroesophageal reflux disease)   . Pain in limb   . Lumbago     hospital admission with back pain in April, 2011 repair of a  disc herniation L4-L5   . Osteoarthrosis, unspecified whether generalized or localized,  unspecified site   . COPD (chronic obstructive pulmonary disease)   . Tobacco use disorder   . CAD (coronary artery disease)     Nuclear, May, 2010, no ischemia / catheterization 2012,  nonobstructive CAD ( 40% circumflex, 40% LAD) EF normal  . Carotid artery disease     Doppler, July, 2010, 40-59% bilateral, stable, followup one  year  . Adenomatous polyp 02/2008  . AVM (arteriovenous malformation)     Small colonic  . Ejection fraction     EF 55-60%, echo, April, 2011, grade 1 diastolic dysfunction.  . Shortness of breath     Cardiac evaluation, Dr.Katz, pulmonary evaluation, 2012, Dr.  Delton Coombes.  Marland Kitchen UTI (lower urinary tract infection)     Possible UTI by history June 14, 2011,   . Gout   . Shoulder pain, right   . Spinal stenosis   . Hyperlipidemia   . Glaucoma   . PE (pulmonary embolism)   . Splenomegaly   . Fatty infiltration of liver   . Cystitis    Past Surgical History:  Past Surgical History  Procedure Date  . Laminectomy     lumbar, HX of L4-5 x 2  . Cervical disc compression     c5-c6 Hx of  . Appendectomy   . Cholecystectomy   . Bladder surgery     x 2  . Vaginal hysterectomy   . Rectocele repair   . Interstim medronic stimulator 04/2010   HPI:  71 yr old admitted with chest pain. Has NSTEMI by EKG and  enzymes.  Underwent cath yesterday.  PMH:  COPD, SOB, UTI, PE,  CAD, AVM, GERD, Hiatal hernia, back pain with implanted  stimulator. Pt. expresses a globus sensation since 9/13.   Esophagram revealed normal peristalsis brief hesitation of barium  pill at GE  junction.  She reported having EGD in 10/13 revealing  hiatal hernia but overall normal EGD.  Report also states MD did  dilate the esophagus at the GE junction..  Pt. has experienced  speech difficulty for several weeks with increase in  dysphagia  symptoms.  Head CT negative (cannot have MRI).   MBS recommended  for full assessment of pharyngeal swallow function.     Assessment / Plan / Recommendation Clinical Impression  Dysphagia Diagnosis: Moderate oral phase dysphagia;Severe oral  phase dysphagia;Severe cervical esophageal phase dysphagia Clinical impression: Pt. exhibits moderate-severe pharyngeal and  severe cervical esophageal dysphagia.  Pharyngeal impairments  include decreased range of motion and strength with laryngeal  elevation, pharyngeal contraction and epiglottic deflection.   Thin liquid penetrated laryngeal vestibule during the swallow as  pt. attempted to compensate and prolong laryngeal elevation for  increased tracheal protection.  Barium exited vestibule during  the swallow.  Chin tuck and superglottic swallow technique were  not effective strategies to decrease or prevent penetration.  Pt.  also exhibited what appeared to be inadequate UES functioning  characterized by UES remaining patent after po's pass and with  solids/liquids ascending through the UES attempting to penetrate  laryngeal vestibule.  Pt. regurgitated/expectorated po's.  Full  liquids are recommended at this time.  SLP recommends GI consult  as well as neurology as pt. may have had a CVA.  SLP will see for  continued education and safety with recommended po's.    Treatment Recommendation  Therapy as outlined in treatment plan below    Diet Recommendation Thin liquid (full liquids)   Liquid Administration via: Cup;No straw Medication Administration:  (liquid form if able,or crushed in  applesauce) Supervision: Intermittent supervision to cue for compensatory  strategies;Patient able to self feed Compensations: Slow rate;Small sips/bites;Check for anterior loss Postural Changes and/or Swallow Maneuvers: Seated upright 90  degrees;Upright 30-60 min  after meal    Other  Recommendations Recommended Consults: MBS Oral Care Recommendations: Oral care BID   Follow Up  Recommendations   (to be determined)    Frequency and Duration min 1 x/week  1 week       SLP Swallow Goals Goal #3: Pt. will demonstate swallow precautions to minimize  aspiration risks with modified independence.       Reason for Referral Objectively evaluate swallowing function   Oral Phase Oral Preparation/Oral Phase Oral Phase: WFL   Pharyngeal Phase Pharyngeal Phase Pharyngeal Phase: Impaired Pharyngeal - Thin Pharyngeal - Thin Cup: Reduced pharyngeal peristalsis;Reduced  anterior laryngeal mobility;Reduced laryngeal elevation;Reduced  airway/laryngeal closure;Reduced epiglottic  inversion;Penetration/Aspiration during swallow;Pharyngeal  residue - valleculae;Pharyngeal residue - pyriform sinuses Penetration/Aspiration details (thin cup): Material enters  airway, remains ABOVE vocal cords then ejected out Pharyngeal - Thin Straw: Reduced pharyngeal peristalsis;Reduced  epiglottic inversion;Reduced anterior laryngeal mobility;Reduced  laryngeal elevation;Reduced airway/laryngeal  closure;Penetration/Aspiration during swallow;Pharyngeal residue  - valleculae;Pharyngeal residue - pyriform sinuses Penetration/Aspiration details (thin straw): Material enters  airway, remains ABOVE vocal cords then ejected out Pharyngeal - Solids Pharyngeal - Puree: Reduced pharyngeal peristalsis;Reduced  epiglottic inversion;Reduced anterior laryngeal mobility;Reduced  laryngeal elevation;Reduced airway/laryngeal closure;Pharyngeal  residue - valleculae;Pharyngeal residue - pyriform sinuses Pharyngeal - Mechanical Soft: Reduced pharyngeal  peristalsis;Reduced epiglottic inversion;Reduced anterior  laryngeal mobility;Reduced laryngeal elevation;Reduced  airway/laryngeal closure;Pharyngeal residue -  valleculae;Pharyngeal residue - pyriform sinuses  Cervical Esophageal Phase    GO    Cervical Esophageal Phase Cervical Esophageal Phase: Impaired Cervical Esophageal Phase - Pudding Pudding Teaspoon: Esophageal backflow into cervical   esophagus;Esophageal backflow into the pharynx Cervical Esophageal Phase - Thin Thin Cup: Esophageal backflow into cervical esophagus;Esophageal  backflow into the pharynx;Esophageal backflow into the larynx Thin Straw: Esophageal backflow into cervical  esophagus;Esophageal backflow into the pharynx;Esophageal  backflow into the larynx Cervical Esophageal Phase - Solids Mechanical Soft: Esophageal backflow into the pharynx;Esophageal  backflow into cervical esophagus         Breck Coons [place] M.Ed CCC-SLP Pager [phone]  07/29/2012     2 D echo: Left ventricle: Systolic function was mildly reduced. The estimated ejection fraction was in the range of 45% to 50%.  There is akinesis of the mid-distalanteroseptal myocardium.   Assessment/Plan: 71 YO female with 2 month history of progressive bulbar dysfunction with dysphagia and dysarthria. Etiology is unclear.   Recommend: 1) B12 level, sedimentation rate and total CK. 2) EMG/NCV study as out patient.     Felicie Morn PA-C [facility] [phone]  Patient was evaluated by me, and above assessment i and management recommendations were approved by me.  Venetia Maxon M.D. [facility] [phone]  07/29/2012, 3:27 PM

## 2012-07-29 NOTE — Evaluation (Signed)
Clinical/Bedside Swallow Evaluation Patient Details  Name: [name] MRN: [id number] Date of Birth: 08-12-1941  Today's Date: 07/29/2012 Time: 4098-1191 SLP Time Calculation (min): 16 min  Past Medical History:  Past Medical History  Diagnosis Date  . Pulmonary embolism   . Rheumatoid arthritis   . Hypertension   . Incontinence of feces   . Unspecified disorder of bladder   . Rectal prolapse   . Hiatal hernia   . IBS (irritable bowel syndrome)   . GERD (gastroesophageal reflux disease)   . Pain in limb   . Lumbago     hospital admission with back pain in April, 2011 repair of a disc herniation L4-L5   . Osteoarthrosis, unspecified whether generalized or localized, unspecified site   . COPD (chronic obstructive pulmonary disease)   . Tobacco use disorder   . CAD (coronary artery disease)     Nuclear, May, 2010, no ischemia / catheterization 2012, nonobstructive CAD ( 40% circumflex, 40% LAD) EF normal  . Carotid artery disease     Doppler, July, 2010, 40-59% bilateral, stable, followup one year  . Adenomatous polyp 02/2008  . AVM (arteriovenous malformation)     Small colonic  . Ejection fraction     EF 55-60%, echo, April, 2011, grade 1 diastolic dysfunction.  . Shortness of breath     Cardiac evaluation, Dr.Katz, pulmonary evaluation, 2012, Dr. Delton Coombes.  Marland Kitchen UTI (lower urinary tract infection)     Possible UTI by history June 14, 2011,   . Gout   . Shoulder pain, right   . Spinal stenosis   . Hyperlipidemia   . Glaucoma   . PE (pulmonary embolism)   . Splenomegaly   . Fatty infiltration of liver   . Cystitis    Past Surgical History:  Past Surgical History  Procedure Date  . Laminectomy     lumbar, HX of L4-5 x 2  . Cervical disc compression     c5-c6 Hx of  . Appendectomy   . Cholecystectomy   . Bladder surgery     x 2  . Vaginal hysterectomy   . Rectocele repair   . Interstim medronic stimulator 04/2010   HPI:  71 yr old admitted with chest  pain. Has NSTEMI by EKG and enzymes.  Underwent cath yesterday.  PMH:  COPD, SOB, UTI, PE, CAD, AVM, GERD, Hiatal hernia, back pain with implanted stimulator. Pt. expresses a globus sensation since 9/13.  Esophagram revealed normal peristalsis brief hesitation of barium pill at GE  junction.  She reported having EGD several months ago without dilation necessary.  Pt. has experienced speech difficulty for several weeks with increase in dysphagia symptoms.  Head CT negative (cannot have MRI).    Assessment / Plan / Recommendation Clinical Impression  Pt. exhibited mod-severe pharyngeal deficits at bedside such as decreased laryngeal elevation, immediate cough with thin water possibly due to premature spill versus decreased elevation.  Multiple swallows and decreased coordination of swallow.  Recommend objecitve study to assess oropharyngeal function.     Aspiration Risk  Moderate    Diet Recommendation  (presently going to xray for MBS)        Other  Recommendations Recommended Consults: MBS   Follow Up Recommendations   (to be determined)    Frequency and Duration            wallow Study Prior Functional Status          Oral/Motor/Sensory Function Overall Oral Motor/Sensory Function: Impaired Labial ROM: Reduced  left;Reduced right (slight protrusion only) Labial Symmetry:  (decreased tone?) Labial Strength: Reduced Lingual ROM:  (decr coordination) Lingual Symmetry: Within Functional Limits Lingual Sensation: Within Functional Limits Facial ROM: Reduced left;Reduced right Facial Strength: Reduced Velum:  (slightly decreased elevation) Mandible: Within Functional Limits   Ice Chips Ice chips: Not tested   Thin Liquid Thin Liquid: Impaired Presentation: Cup Pharyngeal  Phase Impairments: Decreased hyoid-laryngeal movement;Cough - Immediate;Multiple swallows    Nectar Thick Nectar Thick Liquid: Not tested   Honey Thick Honey Thick Liquid: Not tested   Puree Puree:  Impaired Presentation: Spoon Pharyngeal Phase Impairments: Decreased hyoid-laryngeal movement;Multiple swallows;Throat Clearing - Immediate   Solid       Solid: Impaired Pharyngeal Phase Impairments: Decreased hyoid-laryngeal movement;Multiple swallows       Breck Coons Pernell Lenoir M.Ed [employer] [phone]  07/29/2012

## 2012-07-29 NOTE — Progress Notes (Signed)
Brief Nutrition Note:  Notified by SLP that pt would be put on Thin full liquids for now due to dysphagia.  Will change supplement to Ensure Complete BID.  Kendell Bane RD, LDN, CNSC [phone] Pager [phone] After Hours Pager

## 2012-07-29 NOTE — Procedures (Signed)
Objective Swallowing Evaluation: Modified Barium Swallowing Study  Patient Details  Name: Kushi Kun MRN: [id number] Date of Birth: September 13, 1940  Today's Date: 07/29/2012 Time: 1030-1100 SLP Time Calculation (min): 30 min  Past Medical History:  Past Medical History  Diagnosis Date  . Pulmonary embolism   . Rheumatoid arthritis   . Hypertension   . Incontinence of feces   . Unspecified disorder of bladder   . Rectal prolapse   . Hiatal hernia   . IBS (irritable bowel syndrome)   . GERD (gastroesophageal reflux disease)   . Pain in limb   . Lumbago     hospital admission with back pain in April, 2011 repair of a disc herniation L4-L5   . Osteoarthrosis, unspecified whether generalized or localized, unspecified site   . COPD (chronic obstructive pulmonary disease)   . Tobacco use disorder   . CAD (coronary artery disease)     Nuclear, May, 2010, no ischemia / catheterization 2012, nonobstructive CAD ( 40% circumflex, 40% LAD) EF normal  . Carotid artery disease     Doppler, July, 2010, 40-59% bilateral, stable, followup one year  . Adenomatous polyp 02/2008  . AVM (arteriovenous malformation)     Small colonic  . Ejection fraction     EF 55-60%, echo, April, 2011, grade 1 diastolic dysfunction.  . Shortness of breath     Cardiac evaluation, Dr.Katz, pulmonary evaluation, 2012, Dr. Delton Coombes.  Marland Kitchen UTI (lower urinary tract infection)     Possible UTI by history June 14, 2011,   . Gout   . Shoulder pain, right   . Spinal stenosis   . Hyperlipidemia   . Glaucoma   . PE (pulmonary embolism)   . Splenomegaly   . Fatty infiltration of liver   . Cystitis    Past Surgical History:  Past Surgical History  Procedure Date  . Laminectomy     lumbar, HX of L4-5 x 2  . Cervical disc compression     c5-c6 Hx of  . Appendectomy   . Cholecystectomy   . Bladder surgery     x 2  . Vaginal hysterectomy   . Rectocele repair   . Interstim medronic stimulator 04/2010   HPI:   71 yr old admitted with chest pain. Has NSTEMI by EKG and enzymes.  Underwent cath yesterday.  PMH:  COPD, SOB, UTI, PE, CAD, AVM, GERD, Hiatal hernia, back pain with implanted stimulator. Pt. expresses a globus sensation since 9/13.  Esophagram revealed normal peristalsis brief hesitation of barium pill at GE  junction.  She reported having EGD in 10/13 revealing hiatal hernia but overall normal EGD.  Report also states MD did dilate the esophagus at the GE junction..  Pt. has experienced speech difficulty for several weeks with increase in dysphagia symptoms.  Head CT negative (cannot have MRI).   MBS recommended for full assessment of pharyngeal swallow function.     Assessment / Plan / Recommendation Clinical Impression  Dysphagia Diagnosis: Moderate oral phase dysphagia;Severe oral phase dysphagia;Severe cervical esophageal phase dysphagia Clinical impression: Pt. exhibits moderate-severe pharyngeal and severe cervical esophageal dysphagia.  Pharyngeal impairments include decreased range of motion and strength with laryngeal elevation, pharyngeal contraction and epiglottic deflection.  Thin liquid penetrated laryngeal vestibule during the swallow as pt. attempted to compensate and prolong laryngeal elevation for increased tracheal protection.  Barium exited vestibule during the swallow.  Chin tuck and superglottic swallow technique were not effective strategies to decrease or prevent penetration.  Pt. also exhibited what  appeared to be inadequate UES functioning characterized by UES remaining patent after po's pass and with solids/liquids ascending through the UES attempting to penetrate laryngeal vestibule.  Pt. regurgitated/expectorated po's.  Full liquids are recommended at this time.  SLP recommends GI consult as well as neurology as pt. may have had a CVA.  SLP will see for continued education and safety with recommended po's.    Treatment Recommendation  Therapy as outlined in treatment plan  below    Diet Recommendation Thin liquid (full liquids)   Liquid Administration via: Cup;No straw Medication Administration:  (liquid form if able,or crushed in applesauce) Supervision: Intermittent supervision to cue for compensatory strategies;Patient able to self feed Compensations: Slow rate;Small sips/bites;Check for anterior loss Postural Changes and/or Swallow Maneuvers: Seated upright 90 degrees;Upright 30-60 min after meal    Other  Recommendations Recommended Consults: MBS Oral Care Recommendations: Oral care BID   Follow Up Recommendations   (to be determined)    Frequency and Duration min 1 x/week  1 week       SLP Swallow Goals Goal #3: Pt. will demonstate swallow precautions to minimize aspiration risks with modified independence.       Reason for Referral Objectively evaluate swallowing function   Oral Phase Oral Preparation/Oral Phase Oral Phase: WFL   Pharyngeal Phase Pharyngeal Phase Pharyngeal Phase: Impaired Pharyngeal - Thin Pharyngeal - Thin Cup: Reduced pharyngeal peristalsis;Reduced anterior laryngeal mobility;Reduced laryngeal elevation;Reduced airway/laryngeal closure;Reduced epiglottic inversion;Penetration/Aspiration during swallow;Pharyngeal residue - valleculae;Pharyngeal residue - pyriform sinuses Penetration/Aspiration details (thin cup): Material enters airway, remains ABOVE vocal cords then ejected out Pharyngeal - Thin Straw: Reduced pharyngeal peristalsis;Reduced epiglottic inversion;Reduced anterior laryngeal mobility;Reduced laryngeal elevation;Reduced airway/laryngeal closure;Penetration/Aspiration during swallow;Pharyngeal residue - valleculae;Pharyngeal residue - pyriform sinuses Penetration/Aspiration details (thin straw): Material enters airway, remains ABOVE vocal cords then ejected out Pharyngeal - Solids Pharyngeal - Puree: Reduced pharyngeal peristalsis;Reduced epiglottic inversion;Reduced anterior laryngeal mobility;Reduced  laryngeal elevation;Reduced airway/laryngeal closure;Pharyngeal residue - valleculae;Pharyngeal residue - pyriform sinuses Pharyngeal - Mechanical Soft: Reduced pharyngeal peristalsis;Reduced epiglottic inversion;Reduced anterior laryngeal mobility;Reduced laryngeal elevation;Reduced airway/laryngeal closure;Pharyngeal residue - valleculae;Pharyngeal residue - pyriform sinuses  Cervical Esophageal Phase    GO    Cervical Esophageal Phase Cervical Esophageal Phase: Impaired Cervical Esophageal Phase - Pudding Pudding Teaspoon: Esophageal backflow into cervical esophagus;Esophageal backflow into the pharynx Cervical Esophageal Phase - Thin Thin Cup: Esophageal backflow into cervical esophagus;Esophageal backflow into the pharynx;Esophageal backflow into the larynx Thin Straw: Esophageal backflow into cervical esophagus;Esophageal backflow into the pharynx;Esophageal backflow into the larynx Cervical Esophageal Phase - Solids Mechanical Soft: Esophageal backflow into the pharynx;Esophageal backflow into cervical esophagus         Breck Coons Huberta Tompkins M.Ed [employer] [phone]  07/29/2012

## 2012-07-30 ENCOUNTER — Encounter (HOSPITAL_COMMUNITY): Payer: Self-pay | Admitting: Nurse Practitioner

## 2012-07-30 ENCOUNTER — Telehealth: Payer: Self-pay | Admitting: Cardiology

## 2012-07-30 LAB — BASIC METABOLIC PANEL
BUN: 10 mg/dL (ref 6–23)
CO2: 26 mEq/L (ref 19–32)
Calcium: 9.3 mg/dL (ref 8.4–10.5)
Glucose, Bld: 89 mg/dL (ref 70–99)
Potassium: 4.7 mEq/L (ref 3.5–5.1)
Sodium: 141 mEq/L (ref 135–145)

## 2012-07-30 LAB — CK TOTAL AND CKMB (NOT AT ARMC)
CK, MB: 116.3 ng/mL (ref 0.3–4.0)
Total CK: 336 U/L — ABNORMAL HIGH (ref 7–177)

## 2012-07-30 LAB — CBC
HCT: 41.6 % (ref 36.0–46.0)
Hemoglobin: 13.6 g/dL (ref 12.0–15.0)
MCHC: 32.7 g/dL (ref 30.0–36.0)
RBC: 4.72 MIL/uL (ref 3.87–5.11)

## 2012-07-30 LAB — LACTIC ACID, PLASMA: Lactic Acid, Venous: 1.2 mmol/L (ref 0.5–2.2)

## 2012-07-30 MED ORDER — METOPROLOL SUCCINATE ER 25 MG PO TB24: 25.0000 mg | ORAL_TABLET | Freq: Every day | ORAL | Status: DC

## 2012-07-30 MED ORDER — LOSARTAN POTASSIUM 50 MG PO TABS: 50.0000 mg | ORAL_TABLET | Freq: Every day | ORAL | Status: DC

## 2012-07-30 MED ORDER — POTASSIUM CHLORIDE 10 MEQ PO TBCR: 20.0000 meq | EXTENDED_RELEASE_TABLET | Freq: Every day | ORAL | Status: DC

## 2012-07-30 MED ORDER — ATORVASTATIN CALCIUM 80 MG PO TABS: 80.0000 mg | ORAL_TABLET | Freq: Every day | ORAL | Status: AC

## 2012-07-30 NOTE — Progress Notes (Signed)
Subjective: Patient continues to have difficulty with swallowing.  Also reports difficulty wit facial sensation and opening her mouth.  Has generalized weakness as well. Reports that this has been stable and wishes to go home today.     Objective: Current vital signs: BP 145/82  Pulse 75  Temp 97.5 F (36.4 C) (Oral)  Resp 18  Ht 5' 4.5" (1.638 m)  Wt 71.5 kg (157 lb 10.1 oz)  BMI 26.64 kg/m2  SpO2 98% Vital signs in last 24 hours: Temp:  [97.5 F (36.4 C)-98 F (36.7 C)] 97.5 F (36.4 C) (12/18 0411) Pulse Rate:  [70-91] 75  (12/18 0411) Resp:  [16-20] 18  (12/18 0411) BP: (117-150)/(52-92) 145/82 mmHg (12/18 0411) SpO2:  [96 %-100 %] 98 % (12/18 0411) Weight:  [71.5 kg (157 lb 10.1 oz)] 71.5 kg (157 lb 10.1 oz) (12/17 1741)  Intake/Output from previous day: 12/17 0701 - 12/18 0700 In: 680 [P.O.:680] Out: -  Intake/Output this shift:   Nutritional status: Full Liquid  Neurologic Exam: Mental Status: Alert, oriented, thought content appropriate.  Dysarthria.  Able to follow 3 step commands without difficulty. Cranial Nerves: II: Discs flat bilaterally; Visual fields grossly normal, pupils equal, round, reactive to light and accommodation III,IV, VI: ptosis not present, extra-ocular motions intact bilaterally V,VII: Unable to smile with limitation on facial muscle movement, left greater than right.  Decreased sensation on the face in a patchy distribution. VIII: hearing normal bilaterally IX,X: gag reflex reduced XI: bilateral shoulder shrug XII: midline tongue extension Motor: Right : Upper extremity   4/5    Left:     Upper extremity   4/5  Lower extremity   4/5     Lower extremity   4/5 Tone and bulk:normal tone throughout; no atrophy noted Sensory: Pinprick and light touch intact throughout, bilaterally Deep Tendon Reflexes: 2+ and symmetric throughout Plantars: Right: downgoing   Left: downgoing Cerebellar: normal finger-to-nose, normal rapid alternating  movements and normal heel-to-shin test  Lab Results: Basic Metabolic Panel:  Lab 07/30/12 1324 07/27/12 1400 07/27/12 0932 07/26/12 1800  NA 141 138 138 139  K 4.7 4.6 3.6 3.1*  CL 106 107 104 104  CO2 26 20 23 24   GLUCOSE 89 104* 78 106*  BUN 10 12 12 16   CREATININE 0.79 0.69 0.69 0.82  CALCIUM 9.3 8.5 8.2* --  MG -- -- 1.9 --  PHOS -- -- -- --    Liver Function Tests: No results found for this basename: AST:5,ALT:5,ALKPHOS:5,BILITOT:5,PROT:5,ALBUMIN:5 in the last 168 hours No results found for this basename: LIPASE:5,AMYLASE:5 in the last 168 hours No results found for this basename: AMMONIA:3 in the last 168 hours  CBC:  Lab 07/30/12 0407 07/29/12 0435 07/28/12 0535 07/27/12 0932 07/26/12 1800  WBC 7.8 7.7 7.5 6.4 10.2  NEUTROABS -- -- -- -- 8.0*  HGB 13.6 13.2 12.6 13.6 12.7  HCT 41.6 39.5 37.3 40.4 36.9  MCV 88.1 88.0 87.6 86.3 86.0  PLT 260 252 227 233 246    Cardiac Enzymes:  Lab 07/30/12 0407 07/27/12 1400 07/27/12 0932 07/27/12 0155 07/26/12 2102 07/26/12 1800  CKTOTAL 336* -- -- -- -- --  CKMB 116.3* -- -- -- -- --  CKMBINDEX -- -- -- -- -- --  TROPONINI -- 3.39* 5.00* 13.30* 7.02* 0.71*    Lipid Panel:  Lab 07/27/12 0932  CHOL 213*  TRIG 263*  HDL 37*  CHOLHDL 5.8  VLDL 53*  LDLCALC 123*    CBG: No results found for this  basename: GLUCAP:5 in the last 168 hours  Microbiology: Results for orders placed during the hospital encounter of 07/26/12  MRSA PCR SCREENING     Status: Normal   Collection Time   07/27/12 12:11 AM      Component Value Range Status Comment   MRSA by PCR NEGATIVE  NEGATIVE Final     Coagulation Studies: No results found for this basename: LABPROT:5,INR:5 in the last 72 hours  Imaging: Dg Swallowing Func-speech Pathology  07/29/2012  Breck Coons [place], CCC-SLP     07/29/2012 12:06 PM Objective Swallowing Evaluation: Modified Barium Swallowing Study   Patient Details  Name: Petrina Melby MRN: [id number] Date of  Birth: 1941-06-12  Today's Date: 07/29/2012 Time: 1030-1100 SLP Time Calculation (min): 30 min  Past Medical History:  Past Medical History  Diagnosis Date  . Pulmonary embolism   . Rheumatoid arthritis   . Hypertension   . Incontinence of feces   . Unspecified disorder of bladder   . Rectal prolapse   . Hiatal hernia   . IBS (irritable bowel syndrome)   . GERD (gastroesophageal reflux disease)   . Pain in limb   . Lumbago     hospital admission with back pain in April, 2011 repair of a  disc herniation L4-L5   . Osteoarthrosis, unspecified whether generalized or localized,  unspecified site   . COPD (chronic obstructive pulmonary disease)   . Tobacco use disorder   . CAD (coronary artery disease)     Nuclear, May, 2010, no ischemia / catheterization 2012,  nonobstructive CAD ( 40% circumflex, 40% LAD) EF normal  . Carotid artery disease     Doppler, July, 2010, 40-59% bilateral, stable, followup one  year  . Adenomatous polyp 02/2008  . AVM (arteriovenous malformation)     Small colonic  . Ejection fraction     EF 55-60%, echo, April, 2011, grade 1 diastolic dysfunction.  . Shortness of breath     Cardiac evaluation, Dr.Katz, pulmonary evaluation, 2012, Dr.  Delton Coombes.  Marland Kitchen UTI (lower urinary tract infection)     Possible UTI by history June 14, 2011,   . Gout   . Shoulder pain, right   . Spinal stenosis   . Hyperlipidemia   . Glaucoma   . PE (pulmonary embolism)   . Splenomegaly   . Fatty infiltration of liver   . Cystitis    Past Surgical History:  Past Surgical History  Procedure Date  . Laminectomy     lumbar, HX of L4-5 x 2  . Cervical disc compression     c5-c6 Hx of  . Appendectomy   . Cholecystectomy   . Bladder surgery     x 2  . Vaginal hysterectomy   . Rectocele repair   . Interstim medronic stimulator 04/2010   HPI:  71 yr old admitted with chest pain. Has NSTEMI by EKG and  enzymes.  Underwent cath yesterday.  PMH:  COPD, SOB, UTI, PE,  CAD, AVM, GERD, Hiatal hernia, back pain with implanted  stimulator. Pt.  expresses a globus sensation since 9/13.   Esophagram revealed normal peristalsis brief hesitation of barium  pill at GE  junction.  She reported having EGD in 10/13 revealing  hiatal hernia but overall normal EGD.  Report also states MD did  dilate the esophagus at the GE junction..  Pt. has experienced  speech difficulty for several weeks with increase in dysphagia  symptoms.  Head CT negative (cannot have MRI).   MBS recommended  for full assessment of pharyngeal swallow function.     Assessment / Plan / Recommendation Clinical Impression  Dysphagia Diagnosis: Moderate oral phase dysphagia;Severe oral  phase dysphagia;Severe cervical esophageal phase dysphagia Clinical impression: Pt. exhibits moderate-severe pharyngeal and  severe cervical esophageal dysphagia.  Pharyngeal impairments  include decreased range of motion and strength with laryngeal  elevation, pharyngeal contraction and epiglottic deflection.   Thin liquid penetrated laryngeal vestibule during the swallow as  pt. attempted to compensate and prolong laryngeal elevation for  increased tracheal protection.  Barium exited vestibule during  the swallow.  Chin tuck and superglottic swallow technique were  not effective strategies to decrease or prevent penetration.  Pt.  also exhibited what appeared to be inadequate UES functioning  characterized by UES remaining patent after po's pass and with  solids/liquids ascending through the UES attempting to penetrate  laryngeal vestibule.  Pt. regurgitated/expectorated po's.  Full  liquids are recommended at this time.  SLP recommends GI consult  as well as neurology as pt. may have had a CVA.  SLP will see for  continued education and safety with recommended po's.    Treatment Recommendation  Therapy as outlined in treatment plan below    Diet Recommendation Thin liquid (full liquids)   Liquid Administration via: Cup;No straw Medication Administration:  (liquid form if able,or crushed in  applesauce)  Supervision: Intermittent supervision to cue for compensatory  strategies;Patient able to self feed Compensations: Slow rate;Small sips/bites;Check for anterior loss Postural Changes and/or Swallow Maneuvers: Seated upright 90  degrees;Upright 30-60 min after meal    Other  Recommendations Recommended Consults: MBS Oral Care Recommendations: Oral care BID   Follow Up Recommendations   (to be determined)    Frequency and Duration min 1 x/week  1 week       SLP Swallow Goals Goal #3: Pt. will demonstate swallow precautions to minimize  aspiration risks with modified independence.       Reason for Referral Objectively evaluate swallowing function   Oral Phase Oral Preparation/Oral Phase Oral Phase: WFL   Pharyngeal Phase Pharyngeal Phase Pharyngeal Phase: Impaired Pharyngeal - Thin Pharyngeal - Thin Cup: Reduced pharyngeal peristalsis;Reduced  anterior laryngeal mobility;Reduced laryngeal elevation;Reduced  airway/laryngeal closure;Reduced epiglottic  inversion;Penetration/Aspiration during swallow;Pharyngeal  residue - valleculae;Pharyngeal residue - pyriform sinuses Penetration/Aspiration details (thin cup): Material enters  airway, remains ABOVE vocal cords then ejected out Pharyngeal - Thin Straw: Reduced pharyngeal peristalsis;Reduced  epiglottic inversion;Reduced anterior laryngeal mobility;Reduced  laryngeal elevation;Reduced airway/laryngeal  closure;Penetration/Aspiration during swallow;Pharyngeal residue  - valleculae;Pharyngeal residue - pyriform sinuses Penetration/Aspiration details (thin straw): Material enters  airway, remains ABOVE vocal cords then ejected out Pharyngeal - Solids Pharyngeal - Puree: Reduced pharyngeal peristalsis;Reduced  epiglottic inversion;Reduced anterior laryngeal mobility;Reduced  laryngeal elevation;Reduced airway/laryngeal closure;Pharyngeal  residue - valleculae;Pharyngeal residue - pyriform sinuses Pharyngeal - Mechanical Soft: Reduced pharyngeal  peristalsis;Reduced  epiglottic inversion;Reduced anterior  laryngeal mobility;Reduced laryngeal elevation;Reduced  airway/laryngeal closure;Pharyngeal residue -  valleculae;Pharyngeal residue - pyriform sinuses  Cervical Esophageal Phase    GO    Cervical Esophageal Phase Cervical Esophageal Phase: Impaired Cervical Esophageal Phase - Pudding Pudding Teaspoon: Esophageal backflow into cervical  esophagus;Esophageal backflow into the pharynx Cervical Esophageal Phase - Thin Thin Cup: Esophageal backflow into cervical esophagus;Esophageal  backflow into the pharynx;Esophageal backflow into the larynx Thin Straw: Esophageal backflow into cervical  esophagus;Esophageal backflow into the pharynx;Esophageal  backflow into the larynx Cervical Esophageal Phase - Solids Mechanical Soft: Esophageal backflow into the pharynx;Esophageal  backflow into cervical esophagus  Breck Coons [place].Ed CCC-SLP Pager [phone]  07/29/2012      Medications:  I have reviewed the patient's current medications. Scheduled:   . aspirin EC  81 mg Oral Daily  . atorvastatin  80 mg Oral q1800  . feeding supplement  237 mL Oral BID BM  . losartan  50 mg Oral Daily  . methylPREDNISolone  4 mg Oral TID WC  . metoprolol tartrate  12.5 mg Oral BID  . pantoprazole  40 mg Oral BID  . potassium chloride  40 mEq Oral Once  . potassium chloride  20 mEq Oral BID  . sodium chloride  3 mL Intravenous Q12H  . trimethoprim  100 mg Oral Daily    Assessment/Plan:  Patient Active Hospital Problem List: Dysarthria/Dysphagia (07/29/2012)   Assessment: Symptoms continue.  ESR of 2.  CK of 116.3.  Myasthenia gravis is in the differential as well.  Further work up to be ordered.     Plan:  1.  Patient to have AchR-ab, anti-striatal antibody and anti-MUSK drawn today.  These results may be followed up as an outpatient.  2.  Agree with patient having a NCV/EMG as an outpatient.       LOS: 4 days   Thana Farr, MD [facility] [phone] 07/30/2012  9:32 AM

## 2012-07-30 NOTE — Progress Notes (Signed)
CRITICAL VALUE ALERT  Critical value received:  CKMB 116.3  Date of notification:  07/30/2012   Time of notification:  0555  Critical value read back:yes  Nurse who received alert:  Saivon Prowse, Avie Echevaria , RN  MD notified (1st page):  PA on call  Time of first page:  0600  MD notified (2nd page):PA on call  Time of second page:0630  Responding MD:  Awaiting call back  Time MD responded:  Awaiting call back

## 2012-07-30 NOTE — Progress Notes (Signed)
Patient ID: [name], female   DOB: [birth year], 71 y.o.   MRN: [id number]   SUBJECTIVE:  Patient is feeling much better. She wants to go home today. She tells me that neurology has been in again this morning. She is told that she will have outpatient neurology studies arranged.   Filed Vitals:   07/29/12 1414 07/29/12 1741 07/29/12 2026 07/30/12 0411  BP:  150/92 117/52 145/82  Pulse:  79 82 75  Temp:   97.6 F (36.4 C) 97.5 F (36.4 C)  TempSrc:   Oral Oral  Resp:  16 18 18   Height:      Weight:  157 lb 10.1 oz (71.5 kg)    SpO2: 96% 100% 99% 98%    Intake/Output Summary (Last 24 hours) at 07/30/12 0912 Last data filed at 07/29/12 1400  Gross per 24 hour  Intake    440 ml  Output      0 ml  Net    440 ml    LABS: Basic Metabolic Panel:  Basename 07/30/12 0407 07/27/12 1400 07/27/12 0932  NA 141 138 --  K 4.7 4.6 --  CL 106 107 --  CO2 26 20 --  GLUCOSE 89 104* --  BUN 10 12 --  CREATININE 0.79 0.69 --  CALCIUM 9.3 8.5 --  MG -- -- 1.9  PHOS -- -- --   Liver Function Tests: No results found for this basename: AST:2,ALT:2,ALKPHOS:2,BILITOT:2,PROT:2,ALBUMIN:2 in the last 72 hours No results found for this basename: LIPASE:2,AMYLASE:2 in the last 72 hours CBC:  Basename 07/30/12 0407 07/29/12 0435  WBC 7.8 7.7  NEUTROABS -- --  HGB 13.6 13.2  HCT 41.6 39.5  MCV 88.1 88.0  PLT 260 252   Cardiac Enzymes:  Basename 07/30/12 0407 07/27/12 1400 07/27/12 0932  CKTOTAL 336* -- --  CKMB 116.3* -- --  CKMBINDEX -- -- --  TROPONINI -- 3.39* 5.00*   BNP: No components found with this basename: POCBNP:3 D-Dimer: No results found for this basename: DDIMER:2 in the last 72 hours Hemoglobin A1C: No results found for this basename: HGBA1C in the last 72 hours Fasting Lipid Panel:  Basename 07/27/12 0932  CHOL 213*  HDL 37*  LDLCALC 123*  TRIG 263*  CHOLHDL 5.8  LDLDIRECT --   Thyroid Function Tests:  Basename 07/27/12 0932  TSH 2.985  T4TOTAL  --  T3FREE --  THYROIDAB --    RADIOLOGY: Ct Head Wo Contrast  07/26/2012  *RADIOLOGY REPORT*  Clinical Data: Slurred speech, hypertension, history smoking, coronary artery disease  CT HEAD WITHOUT CONTRAST  Technique:  Contiguous axial images were obtained from the base of the skull through the vertex without contrast.  Comparison: None.  Findings: Normal ventricular morphology. No midline shift or mass effect. Normal appearance of brain parenchyma. No intracranial hemorrhage, mass lesion, or acute infarction. Visualized paranasal sinuses and mastoid air cells clear. Bones unremarkable.  IMPRESSION: No acute intracranial abnormalities.   Original Report Authenticated By: Ulyses Southward, M.D.    Ct Angio Chest Pe W/cm &/or Wo Cm  07/26/2012  *RADIOLOGY REPORT*  Clinical Data: Sudden onset chest pain 2 hours ago, nausea, vomiting, increased pain with movement, past history of cholecystectomy, gout, pulmonary embolism  CT ANGIOGRAPHY CHEST  Technique:  Multidetector CT imaging of the chest using the standard protocol during bolus administration of intravenous contrast. Multiplanar reconstructed images including MIPs were obtained and reviewed to evaluate the vascular anatomy.  Contrast:  80 ml Omnipaque-350 IV  Comparison: CT chest 01/03/2012  Findings: Scattered atherosclerotic calcifications aorta and coronary arteries. No aortic aneurysm or dissection. Scattered normal-sized mediastinal lymph nodes. Spleen appears enlarged, 12.5 x 8.9 cm image 89, length not fully imaged. Pulmonary arteries patent. No evidence pulmonary embolism. Minimal dependent atelectasis in both lungs. No acute infiltrate, pleural effusion or pneumothorax. Osseous structures unremarkable.  IMPRESSION: No evidence pulmonary embolism. Dependent atelectasis in both lungs. Scattered atherosclerotic disease changes of the aorta and coronary arteries. Question splenomegaly, incompletely imaged.   Original Report Authenticated By: Ulyses Southward,  M.D.    Ct Abdomen Pelvis W Contrast  06/30/2012  *RADIOLOGY REPORT*  Clinical Data: Elevated liver function test.  20 pounds weight loss.  Nausea vomiting.  Abdominal pain.  Anorexia.  CT ABDOMEN AND PELVIS WITH CONTRAST  Technique:  Multidetector CT imaging of the abdomen and pelvis was performed following the standard protocol during bolus administration of intravenous contrast.  Contrast: OMNIPAQUE IOHEXOL 300 MG/ML  SOLN  Comparison: 12/31/2010  Findings: Liver is normal in appearance.  No liver masses are identified.  Prior cholecystectomy again noted.  The pancreas, spleen, adrenal glands, and right kidney are normal appearance. Tiny left renal cyst noted, however there is no evidence of renal masses or hydronephrosis.  No soft tissue masses or lymphadenopathy identified within the abdomen or pelvis. Ectasia and calcified plaque again seen involving the abdominal aorta, without evidence of aneurysm.  Previous hysterectomy noted.  Adnexal regions are unremarkable in appearance.  No inflammatory process or abnormal fluid collections are identified within the abdomen or pelvis.  There is no evidence of bowel wall thickening, dilatation, or hernia.  No suspicious bone lesions are identified.  IMPRESSION: Stable exam.  No acute findings or other significant abnormality identified.   Original Report Authenticated By: Myles Rosenthal, M.D.    Dg Chest Port 1 View  07/26/2012  *RADIOLOGY REPORT*  Clinical Data: Short of breath.  Cough.  Chest pain.  PORTABLE CHEST - 1 VIEW  Comparison: 06/03/2011  Findings: Chronic interstitial prominence in the right lung base is stable.  No evidence of acute infiltrate or pleural effusion. Heart size is stable and within normal limits.  IMPRESSION: Stable chronic interstitial prominence in right lung base.  No acute findings.   Original Report Authenticated By: Myles Rosenthal, M.D.    Dg Swallowing Func-speech Pathology  07/29/2012  Breck Coons [place], CCC-SLP      07/29/2012 12:06 PM Objective Swallowing Evaluation: Modified Barium Swallowing Study   Patient Details  Name: [name] MRN: [id number] Date of Birth: 10/02/1940  Today's Date: 07/29/2012 Time: 1030-1100 SLP Time Calculation (min): 30 min  Past Medical History:  Past Medical History  Diagnosis Date  . Pulmonary embolism   . Rheumatoid arthritis   . Hypertension   . Incontinence of feces   . Unspecified disorder of bladder   . Rectal prolapse   . Hiatal hernia   . IBS (irritable bowel syndrome)   . GERD (gastroesophageal reflux disease)   . Pain in limb   . Lumbago     hospital admission with back pain in April, 2011 repair of a  disc herniation L4-L5   . Osteoarthrosis, unspecified whether generalized or localized,  unspecified site   . COPD (chronic obstructive pulmonary disease)   . Tobacco use disorder   . CAD (coronary artery disease)     Nuclear, May, 2010, no ischemia / catheterization 2012,  nonobstructive CAD ( 40% circumflex, 40% LAD) EF normal  . Carotid artery disease     Doppler, July,  2010, 40-59% bilateral, stable, followup one  year  . Adenomatous polyp 02/2008  . AVM (arteriovenous malformation)     Small colonic  . Ejection fraction     EF 55-60%, echo, April, 2011, grade 1 diastolic dysfunction.  . Shortness of breath     Cardiac evaluation, Dr.Casson Catena, pulmonary evaluation, 2012, Dr.  Delton Coombes.  Marland Kitchen UTI (lower urinary tract infection)     Possible UTI by history June 14, 2011,   . Gout   . Shoulder pain, right   . Spinal stenosis   . Hyperlipidemia   . Glaucoma   . PE (pulmonary embolism)   . Splenomegaly   . Fatty infiltration of liver   . Cystitis    Past Surgical History:  Past Surgical History  Procedure Date  . Laminectomy     lumbar, HX of L4-5 x 2  . Cervical disc compression     c5-c6 Hx of  . Appendectomy   . Cholecystectomy   . Bladder surgery     x 2  . Vaginal hysterectomy   . Rectocele repair   . Interstim medronic stimulator 04/2010   HPI:  71 yr old admitted with chest pain. Has  NSTEMI by EKG and  enzymes.  Underwent cath yesterday.  PMH:  COPD, SOB, UTI, PE,  CAD, AVM, GERD, Hiatal hernia, back pain with implanted  stimulator. Pt. expresses a globus sensation since 9/13.   Esophagram revealed normal peristalsis brief hesitation of barium  pill at GE  junction.  She reported having EGD in 10/13 revealing  hiatal hernia but overall normal EGD.  Report also states MD did  dilate the esophagus at the GE junction..  Pt. has experienced  speech difficulty for several weeks with increase in dysphagia  symptoms.  Head CT negative (cannot have MRI).   MBS recommended  for full assessment of pharyngeal swallow function.     Assessment / Plan / Recommendation Clinical Impression  Dysphagia Diagnosis: Moderate oral phase dysphagia;Severe oral  phase dysphagia;Severe cervical esophageal phase dysphagia Clinical impression: Pt. exhibits moderate-severe pharyngeal and  severe cervical esophageal dysphagia.  Pharyngeal impairments  include decreased range of motion and strength with laryngeal  elevation, pharyngeal contraction and epiglottic deflection.   Thin liquid penetrated laryngeal vestibule during the swallow as  pt. attempted to compensate and prolong laryngeal elevation for  increased tracheal protection.  Barium exited vestibule during  the swallow.  Chin tuck and superglottic swallow technique were  not effective strategies to decrease or prevent penetration.  Pt.  also exhibited what appeared to be inadequate UES functioning  characterized by UES remaining patent after po's pass and with  solids/liquids ascending through the UES attempting to penetrate  laryngeal vestibule.  Pt. regurgitated/expectorated po's.  Full  liquids are recommended at this time.  SLP recommends GI consult  as well as neurology as pt. may have had a CVA.  SLP will see for  continued education and safety with recommended po's.    Treatment Recommendation  Therapy as outlined in treatment plan below    Diet  Recommendation Thin liquid (full liquids)   Liquid Administration via: Cup;No straw Medication Administration:  (liquid form if able,or crushed in  applesauce) Supervision: Intermittent supervision to cue for compensatory  strategies;Patient able to self feed Compensations: Slow rate;Small sips/bites;Check for anterior loss Postural Changes and/or Swallow Maneuvers: Seated upright 90  degrees;Upright 30-60 min after meal    Other  Recommendations Recommended Consults: MBS Oral Care Recommendations: Oral care BID  Follow Up Recommendations   (to be determined)    Frequency and Duration min 1 x/week  1 week       SLP Swallow Goals Goal #3: Pt. will demonstate swallow precautions to minimize  aspiration risks with modified independence.       Reason for Referral Objectively evaluate swallowing function   Oral Phase Oral Preparation/Oral Phase Oral Phase: WFL   Pharyngeal Phase Pharyngeal Phase Pharyngeal Phase: Impaired Pharyngeal - Thin Pharyngeal - Thin Cup: Reduced pharyngeal peristalsis;Reduced  anterior laryngeal mobility;Reduced laryngeal elevation;Reduced  airway/laryngeal closure;Reduced epiglottic  inversion;Penetration/Aspiration during swallow;Pharyngeal  residue - valleculae;Pharyngeal residue - pyriform sinuses Penetration/Aspiration details (thin cup): Material enters  airway, remains ABOVE vocal cords then ejected out Pharyngeal - Thin Straw: Reduced pharyngeal peristalsis;Reduced  epiglottic inversion;Reduced anterior laryngeal mobility;Reduced  laryngeal elevation;Reduced airway/laryngeal  closure;Penetration/Aspiration during swallow;Pharyngeal residue  - valleculae;Pharyngeal residue - pyriform sinuses Penetration/Aspiration details (thin straw): Material enters  airway, remains ABOVE vocal cords then ejected out Pharyngeal - Solids Pharyngeal - Puree: Reduced pharyngeal peristalsis;Reduced  epiglottic inversion;Reduced anterior laryngeal mobility;Reduced  laryngeal elevation;Reduced  airway/laryngeal closure;Pharyngeal  residue - valleculae;Pharyngeal residue - pyriform sinuses Pharyngeal - Mechanical Soft: Reduced pharyngeal  peristalsis;Reduced epiglottic inversion;Reduced anterior  laryngeal mobility;Reduced laryngeal elevation;Reduced  airway/laryngeal closure;Pharyngeal residue -  valleculae;Pharyngeal residue - pyriform sinuses  Cervical Esophageal Phase    GO    Cervical Esophageal Phase Cervical Esophageal Phase: Impaired Cervical Esophageal Phase - Pudding Pudding Teaspoon: Esophageal backflow into cervical  esophagus;Esophageal backflow into the pharynx Cervical Esophageal Phase - Thin Thin Cup: Esophageal backflow into cervical esophagus;Esophageal  backflow into the pharynx;Esophageal backflow into the larynx Thin Straw: Esophageal backflow into cervical  esophagus;Esophageal backflow into the pharynx;Esophageal  backflow into the larynx Cervical Esophageal Phase - Solids Mechanical Soft: Esophageal backflow into the pharynx;Esophageal  backflow into cervical esophagus         Breck Coons [place] M.Ed CCC-SLP Pager [phone]  07/29/2012      PHYSICAL EXAM   Patient is oriented to person time and place. Affect is normal. There is no jugular venous distention. Lungs are clear. Respiratory effort is nonlabored. Cardiac exam reveals S1 and S2. There no clicks or significant murmurs. The abdomen is soft. There is no peripheral edema.   TELEMETRY: I have reviewed telemetry today July 30, 2012. There is normal sinus rhythm.   ASSESSMENT AND PLAN:   Dysarthria  Dysphagia    The patient has been evaluated by neurology. The patient tells me that they will be arranging her followup neurologic outpatient studies. I last our team to be in contact with the neurology team to be sure that all of these things are in place before the patient goes home today.   Non-STEMI (non-ST elevated myocardial infarction)     The patient has had a cardiac event. We are thinking at this time that  it may have been a Takotsubo event. She is on appropriate medications. I will see her back for her cardiology followup.   Cardiomyopathy     The patient has mild decrease in left jugular function. We will follow this over time.  The plan will be for discharge later this morning. Before she goes we will be sure that neurology followup he is in place for early followup. We will also be sure the patient has an appointment with her primary care physician within the next 2 weeks. She can be seen in cardiology for followup in the 3-4 week range.   Willa Rough 07/30/2012 9:12 AM

## 2012-07-30 NOTE — Progress Notes (Signed)
Pt given d/c instructions; pt verbalized understanding; IV and tele monitor d/c at this time; will cont. To monitor. 

## 2012-07-30 NOTE — Discharge Summary (Signed)
Patient ID: [name],  MRN: [id number], DOB/AGE: Mar 11, 1941 71 y.o.  Admit date: 07/26/2012 Discharge date: 07/30/2012  Primary Care Provider: Cala Bradford Primary Cardiologist: Lovena Neighbours, MD  Discharge Diagnoses Principal Problem:  *Non-STEMI (non-ST elevated myocardial infarction)  **Nonobstructive CAD on cath with apical WMA suspicious for Takotsubo Cardiomyopathy. Active Problems:  Takotsubo cardiomyopathy  ** 45% to 50% with akinesis of the mid-distalanteroseptal myocardium by echo this admission.  Dysarthria  **Seen by neurology as inpatient with additional f/u pending as outpatient.  Dysphagia  **With abnl barium swallow this admission.  CAD (coronary artery disease)  **Nonobstructive by cath this admission.  HYPERTENSION  GASTROESOPHAGEAL REFLUX DISEASE  HIATAL HERNIA  Rheumatoid arthritis  COPD (chronic obstructive pulmonary disease)  Allergies Allergies  Allergen Reactions  . Levofloxacin Swelling  . Nitrofurantoin     *MACROBID*  Tongue swelling   Procedures  CTA of the Chest with Contrast 07/26/2012  IMPRESSION: No evidence pulmonary embolism. Dependent atelectasis in both lungs. Scattered atherosclerotic disease changes of the aorta and coronary arteries. Question splenomegaly, incompletely imaged. _____________  2D Echocardiogram 07/27/2012  Study Conclusions  Left ventricle: Systolic function was mildly reduced. The estimated ejection fraction was in the range of 45% to 50%. There is akinesis of the mid-distalanteroseptal myocardium. Doppler parameters are consistent with abnormal left ventricular relaxation (grade 1 diastolic dysfunction). _____________  Cardiac Catheterization 07/28/2012  Procedural Findings: Hemodynamics: AO 146/73 LV 147/22  Coronary angiography: Coronary dominance: right  Left mainstem: Separate ostia for the LAD and LCx Left anterior descending (LAD): Patent to the LV apex, but does not wrap around the  apex. Mild nonobstructive plaque in the proximal and mid vessel. Diagonals are patent. Mid LAD with 40-50% stenosis Left circumflex (LCx): Mild calcification. 40-50% proximal stenosis. OM branches patent. Right coronary artery (RCA): Very large, dominant vessel. PDA and PLA branches are widely patent. No obstructive disease throughout. Left ventriculography: Left ventricular systolic function is moderately depressed. The periapical region is akinetic. Basal segments are hyperdynamic. LVEF estimated at 40%  Final Conclusions:   1. Nonobstructive CAD 2. 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 Beta-blocker, med Rx, follow-up echo in 6-8 weeks. _____________  Modified Barium Swallow 07/29/2012  Clinical impression: Pt. exhibits moderate-severe pharyngeal and  severe cervical esophageal dysphagia.  Pharyngeal impairments  include decreased range of motion and strength with laryngeal  elevation, pharyngeal contraction and epiglottic deflection.   Thin liquid penetrated laryngeal vestibule during the swallow as  pt. attempted to compensate and prolong laryngeal elevation for  increased tracheal protection.  Barium exited vestibule during  the swallow.  Chin tuck and superglottic swallow technique were  not effective strategies to decrease or prevent penetration.  Pt.  also exhibited what appeared to be inadequate UES functioning  characterized by UES remaining patent after po's pass and with  solids/liquids ascending through the UES attempting to penetrate  laryngeal vestibule.  Pt. regurgitated/expectorated po's.  Full  liquids are recommended at this time. _____________  History of Present Illness  71 y/o female with the above complex problem list.  She was in her USOH until the day of admission when she had sudden onset of mid-sternal chest discomfort radiating to her jaw and left arm associated with dyspnea, diaphoresis, nausea,  and vomiting.  Symptoms were not relieved with asa or 3 sublingual nitroglycerin tablets @ home and her daughter subsequently drove her to [facility] ED for evaluation.  In the ED, she was treated with morphine, nitroglycerin, and toradol  with symptomatic relief.  CTA of the chest with contrast was performed and was negative for PE.  ECG showed new t wave abnormalities and troponin returned elevated at 0.71.  She was placed on heparin and admitted for further evaluation.  Hospital Course  Pt eventually peaked her troponin at 13.30.  2D echocardiogram was performed and showed an EF of 45-50% with akinesis of the mid-distalanteroseptal myocardium.  She was maintained on heparin and IV ntg over the weekend with plans for diagnostic catheterization on 12/16.  This was performed and revealed non-obstructive CAD with an EF of 40% and periapical akinesis.  It was felt that this was most consistent with a takotsubo cardiomyopathy and she was maintained on beta blocker and subsequently ARB therapy.  Volume status remained stable throughout her hospitalization and she had no further chest pain.  Ms. Cadenas did complain of dysphagia, which has been somewhat chronic but acutely worsened over a 2 week period prior to admission associated with slurring of speech.  A modified barium swallow was performed revealing significant swallowing abnormality and aspiration as outlined above.  Speech therapy has recommended a full, thin liquid diet.  She has f/u scheduled with GI in roughly 2 weeks and arrangements are being made for neurology follow-up through [facility].  Ms. Swann will be discharged home today in fair condition.  Discharge Vitals Blood pressure 108/41, pulse 70, temperature 97.7 F (36.5 C), temperature source Oral, resp. rate 18, height 5' 4.5" (1.638 m), weight 157 lb 10.1 oz (71.5 kg), SpO2 100.00%.  Filed Weights   07/28/12 0405 07/29/12 0011 07/29/12 1741  Weight: 156 lb 12 oz (71.1 kg) 156 lb  8.4 oz (71 kg) 157 lb 10.1 oz (71.5 kg)   Labs  CBC  Basename 07/30/12 0407 07/29/12 0435  WBC 7.8 7.7  NEUTROABS -- --  HGB 13.6 13.2  HCT 41.6 39.5  MCV 88.1 88.0  PLT 260 252   Basic Metabolic Panel  Basename 07/30/12 0407  NA 141  K 4.7  CL 106  CO2 26  GLUCOSE 89  BUN 10  CREATININE 0.79  CALCIUM 9.3  MG --  PHOS --   Cardiac Enzymes  Lab Results  Component Value Date   CKTOTAL 336* 07/30/2012   CKMB 116.3* 07/30/2012   TROPONINI 3.39* 07/27/2012    Hemoglobin A1C Lab Results  Component Value Date   HGBA1C 5.4 07/27/2012   Fasting Lipid Panel Lab Results  Component Value Date   CHOL 213* 07/27/2012   HDL 37* 07/27/2012   LDLCALC 123* 07/27/2012   TRIG 263* 07/27/2012   CHOLHDL 5.8 07/27/2012   Thyroid Function Tests Lab Results  Component Value Date   TSH 2.985 07/27/2012   ESR 2  Disposition  Pt is being discharged home today in fair condition.  Follow-up Plans & Appointments      Follow-up Information    Follow up with Tereso Newcomer, PA. On 08/12/2012. (8:50)    Contact information:   [address] [place] [address] [phone]       Follow up with [facility]. (You will be contacted by Neurology office to arrange follow-up.)    Contact information:   [address] [facility] [place] [address]  [phone]      Follow up with Cala Bradford, MD. In 2 weeks.   Contact information:   [address] [facility] [place] [address] [phone]       Follow up with Judie Petit T.  Russella Dar, MD,FACG. On 08/19/2012. (10:15 AM)    Contact information:   [address] [place] [address] [phone]        Discharge Medications    Medication List     As of 07/30/2012  2:33 PM    TAKE these medications         aspirin 81 MG tablet   Take 81 mg by mouth daily.      atorvastatin 80 MG tablet   Commonly known as: LIPITOR   Take 1 tablet (80 mg total) by mouth  daily at 6 PM.      cyanocobalamin 2000 MCG tablet   Take 2,000 mcg by mouth daily.      furosemide 40 MG tablet   Commonly known as: LASIX   Take 40 mg by mouth daily.      losartan 50 MG tablet   Commonly known as: COZAAR   Take 1 tablet (50 mg total) by mouth daily.      methylPREDNISolone 4 MG tablet   Commonly known as: MEDROL   Take 4 mg by mouth 3 (three) times daily.      metoprolol succinate 25 MG 24 hr tablet   Commonly known as: TOPROL-XL   Take 1 tablet (25 mg total) by mouth daily. Take with or immediately following a meal.      nitroGLYCERIN 0.4 MG SL tablet   Commonly known as: NITROSTAT   Place 1 tablet (0.4 mg total) under the tongue every 5 (five) minutes as needed for chest pain.      pantoprazole 40 MG tablet   Commonly known as: PROTONIX   Take 1 tablet (40 mg total) by mouth 2 (two) times daily.      potassium chloride 10 MEQ CR tablet   Commonly known as: KLOR-CON   Take 2 tablets (20 mEq total) by mouth daily.      ranitidine 150 MG capsule   Commonly known as: ZANTAC   Take 300 mg by mouth every evening.      trimethoprim 100 MG tablet   Commonly known as: TRIMPEX   Take 100 mg by mouth daily.      vitamin C 500 MG tablet   Commonly known as: ASCORBIC ACID   Take 1,000 mg by mouth daily.      Vitamin D 1000 UNITS capsule   Take 1,000 Units by mouth 2 (two) times daily.       Outstanding Labs/Studies  AchR-ab, anti-striatal antibody, anti-MUSK (drawn in hospital - results pending);  NCV/EMG (to be done as outpt).  Duration of Discharge Encounter   Greater than 30 minutes including physician time.  Signed, Nicolasa Ducking NP 07/30/2012, 2:33 PM  I've seen the patient today. See my complete progress note. The patient is ready to be discharged home. She will have early neurology followup concerning her swallowing difficulty. There are plans for her to see her primary physician. I will then see her for cardiology followup. I am in  agreement with all of the plan as mentioned above. I made the decision for her to be discharged.

## 2012-07-30 NOTE — Telephone Encounter (Signed)
This is a transitional care pt per Di Kindle

## 2012-07-31 NOTE — Telephone Encounter (Signed)
Busy signal

## 2012-07-31 NOTE — Telephone Encounter (Signed)
I called Mrs Ringel who states she is doing well but doesn't have a lot of energy back yet.  She does not need anything and is aware of her appt on 08/12/12 at 8:50 am with Tereso Newcomer.  I provided her with my name and phone number to call if she thinks of anything she needs.

## 2012-08-01 LAB — VITAMIN B1: Vitamin B1 (Thiamine): 7 nmol/L — ABNORMAL LOW (ref 8–30)

## 2012-08-02 LAB — STRIATED MUSCLE ANTIBODY: Striated Muscle Ab: <1:40 {titer}

## 2012-08-02 LAB — ACETYLCHOLINE RECEPTOR, BINDING: Acetylcholine Receptor Ab: <0.3 nmol/L (ref <=0.30)

## 2012-08-08 ENCOUNTER — Encounter: Payer: Self-pay | Admitting: Emergency Medicine

## 2012-08-08 ENCOUNTER — Ambulatory Visit (INDEPENDENT_AMBULATORY_CARE_PROVIDER_SITE_OTHER): Payer: Medicare Other | Admitting: Emergency Medicine

## 2012-08-08 VITALS — BP 108/66 | HR 64 | Temp 97.3°F | Ht 64.5 in | Wt 151.8 lb

## 2012-08-08 DIAGNOSIS — J849 Interstitial pulmonary disease, unspecified: Secondary | ICD-10-CM

## 2012-08-08 DIAGNOSIS — J8410 Pulmonary fibrosis, unspecified: Secondary | ICD-10-CM

## 2012-08-08 DIAGNOSIS — J449 Chronic obstructive pulmonary disease, unspecified: Secondary | ICD-10-CM

## 2012-08-08 NOTE — Assessment & Plan Note (Addendum)
Likely due to chronic aspiration, although she does have RA and has been on MTX in the past.  - agree with swallowing precautions, GI eval and workup - CT scan 12/14 is stable

## 2012-08-08 NOTE — Patient Instructions (Addendum)
Please practice your swallowing precautions to minimize getting choked by your food, drink and reflux.  Use albuterol 2 puffs if needed for shortness of breath Follow with Dr Delton Coombes in 12 months or sooner if you have any problems

## 2012-08-08 NOTE — Progress Notes (Signed)
Subjective:    Patient ID: [name], female    DOB: 07-26-1941, 71 y.o.   MRN: [id number] HPI 71 yo former smoker, hx carotid disease, RA diagnosed by Dr Nickola Major in June '11, started on MTX in 7/11.  No evidence CAD by myoview in 5/10. Also hx PE in 1988 after a bladder surgery, anti-coag x 57mo.  Referred by Dr Cliffton Asters to evaluate SOB. She tells me that her breathing had some minor limitations up to 2 yrs ago. This improved some when she was treated for Vit D and B12 deficiency. Then became much more bothersome Summer 2010 when she started Prednisone, followed by MTX for her RA. She has gained about 30 lbs since the Summer. She believes that the SOB relates to the initiation of these meds. She was started on Spiriva 12/11 ago - no difference in her breathing.   ROV 08/23/10 -- Hx tobacco, follows up for evaluation of SOB. We performed CBC that showed no anemia, CXR without evidence for ILD. Tells me that MTX was stopped last Friday, still on pred. Having some nasal congestion today. Her breathing is still difficult w exertion. We stopped Spiriva last time, she doesn't miss it. Not on any other BDs.   ROV 01/03/11 -- hx tobacco, RA on MTX. CAD being followed by Dr Myrtis Ser. L and R cath in 2/12 showed CAD, normal LV and RV pressures. Has restriction vs mixed disease - did not improve with Spiriva and albuterol. Has been off prednisone since January. She had a CT scan chest that showed very subtle subpleural interstitial changes.   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 Also mixed dz on PFT but no clinical response to Spiriva + SABA. Returns for f/u CT scan chest done 06/28/11. She tells me that she has been more SOB since last visit, was admitted to hosp 10/12 for dyspnea and chest pain, was felt to be volume overloaded and responded to diuretics - improved resp status. Her LV fxn has been normal by TTE, has diastolic dysfxn and A Fib as potential causes (?  Related to Embrel also - started 3-4 months ago). She remains on MTX. Her CT scan shows progression of sub-pleural fibrosis. Her MTX was increased last week - she isn't certain that the MTX has every helped her.   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 Also mixed dz on PFT but no clinical response to Spiriva + SABA. Her Embrel was stopped due to CHF exacerbation, but she is still on MTX. Her breathing hasn't really changed very much off the Embrel. She was temporarily off of MTX for three weeks, thought that her breathing was better. Her last CT scan 11/12 showed subpleural ILD, ? Related to MTX.   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 Also mixed dz on PFT but no clinical response to Spiriva + SABA. Her MTX was stopped by Dr Nickola Major 10/2011. She continues to have low energy. Breathing is about the same, still able to walk 2 miles every day. Last TTE was 2011.   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 Also mixed dz on PFT but no clinical response to Spiriva + SABA. Repeat TTE 4/24 showed estimated RVSP . She is still able to walk her two miles, but she has dyspnea with chores, sweeping,  etc. Her wt has gone up over the last year, probably 7-8 lbs.   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 Also mixed dz on PFT but no clinical response to Spiriva + SABA.  Returns after repeat CT scan of the chest to eval her ILD and nodules >> stable. She tells me today that her breathing has been stable to improved.  She was started on Pred for the last 2 weeks for joint pain - also seems to have made her breathing better.   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 Also mixed dz on PFT but no clinical response to Spiriva + SABA. Last time discussed retrying albuterol for  DOE. She tells me that since last visit she was admitted for NSTEMI. L cath done without intervention. 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   CT scan 07/26/12 --  Comparison: CT chest 01/03/2012  Findings:  Scattered atherosclerotic calcifications aorta and coronary  arteries.  No aortic aneurysm or dissection.  Scattered normal-sized mediastinal lymph nodes.  Spleen appears enlarged, 12.5 x 8.9 cm image 89, length not fully  imaged.  Pulmonary arteries patent.  No evidence pulmonary embolism.  Minimal dependent atelectasis in both lungs.  No acute infiltrate, pleural effusion or pneumothorax.  Osseous structures unremarkable.  IMPRESSION:  No evidence pulmonary embolism.  Dependent atelectasis in both lungs.  Scattered atherosclerotic disease changes of the aorta and coronary  arteries.  Question splenomegaly, incompletely imaged.   Review of Systems  as per HPI    Objective:  Physical Exam Filed Vitals:   08/08/12 1341  BP: 108/66  Pulse: 64  Temp: 97.3 F (36.3 C)   Gen: Pleasant, obese, in no distress,  normal affect  ENT: No lesions,  mouth clear,  oropharynx clear, no postnasal drip  Neck: No JVD, no TMG, no carotid bruits  Lungs: R > L basilar insp crackles, clear superiorly  Cardiovascular: RRR, heart sounds normal, no murmur or gallops, no LE edema  Musculoskeletal: No deformities, no cyanosis or clubbing  Neuro: alert, non focal  Skin: Warm, no lesions or rashes    Assessment & Plan:  Interstitial lung disease Likely due to chronic aspiration, although she does have RA and has been on MTX in the past.  - agree with swallowing precautions, GI eval and workup - CT scan 12/14 is stable  COPD (chronic obstructive pulmonary disease) She denies any dyspnea except for when she had the recent NSTEMI. She hasn't used albuterol

## 2012-08-08 NOTE — Assessment & Plan Note (Signed)
She denies any dyspnea except for when she had the recent NSTEMI. She hasn't used albuterol

## 2012-08-11 ENCOUNTER — Ambulatory Visit: Payer: Medicare Other | Admitting: Gastroenterology

## 2012-08-12 ENCOUNTER — Encounter: Payer: Self-pay | Admitting: Physician Assistant

## 2012-08-12 ENCOUNTER — Telehealth: Payer: Self-pay

## 2012-08-12 ENCOUNTER — Telehealth: Payer: Self-pay | Admitting: *Deleted

## 2012-08-12 ENCOUNTER — Ambulatory Visit (INDEPENDENT_AMBULATORY_CARE_PROVIDER_SITE_OTHER): Payer: Medicare Other | Admitting: Physician Assistant

## 2012-08-12 ENCOUNTER — Ambulatory Visit: Payer: Medicare Other

## 2012-08-12 VITALS — BP 110/68 | HR 64 | Ht 64.5 in | Wt 149.1 lb

## 2012-08-12 DIAGNOSIS — J449 Chronic obstructive pulmonary disease, unspecified: Secondary | ICD-10-CM

## 2012-08-12 DIAGNOSIS — J4489 Other specified chronic obstructive pulmonary disease: Secondary | ICD-10-CM

## 2012-08-12 DIAGNOSIS — I2510 Atherosclerotic heart disease of native coronary artery without angina pectoris: Secondary | ICD-10-CM

## 2012-08-12 DIAGNOSIS — R5381 Other malaise: Secondary | ICD-10-CM

## 2012-08-12 DIAGNOSIS — I10 Essential (primary) hypertension: Secondary | ICD-10-CM

## 2012-08-12 DIAGNOSIS — R0602 Shortness of breath: Secondary | ICD-10-CM

## 2012-08-12 DIAGNOSIS — R5383 Other fatigue: Secondary | ICD-10-CM

## 2012-08-12 DIAGNOSIS — I5181 Takotsubo syndrome: Secondary | ICD-10-CM

## 2012-08-12 DIAGNOSIS — E785 Hyperlipidemia, unspecified: Secondary | ICD-10-CM

## 2012-08-12 DIAGNOSIS — R05 Cough: Secondary | ICD-10-CM

## 2012-08-12 LAB — CBC WITH DIFFERENTIAL/PLATELET
Basophils Absolute: 0 10*3/uL (ref 0.0–0.1)
Lymphocytes Relative: 6.7 % — ABNORMAL LOW (ref 12.0–46.0)
Lymphs Abs: 0.8 10*3/uL (ref 0.7–4.0)
Monocytes Relative: 4.6 % (ref 3.0–12.0)
Neutrophils Relative %: 87.6 % — ABNORMAL HIGH (ref 43.0–77.0)
Platelets: 318 10*3/uL (ref 150.0–400.0)
RDW: 15.2 % — ABNORMAL HIGH (ref 11.5–14.6)
WBC: 11.6 10*3/uL — ABNORMAL HIGH (ref 4.5–10.5)

## 2012-08-12 LAB — HEPATIC FUNCTION PANEL
ALT: 56 U/L — ABNORMAL HIGH (ref 0–35)
AST: 51 U/L — ABNORMAL HIGH (ref 0–37)
Albumin: 4 g/dL (ref 3.5–5.2)
Albumin: 4 g/dL (ref 3.5–5.2)
Alkaline Phosphatase: 64 U/L (ref 39–117)
Alkaline Phosphatase: 62 U/L (ref 39–117)
Bilirubin, Direct: 0 mg/dL (ref 0.0–0.3)
Total Bilirubin: 0.9 mg/dL (ref 0.3–1.2)

## 2012-08-12 LAB — BASIC METABOLIC PANEL
BUN: 15 mg/dL (ref 6–23)
Calcium: 8.9 mg/dL (ref 8.4–10.5)
GFR: 59.71 mL/min — ABNORMAL LOW (ref >60.00)
Glucose, Bld: 92 mg/dL (ref 70–99)

## 2012-08-12 LAB — BRAIN NATRIURETIC PEPTIDE: Pro B Natriuretic peptide (BNP): 166 pg/mL — ABNORMAL HIGH (ref 0.0–100.0)

## 2012-08-12 NOTE — Telephone Encounter (Signed)
Notified patient that we need repeat hepatic function panel before her appt scheduled for Tuesday. Pt agreed to have lab done and will come by today for blood test.

## 2012-08-12 NOTE — Patient Instructions (Addendum)
Today Labs (BMET, CBC, BNP)   You have been referred to Cardiac Rehab to be done at [facility]  Schedule appointment with Dr. Myrtis Ser in 6 Weeks

## 2012-08-12 NOTE — Progress Notes (Signed)
[address] [place], [place]  [address] Phone: [phone], Fax:  [phone]  Date:  08/12/2012   Name:  [name]   DOB:  25-Oct-1940   MRN:  [id number]  PCP:  Cala Bradford, MD  Primary Cardiologist:  Dr. Zackery Barefoot  Primary Electrophysiologist:  None    History of Present Illness: [name] is a 71 y.o. female who returns for follow up after recent admission to the [name] for NSTEMI.  She has a h/o remote pulmonary embolism, carotid stenosis, HTN, COPD, GERD, rheumatoid arthritis and irritable bowel syndrome. LHC in 2002 with oCFX 25% and otherwise normal coronary arteries. Myoview 5/10: no ischemia and an EF of 50%.  Echo 4/11:  EF of 55-60%; Gr 1 diastolic dysfunction; mild LAE.  Pulmonary function tests in 2012 with no significant abnormality.  Repeat LHC 09/30/10:  40% prox CFX and 40% dist. LAD. Her EF was normal and her left and right heart filling pressures are not significantly elevated.  Last seen by Dr. Myrtis Ser 4/13.  She was admitted 12/14-12/18. She presented with sudden onset of midsternal chest discomfort radiating to her jaw and left arm with associated dyspnea, diaphoresis, nausea and vomiting. Chest CT was negative for pulmonary embolism. Cardiac markers were abnormal ruling her in for a non-STEMI. Echo 07/27/12: EF 45-50%, mid to distal anteroseptal AK, grade 1 diastolic dysfunction. LHC 07/28/12: mLAD 40-50%, pCFX 40-50%, RCA ok, EF 40% with periapical AK. 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 Beta blocker and ARB therapy was continued. Patient had problem with dysphagia. She was seen by neurology as well as speech therapy. Modified barium swallow demonstrated significant swallowing abnormality and aspiration.  She has followup planned with [facility].  Head CT was negative.  She also has f/u pending with GI.    Since d/c, she is c/o fatigue and weakness.  Notes DOE that is chronic.   Symptoms may be a little worse last few days.  NYHA Class III.  No orthopnea, PND, edema.  No syncope.  Notes substernal chest pain few days ago.  Pain lasted about 30 mins.  Not like pain with MI.  Took ASA.  No NTG.  No recurrence.     Labs (12/13):   K 4.7, creatinine 0.79, Mg 1.9, LDL 123, Hgb 13.6, TSH 2.985  Wt Readings from Last 3 Encounters:  08/12/12 149 lb 1.9 oz (67.64 kg)  08/08/12 151 lb 12.8 oz (68.856 kg)  07/29/12 157 lb 10.1 oz (71.5 kg)     Past Medical History  Diagnosis Date  . Pulmonary embolism     a. 07/2012 CTA chest negative for PE.  Marland Kitchen Rheumatoid arthritis   . Hypertension   . Incontinence of feces   . Unspecified disorder of bladder   . Rectal prolapse   . Hiatal hernia   . IBS (irritable bowel syndrome)   . GERD (gastroesophageal reflux disease)   . Pain in limb   . Lumbago     [name] with back pain in April, 2011 repair of a disc herniation L4-L5   . Osteoarthrosis, unspecified whether generalized or localized, unspecified site   . COPD (chronic obstructive pulmonary disease)   . Tobacco use disorder   . CAD (coronary artery disease) Non-obstructive    a. Nuclear, May, 2010, no ischemia;  b. cath 2012, nonobstructive CAD ( 40% circumflex, 40% LAD) EF normal;  c. 07/2012 NSTEMI/Cath: nonobs dzs, EF 40% w/ periapical AK.  . Carotid  artery disease     a. Doppler, July, 2010, 40-59% bilateral, b. Dopplers 6/13:  40-59% bilat (f/u  01/2014)  . Adenomatous polyp 02/2008  . AVM (arteriovenous malformation)     Small colonic  . Shortness of breath     Cardiac evaluation, Dr.Katz, pulmonary evaluation, 2012, Dr. Delton Coombes.  Marland Kitchen UTI (lower urinary tract infection)     Possible UTI by history June 14, 2011,   . Gout   . Shoulder pain, right   . Spinal stenosis   . Hyperlipidemia   . Glaucoma   . Splenomegaly   . Fatty infiltration of liver   . Cystitis   . Takotsubo cardiomyopathy Presumed    a. 07/2012 NSTEMI/Cath (nonobs dzs)/Echo:EF 45-50%,  mid-distal antsept AK w/ Gr 1 DD.  Marland Kitchen Dysphagia     a. s/p prior esophageal dilatation.    Current Outpatient Prescriptions  Medication Sig Dispense Refill  . Ascorbic Acid (VITAMIN C) 500 MG tablet Take 1,000 mg by mouth daily.       Marland Kitchen aspirin 81 MG tablet Take 81 mg by mouth daily.        Marland Kitchen atorvastatin (LIPITOR) 80 MG tablet Take 1 tablet (80 mg total) by mouth daily at 6 PM.  30 tablet  6  . Cholecalciferol (VITAMIN D) 1000 UNITS capsule Take 1,000 Units by mouth 2 (two) times daily.        . cyanocobalamin 2000 MCG tablet Take 2,000 mcg by mouth daily.        . furosemide (LASIX) 40 MG tablet Take 40 mg by mouth daily.      Marland Kitchen losartan (COZAAR) 50 MG tablet Take 1 tablet (50 mg total) by mouth daily.  30 tablet  6  . methylPREDNISolone (MEDROL) 4 MG tablet Take 4 mg by mouth 2 (two) times daily.       . metoprolol succinate (TOPROL XL) 25 MG 24 hr tablet Take 1 tablet (25 mg total) by mouth daily. Take with or immediately following a meal.  30 tablet  6  . nitroGLYCERIN (NITROSTAT) 0.4 MG SL tablet Place 1 tablet (0.4 mg total) under the tongue every 5 (five) minutes as needed for chest pain.  25 tablet  6  . pantoprazole (PROTONIX) 40 MG tablet Take 1 tablet (40 mg total) by mouth 2 (two) times daily.  60 tablet  11  . potassium chloride (KLOR-CON) 10 MEQ CR tablet Take 2 tablets (20 mEq total) by mouth daily.  60 tablet  3  . ranitidine (ZANTAC) 150 MG capsule Take 300 mg by mouth every evening.       . trimethoprim (TRIMPEX) 100 MG tablet Take 100 mg by mouth daily.      Marland Kitchen PROAIR HFA 108 (90 BASE) MCG/ACT inhaler Inhale 2 puffs into the lungs as needed.       . [DISCONTINUED] Etanercept (ENBREL Grand) Inject 1 mg into the skin. Once a week         Allergies: Allergies  Allergen Reactions  . Levofloxacin Swelling  . Nitrofurantoin     *MACROBID*  Tongue swelling    Social History:  The patient  reports that she quit smoking about 5 years ago. She has never used smokeless tobacco.  She reports that she does not drink alcohol or use illicit drugs.   ROS:  Please see the history of present illness.   Has had some chills.  All other systems reviewed and negative.   PHYSICAL EXAM: VS:  BP 110/68  Pulse  64  Ht 5' 4.5" (1.638 m)  Wt 149 lb 1.9 oz (67.64 kg)  BMI 25.20 kg/m2 Well nourished, well developed, in no acute distress HEENT: normal Neck: no JVD Cardiac:  normal S1, S2; RRR; no murmur Lungs:  clear to auscultation bilaterally, no wheezing, rhonchi or rales Abd: soft, nontender, no hepatomegaly Ext: no edema; right wrist without hematoma or bruit  Skin: warm and dry Neuro:  CNs 2-12 intact, no focal abnormalities noted  EKG:  NSR, HR 64, leftward axis, TWI 1, 2, aVF, V2-6, no change from prior tracing      ASSESSMENT AND PLAN:  1. Tako-Tsubo CM:   Patient somewhat weak since presenting with NSTEMI related to Tako-Tsubo CM.  This may in part be related to beta blocker Rx.  She has interstitial lung dz and is followed by Dr. Delton Coombes.  She is not wheezing on exam.  May consider changing to bisoprolol if symptoms continue.  Continue current Rx for now.  Check CBC, BMET, BNP.  If BNP elevated, adjust lasix.  She will likely need a repeat echo in about 3 mos.   2. Coronary Artery Disease:  Continue ASA and statin.    3. COPD:  Management per Pulmonary.  4. Dysarthria/Dysphagia:  She has f/u pending with GI and neuro.  5. Hypertension:  Controlled.  Continue current therapy.   6. Hyperlipidemia:  Continue high dose statin.  Arrange f/u lipids and LFTs at next visit.    7. Disposition:   F/u with Dr. Zackery Barefoot in 6 weeks.    Signed, Tereso Newcomer, PA-C  9:25 AM 08/12/2012

## 2012-08-12 NOTE — Telephone Encounter (Signed)
pt notified about lab results and states she has had a cough, I will order a cxr and bmet, bnp to be done 1/7 when she sees Dr. Russella Dar, pt verbalized understanding

## 2012-08-12 NOTE — Telephone Encounter (Signed)
Message copied by Tarri Fuller on Tue Aug 12, 2012  6:11 PM ------      Message from: [name], [place] T      Created: Tue Aug 12, 2012  4:44 PM       BNP just minimally elevated         -  Increase Lasix to 60 mg QD for 3 days, then resume 40 mg QD         -  Check BMET and BNP in 1 week.      K+ and creatinine ok      WBC high.  May be from prednisone.         -  Ask if she has had cough, fever, dysuria         -  If cough or fever => send for CXR (PA, Lat)         -  If fever or dysuria => get u/a and culture      Send labs to PCP.      Tereso Newcomer, PA-C  4:44 PM 08/12/2012

## 2012-08-15 ENCOUNTER — Other Ambulatory Visit: Payer: Self-pay

## 2012-08-15 NOTE — Progress Notes (Signed)
[address] [place], [place]  1:51 [place] 08/15/2012

## 2012-08-18 ENCOUNTER — Telehealth: Payer: Self-pay | Admitting: *Deleted

## 2012-08-18 ENCOUNTER — Other Ambulatory Visit (INDEPENDENT_AMBULATORY_CARE_PROVIDER_SITE_OTHER): Payer: Medicare Other

## 2012-08-18 ENCOUNTER — Ambulatory Visit (INDEPENDENT_AMBULATORY_CARE_PROVIDER_SITE_OTHER)
Admission: RE | Admit: 2012-08-18 | Discharge: 2012-08-18 | Disposition: A | Discharge status: Home / Self Care | Payer: Medicare Other | Source: Ambulatory Visit | Attending: Physician Assistant | Admitting: Physician Assistant

## 2012-08-18 DIAGNOSIS — R0602 Shortness of breath: Secondary | ICD-10-CM

## 2012-08-18 DIAGNOSIS — R05 Cough: Secondary | ICD-10-CM

## 2012-08-18 DIAGNOSIS — I10 Essential (primary) hypertension: Secondary | ICD-10-CM

## 2012-08-18 DIAGNOSIS — R059 Cough, unspecified: Secondary | ICD-10-CM

## 2012-08-18 LAB — BASIC METABOLIC PANEL
CO2: 29 mEq/L (ref 19–32)
Calcium: 9 mg/dL (ref 8.4–10.5)
Creatinine, Ser: 1 mg/dL (ref 0.4–1.2)

## 2012-08-18 NOTE — Telephone Encounter (Signed)
called pt to see if she was going to get cxr, bmet, bnp today as in previous phone note. pt said yes.

## 2012-08-18 NOTE — Telephone Encounter (Signed)
Message copied by Tarri Fuller on Mon Aug 18, 2012  5:33 PM ------      Message from: [place], [place] T      Created: Mon Aug 18, 2012  3:23 PM       chest X-ray is ok without acute findings.      Tereso Newcomer, PA-C  3:19 PM 08/18/2012

## 2012-08-18 NOTE — Telephone Encounter (Signed)
pt notified about lab/cxr results with verbal understanding today

## 2012-08-18 NOTE — Telephone Encounter (Signed)
Message copied by Tarri Fuller on Mon Aug 18, 2012  9:57 AM ------      Message from: [place], [place] T      Created: Tue Aug 12, 2012  7:36 PM       I want her to get CXR 08/14/12 (Thursday)      Tereso Newcomer, PA-C  7:36 PM 08/12/2012

## 2012-08-19 ENCOUNTER — Ambulatory Visit: Payer: Medicare Other | Admitting: Gastroenterology

## 2012-09-01 ENCOUNTER — Encounter: Payer: Self-pay | Admitting: Gastroenterology

## 2012-09-01 ENCOUNTER — Ambulatory Visit (INDEPENDENT_AMBULATORY_CARE_PROVIDER_SITE_OTHER): Payer: Medicare Other | Admitting: Gastroenterology

## 2012-09-01 VITALS — BP 100/52 | HR 72 | Ht 64.5 in | Wt 148.8 lb

## 2012-09-01 DIAGNOSIS — R1312 Dysphagia, oropharyngeal phase: Secondary | ICD-10-CM

## 2012-09-01 DIAGNOSIS — K644 Residual hemorrhoidal skin tags: Secondary | ICD-10-CM

## 2012-09-01 MED ORDER — HYDROCORTISONE ACE-PRAMOXINE 2.5-1 % RE CREA: TOPICAL_CREAM | Freq: Two times a day (BID) | RECTAL | Status: DC

## 2012-09-01 NOTE — Patient Instructions (Addendum)
We have sent the following medications to your pharmacy for you to pick up at your convenience: Analpram.  You have been given rectal care instructions and hemorrhoid information.   Thank you for choosing me and [facility].  Venita Lick. Pleas Koch., MD., Clementeen Graham  cc: Laurann [facility], MD     Zenovia Jordan, MD

## 2012-09-01 NOTE — Progress Notes (Signed)
History of Present Illness: This is a 72 year old female with mildly elevated transaminases and fatty infiltration of the liver noted on ultrasound. She had a significantly elevated ANA at a titer of 1:640. She has a history of dysphagia. A barium esophagram and upper endoscopy performed both in October 2013. There was a questionable hold up above the barium tablet at the esophagogastric junction however no stricture was noted upper endoscopy. She's been recently hospitalized for a non-STEMI and Takotsubo cardiomyopathy. During that hospitalization she complained of acute onset of solid and liquid dysphagia and dysarthria. She was evaluated by neurology and SLP. She complains of mild loose stools for the past 3-4 days and a flare of external hemorrhoids with discomfort and swelling.  MBS: Pt. exhibits moderate-severe pharyngeal and severe cervical esophageal dysphagia. Pharyngeal impairments include decreased range of motion and strength with laryngeal elevation, pharyngeal contraction and epiglottic deflection. Thin liquid penetrated laryngeal vestibule during the swallow as pt. attempted to compensate and prolong laryngeal elevation for increased tracheal protection. Barium exited vestibule during the swallow. Chin tuck and superglottic swallow technique were not effective strategies to decrease or prevent penetration. Pt. also exhibited what appeared to be inadequate UES functioning characterized by UES remaining patent after po's pass and with solids/liquids ascending through the UES attempting to penetrate laryngeal vestibule. Pt. regurgitated/expectorated po's. Full liquids are recommended at this time   Current Medications, Allergies, Past Medical History, Past Surgical History, Family History and Social History were reviewed in [employer] record.  Physical Exam: General: Well developed , well nourished, no acute distress Head: Normocephalic and atraumatic Eyes:  sclerae  anicteric, EOMI Ears: Normal auditory acuity Mouth: No deformity or lesions Lungs: Clear throughout to auscultation Heart: Regular rate and rhythm; no murmurs, rubs or bruits Abdomen: Soft, non tender and non distended. No masses, hepatosplenomegaly or hernias noted. Normal Bowel sounds Musculoskeletal: Symmetrical with no gross deformities  Pulses:  Normal pulses noted Extremities: No clubbing, cyanosis, edema or deformities noted Neurological: Alert oriented x 4, grossly nonfocal Psychological:  Alert and cooperative. Normal mood and affect  Assessment and Recommendations:  1. Dysphagia and dysarthria. Moderate to severe oropharyngeal dysphasia on MBS. Neurology followup evaluation is planned. She underwent an MRI of the brain and she is scheduled to undergo EMG testing. Rule out myositis. Rheumatology followup evaluation is planned as well.  2. Mildly elevated transaminases. Fatty infiltration of the liver is the most likely cause. Does not appear she has autoimmune hepatitis however a liver biopsy would be needed to further evaluate. Decision on liver biopsy will be deferred until she has made full recovery from her MI and Takotsubo cardiomyopathy.  3. Status post recent non-STEMI and Takotsubo cardiomyopathy. Cardiology followup is planned.  4. Mild diarrhea and uncomfortable external hemorrhoids. Standard rectal care instructions and Analpram cream.

## 2012-09-03 ENCOUNTER — Telehealth: Payer: Self-pay | Admitting: Gastroenterology

## 2012-09-03 NOTE — Telephone Encounter (Signed)
Patient notified of Dr. Stark's recommendations.  

## 2012-09-03 NOTE — Telephone Encounter (Signed)
She can try an OTC hemorrhoidal cream with hydrocortisone

## 2012-09-03 NOTE — Telephone Encounter (Signed)
Patient states the Analpram is too expensive and when trying to prescribe any other rectal cream her insurance will not cover them. We do not have any samples of a rectal creams and she states she has tried over the counter ones. Do you want to tell her to continue Sitz baths and rectal care instructions?

## 2012-09-04 ENCOUNTER — Ambulatory Visit (HOSPITAL_COMMUNITY): Payer: Medicare Other

## 2012-09-04 ENCOUNTER — Encounter: Payer: Self-pay | Admitting: Cardiology

## 2012-09-04 NOTE — Progress Notes (Signed)
   The patient is being evaluated by neurology for elevated CPK. She had EMG study. This was done September 02, 2012. The findings are abnormal and consistent with an underlying myopathy or myositis. Plans are being made to proceed with muscle biopsy. Dr. Marjory Lies sent records to [place] and they will be scanned.

## 2012-09-08 ENCOUNTER — Ambulatory Visit (HOSPITAL_COMMUNITY): Payer: Medicare Other

## 2012-09-09 ENCOUNTER — Other Ambulatory Visit: Payer: Self-pay | Admitting: Neurosurgery

## 2012-09-09 ENCOUNTER — Encounter (HOSPITAL_COMMUNITY): Payer: Self-pay | Admitting: Pharmacy Technician

## 2012-09-10 ENCOUNTER — Ambulatory Visit (HOSPITAL_COMMUNITY): Payer: Medicare Other

## 2012-09-12 ENCOUNTER — Ambulatory Visit (HOSPITAL_COMMUNITY): Payer: Medicare Other

## 2012-09-12 NOTE — Pre-Procedure Instructions (Signed)
[name]  09/12/2012   Your procedure is scheduled on:  Wednesday September 17, 2012.  Report to [facility] at 6:00 AM.  Call this number if you have problems the morning of surgery: [phone]   Remember:   Do not eat food or drink liquids after midnight.   Take these medicines the morning of surgery with A SIP OF WATER: Hydrocodone (Vicodin) if needed for pain, Metoprolol (Toprol XL), Pantoprazole (Protonix), Proair if needed for shortness of breath.  Discontinue aspirin and herbal medications 7 days prior to surgery   Do not wear jewelry, make-up or nail polish.  Do not wear lotions, powders, or perfumes. You may NOT wear deodorant.  Do not shave 48 hours prior to surgery.   Do not bring valuables to the hospital.  Contacts, dentures or bridgework may not be worn into surgery.  Leave suitcase in the car. After surgery it may be brought to your room.  For patients admitted to the hospital, checkout time is 11:00 AM the day of discharge.   Patients discharged the day of surgery will not be allowed to drive home.  Name and phone number of your driver:   Special Instructions: Shower using CHG 2 nights before surgery and the night before surgery.  If you shower the day of surgery use CHG.  Use special wash - you have one bottle of CHG for all showers.  You should use approximately 1/3 of the bottle for each shower.   Please read over the following fact sheets that you were given: Pain Booklet, Coughing and Deep Breathing, MRSA Information and Surgical Site Infection Prevention

## 2012-09-15 ENCOUNTER — Encounter (HOSPITAL_COMMUNITY): Payer: Self-pay

## 2012-09-15 ENCOUNTER — Encounter (HOSPITAL_COMMUNITY)
Admission: RE | Admit: 2012-09-15 | Discharge: 2012-09-15 | Disposition: A | Discharge status: Home / Self Care | Payer: Medicare Other | Source: Ambulatory Visit | Attending: Neurosurgery | Admitting: Neurosurgery

## 2012-09-15 ENCOUNTER — Ambulatory Visit (HOSPITAL_COMMUNITY): Payer: Medicare Other

## 2012-09-15 HISTORY — DX: Pneumonia, unspecified organism: J18.9

## 2012-09-15 HISTORY — DX: Heart failure, unspecified: I50.9

## 2012-09-15 LAB — BASIC METABOLIC PANEL
Calcium: 9.1 mg/dL (ref 8.4–10.5)
GFR calc non Af Amer: 67 mL/min — ABNORMAL LOW (ref >90)
Sodium: 139 mEq/L (ref 135–145)

## 2012-09-15 LAB — CBC
Platelets: 224 10*3/uL (ref 150–400)
RBC: 4.53 MIL/uL (ref 3.87–5.11)
RDW: 15.9 % — ABNORMAL HIGH (ref 11.5–15.5)
WBC: 8.6 10*3/uL (ref 4.0–10.5)

## 2012-09-15 LAB — SURGICAL PCR SCREEN
MRSA, PCR: NEGATIVE
Staphylococcus aureus: NEGATIVE

## 2012-09-15 NOTE — Progress Notes (Signed)
Pt had a cardiac cath on 07/28/12 no PCI. Results in EPIC. PCP is Dr. Laurann Montana. Neurologist is Dr. Marjory Lies. Patient denied having a sleep study.

## 2012-09-16 MED ORDER — CEFAZOLIN SODIUM-DEXTROSE 2-3 GM-% IV SOLR
2.0000 g | INTRAVENOUS | Status: AC
  Administered 2012-09-17: 2 g via INTRAVENOUS
  Filled 2012-09-16: qty 50

## 2012-09-16 NOTE — Consult Note (Signed)
Anesthesia Chart Review:  Patient is a 72 year old female scheduled for right deltoid muscle biopsy for work-up for elevated CPK and abnormal EMG. Procedure is scheduled for 09/17/12 with Dr. Franky Macho.  It is scheduled to be done under [facility].  History includes former smoker, RA (Dr. 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 Delton Coombes (last visit 08/08/12).  She was hospitalized on 07/26/12 - 07/30/12 for NSTEMI related to Takotsubo cardiomyopathy. PCP is Dr. Laurann Montana.  Neurologist is Dr. Marjory Lies.  GI is Dr. Russella Dar. Cardiologist is Dr. Myrtis Ser who is aware of this procedure.    His last EKG at [facility] on 08/12/12 showed NSR, LAD, anterolateral T wave abnormality, consider ischemia, prolonged QT.  It was not felt significantly changed from her prior tracing.  Cardiac cath on 07/28/12 showed: 1. Nonobstructive CAD (mid LAD 40-50%, proximal LCX 40-50%, widely patent RCA).  2. Moderate segmental LV dysfunction with periapical akinesis, EF 40%.   Echo on 07/27/12 showed: Left ventricle: Systolic function was mildly reduced. The estimated ejection fraction was in the range of 45% to 50%. There is akinesis of the mid-distalanteroseptal myocardium. Doppler parameters are consistent with abnormal left ventricular relaxation (grade 1 diastolic dysfunction).  CXR on 08/18/12 showed: Stable chest x-ray with borderline cardiomegaly. No active lung disease.  Pre-operative labs noted.    She is being closed followed by cardiology for her recent NSTEMI in the setting of Takotsubo CM.  She is also undergoing GI and Neurology evaluation for dysphagia, dysarthria, myopathy.  By notes, she has ILD felt likley due to chronic aspiration and is currently following swallowing precautions.  She is likely an  higher aspiration risk with anesthesia.  She will be evaluated by her anesthesiologist on the day of surgery to discuss the definitive anesthesia plan.  Shonna Chock, PA-C 09/16/12 1202

## 2012-09-17 ENCOUNTER — Ambulatory Visit (HOSPITAL_COMMUNITY): Payer: Medicare Other

## 2012-09-17 ENCOUNTER — Encounter (HOSPITAL_COMMUNITY)
Admission: RE | Disposition: A | Discharge status: Home / Self Care | Payer: Self-pay | Source: Ambulatory Visit | Attending: Neurosurgery

## 2012-09-17 ENCOUNTER — Ambulatory Visit (HOSPITAL_COMMUNITY)
Admission: RE | Admit: 2012-09-17 | Discharge: 2012-09-17 | Disposition: A | Discharge status: Home / Self Care | Payer: Medicare Other | Source: Ambulatory Visit | Attending: Neurosurgery | Admitting: Neurosurgery

## 2012-09-17 ENCOUNTER — Encounter (HOSPITAL_COMMUNITY): Payer: Self-pay | Admitting: Vascular Surgery

## 2012-09-17 ENCOUNTER — Encounter (HOSPITAL_COMMUNITY): Payer: Self-pay | Admitting: Anesthesiology

## 2012-09-17 ENCOUNTER — Ambulatory Visit (HOSPITAL_COMMUNITY): Payer: Medicare Other | Admitting: Anesthesiology

## 2012-09-17 DIAGNOSIS — R5381 Other malaise: Secondary | ICD-10-CM | POA: Insufficient documentation

## 2012-09-17 DIAGNOSIS — G7241 Inclusion body myositis [IBM]: Secondary | ICD-10-CM | POA: Insufficient documentation

## 2012-09-17 DIAGNOSIS — I509 Heart failure, unspecified: Secondary | ICD-10-CM | POA: Insufficient documentation

## 2012-09-17 DIAGNOSIS — I10 Essential (primary) hypertension: Secondary | ICD-10-CM | POA: Insufficient documentation

## 2012-09-17 DIAGNOSIS — J4489 Other specified chronic obstructive pulmonary disease: Secondary | ICD-10-CM | POA: Insufficient documentation

## 2012-09-17 DIAGNOSIS — J449 Chronic obstructive pulmonary disease, unspecified: Secondary | ICD-10-CM | POA: Insufficient documentation

## 2012-09-17 HISTORY — PX: MUSCLE BIOPSY: SHX716

## 2012-09-17 SURGERY — BIOPSY, MUSCLE, GASTROCNEMIUS
Anesthesia: General | Site: Shoulder | Laterality: Right | Wound class: Clean

## 2012-09-17 MED ORDER — GLYCOPYRROLATE 0.2 MG/ML IJ SOLN
INTRAMUSCULAR | Status: DC | PRN
  Administered 2012-09-17: .4 mg via INTRAVENOUS

## 2012-09-17 MED ORDER — HYDROCODONE-ACETAMINOPHEN 5-325 MG PO TABS: 1.0000 | ORAL_TABLET | Freq: Four times a day (QID) | ORAL | Status: DC | PRN

## 2012-09-17 MED ORDER — OXYCODONE HCL 5 MG PO TABS: 5.0000 mg | ORAL_TABLET | Freq: Once | ORAL | Status: DC | PRN

## 2012-09-17 MED ORDER — OXYCODONE HCL 5 MG/5ML PO SOLN: 5.0000 mg | Freq: Once | ORAL | Status: DC | PRN

## 2012-09-17 MED ORDER — FENTANYL CITRATE 0.05 MG/ML IJ SOLN
INTRAMUSCULAR | Status: DC | PRN
  Administered 2012-09-17: 50 ug via INTRAVENOUS

## 2012-09-17 MED ORDER — ONDANSETRON HCL 4 MG/2ML IJ SOLN
INTRAMUSCULAR | Status: DC | PRN
  Administered 2012-09-17: 4 mg via INTRAVENOUS

## 2012-09-17 MED ORDER — FENTANYL CITRATE 0.05 MG/ML IJ SOLN
INTRAMUSCULAR | Status: AC
  Filled 2012-09-17: qty 2

## 2012-09-17 MED ORDER — 0.9 % SODIUM CHLORIDE (POUR BTL) OPTIME
TOPICAL | Status: DC | PRN
  Administered 2012-09-17: 1000 mL

## 2012-09-17 MED ORDER — METOCLOPRAMIDE HCL 5 MG/ML IJ SOLN: 10.0000 mg | Freq: Once | INTRAMUSCULAR | Status: DC | PRN

## 2012-09-17 MED ORDER — PHENYLEPHRINE HCL 10 MG/ML IJ SOLN
INTRAMUSCULAR | Status: DC | PRN
  Administered 2012-09-17: 80 ug via INTRAVENOUS
  Administered 2012-09-17: 40 ug via INTRAVENOUS

## 2012-09-17 MED ORDER — LIDOCAINE HCL (CARDIAC) 20 MG/ML IV SOLN
INTRAVENOUS | Status: DC | PRN
  Administered 2012-09-17: 100 mg via INTRAVENOUS

## 2012-09-17 MED ORDER — NEOSTIGMINE METHYLSULFATE 1 MG/ML IJ SOLN
INTRAMUSCULAR | Status: DC | PRN
  Administered 2012-09-17: 2 mg via INTRAVENOUS

## 2012-09-17 MED ORDER — PROPOFOL 10 MG/ML IV BOLUS
INTRAVENOUS | Status: DC | PRN
  Administered 2012-09-17: 180 mg via INTRAVENOUS

## 2012-09-17 MED ORDER — FENTANYL CITRATE 0.05 MG/ML IJ SOLN
25.0000 ug | INTRAMUSCULAR | Status: DC | PRN
  Administered 2012-09-17 (×2): 50 ug via INTRAVENOUS

## 2012-09-17 MED ORDER — LACTATED RINGERS IV SOLN
INTRAVENOUS | Status: DC | PRN
  Administered 2012-09-17: 09:00:00 via INTRAVENOUS

## 2012-09-17 MED ORDER — LIDOCAINE-EPINEPHRINE 0.5 %-1:200000 IJ SOLN
INTRAMUSCULAR | Status: DC | PRN
  Administered 2012-09-17: 10 mL via INTRADERMAL

## 2012-09-17 MED ORDER — MIDAZOLAM HCL 5 MG/5ML IJ SOLN
INTRAMUSCULAR | Status: DC | PRN
  Administered 2012-09-17: 1 mg via INTRAVENOUS

## 2012-09-17 MED ORDER — ROCURONIUM BROMIDE 100 MG/10ML IV SOLN
INTRAVENOUS | Status: DC | PRN
  Administered 2012-09-17: 25 mg via INTRAVENOUS

## 2012-09-17 SURGICAL SUPPLY — 71 items
ADH SKN CLS APL DERMABOND .7 (GAUZE/BANDAGES/DRESSINGS) ×1
APL SKNCLS STERI-STRIP NONHPOA (GAUZE/BANDAGES/DRESSINGS) ×1
BANDAGE ELASTIC 4 VELCRO ST LF (GAUZE/BANDAGES/DRESSINGS) IMPLANT
BANDAGE ELASTIC 6 VELCRO ST LF (GAUZE/BANDAGES/DRESSINGS) IMPLANT
BANDAGE GAUZE ELAST BULKY 4 IN (GAUZE/BANDAGES/DRESSINGS) ×2 IMPLANT
BENZOIN TINCTURE PRP APPL 2/3 (GAUZE/BANDAGES/DRESSINGS) ×2 IMPLANT
BLADE SURG 15 STRL LF DISP TIS (BLADE) ×1 IMPLANT
BLADE SURG 15 STRL SS (BLADE) ×2
BLADE SURG ROTATE 9660 (MISCELLANEOUS) IMPLANT
CLOTH BEACON ORANGE TIMEOUT ST (SAFETY) ×2 IMPLANT
CONT SPEC 4OZ CLIKSEAL STRL BL (MISCELLANEOUS) ×2 IMPLANT
CORDS BIPOLAR (ELECTRODE) ×2 IMPLANT
DECANTER SPIKE VIAL GLASS SM (MISCELLANEOUS) ×2 IMPLANT
DEPRESSOR TONGUE BLADE STERILE (MISCELLANEOUS) ×2 IMPLANT
DERMABOND ADVANCED (GAUZE/BANDAGES/DRESSINGS) ×1
DERMABOND ADVANCED .7 DNX12 (GAUZE/BANDAGES/DRESSINGS) IMPLANT
DRAPE EXTREMITY T 121X128X90 (DRAPE) IMPLANT
DRAPE INCISE IOBAN 66X45 STRL (DRAPES) ×2 IMPLANT
DRAPE LAPAROTOMY 100X72 PEDS (DRAPES) IMPLANT
DRAPE PROXIMA HALF (DRAPES) ×2 IMPLANT
DRESSING TELFA 8X3 (GAUZE/BANDAGES/DRESSINGS) ×2 IMPLANT
DURAPREP 26ML APPLICATOR (WOUND CARE) ×2 IMPLANT
GAUZE SPONGE 4X4 16PLY XRAY LF (GAUZE/BANDAGES/DRESSINGS) ×2 IMPLANT
GLOVE BIO SURGEON STRL SZ 6.5 (GLOVE) IMPLANT
GLOVE BIO SURGEON STRL SZ7 (GLOVE) IMPLANT
GLOVE BIO SURGEON STRL SZ7.5 (GLOVE) IMPLANT
GLOVE BIO SURGEON STRL SZ8 (GLOVE) IMPLANT
GLOVE BIO SURGEON STRL SZ8.5 (GLOVE) IMPLANT
GLOVE BIOGEL M 8.0 STRL (GLOVE) IMPLANT
GLOVE ECLIPSE 6.5 STRL STRAW (GLOVE) ×4 IMPLANT
GLOVE ECLIPSE 7.0 STRL STRAW (GLOVE) IMPLANT
GLOVE ECLIPSE 7.5 STRL STRAW (GLOVE) IMPLANT
GLOVE ECLIPSE 8.0 STRL XLNG CF (GLOVE) IMPLANT
GLOVE ECLIPSE 8.5 STRL (GLOVE) IMPLANT
GLOVE EXAM NITRILE LRG STRL (GLOVE) IMPLANT
GLOVE EXAM NITRILE MD LF STRL (GLOVE) IMPLANT
GLOVE EXAM NITRILE XL STR (GLOVE) IMPLANT
GLOVE EXAM NITRILE XS STR PU (GLOVE) IMPLANT
GLOVE INDICATOR 6.5 STRL GRN (GLOVE) IMPLANT
GLOVE INDICATOR 7.0 STRL GRN (GLOVE) IMPLANT
GLOVE INDICATOR 7.5 STRL GRN (GLOVE) IMPLANT
GLOVE INDICATOR 8.0 STRL GRN (GLOVE) IMPLANT
GLOVE INDICATOR 8.5 STRL (GLOVE) IMPLANT
GLOVE OPTIFIT SS 8.0 STRL (GLOVE) IMPLANT
GLOVE SURG SS PI 6.5 STRL IVOR (GLOVE) IMPLANT
GOWN BRE IMP SLV AUR LG STRL (GOWN DISPOSABLE) ×2 IMPLANT
GOWN BRE IMP SLV AUR XL STRL (GOWN DISPOSABLE) IMPLANT
GOWN STRL REIN 2XL LVL4 (GOWN DISPOSABLE) IMPLANT
KIT BASIN OR (CUSTOM PROCEDURE TRAY) ×2 IMPLANT
KIT ROOM TURNOVER OR (KITS) ×2 IMPLANT
MARKER SKIN DUAL TIP RULER LAB (MISCELLANEOUS) ×2 IMPLANT
NDL HYPO 25X1 1.5 SAFETY (NEEDLE) ×1 IMPLANT
NEEDLE HYPO 25X1 1.5 SAFETY (NEEDLE) ×2 IMPLANT
NS IRRIG 1000ML POUR BTL (IV SOLUTION) ×2 IMPLANT
PACK SURGICAL SETUP 50X90 (CUSTOM PROCEDURE TRAY) ×2 IMPLANT
PAD ARMBOARD 7.5X6 YLW CONV (MISCELLANEOUS) ×2 IMPLANT
SPECIMEN JAR SMALL (MISCELLANEOUS) ×2 IMPLANT
STRIP CLOSURE SKIN 1/2X4 (GAUZE/BANDAGES/DRESSINGS) ×2 IMPLANT
SUT SILK 0 TIES 10X30 (SUTURE) ×3 IMPLANT
SUT SILK 2 0 TIES 10X30 (SUTURE) ×2 IMPLANT
SUT SILK 3 0 TIES 17X18 (SUTURE) ×2
SUT SILK 3-0 18XBRD TIE BLK (SUTURE) ×1 IMPLANT
SUT VIC AB 2-0 CP2 18 (SUTURE) ×1 IMPLANT
SUT VIC AB 2-0 CT1 27 (SUTURE) ×2
SUT VIC AB 2-0 CT1 27XBRD (SUTURE) ×1 IMPLANT
SUT VIC AB 3-0 SH 8-18 (SUTURE) ×2 IMPLANT
SYR BULB 3OZ (MISCELLANEOUS) ×2 IMPLANT
SYR CONTROL 10ML LL (SYRINGE) ×2 IMPLANT
TOWEL OR 17X24 6PK STRL BLUE (TOWEL DISPOSABLE) ×2 IMPLANT
TOWEL OR 17X26 10 PK STRL BLUE (TOWEL DISPOSABLE) ×2 IMPLANT
WATER STERILE IRR 1000ML POUR (IV SOLUTION) ×2 IMPLANT

## 2012-09-17 NOTE — Preoperative (Signed)
Beta Blockers   Reason not to administer Beta Blockers:Not Applicable 

## 2012-09-17 NOTE — Progress Notes (Signed)
Pts teeth returned and pt placed them in her mouth.

## 2012-09-17 NOTE — Anesthesia Preprocedure Evaluation (Addendum)
Anesthesia Evaluation  Patient identified by MRN, date of birth, ID band Patient awake    Reviewed: Allergy & Precautions, H&P , NPO status , Patient's Chart, lab work & pertinent test results, reviewed documented beta blocker date and time   Airway Mallampati: II TM Distance: >3 FB Neck ROM: full    Dental  (+) Upper Dentures, Edentulous Upper and Dental Advisory Given   Pulmonary shortness of breath, pneumonia -, resolved, COPD COPD inhaler, Current Smoker,  breath sounds clear to auscultation        Cardiovascular hypertension, On Medications and On Home Beta Blockers + CAD, + Past MI, + Peripheral Vascular Disease and +CHF + dysrhythmias Rhythm:regular     Neuro/Psych PSYCHIATRIC DISORDERS  Neuromuscular disease    GI/Hepatic Neg liver ROS, hiatal hernia, GERD-  Medicated and Poorly Controlled,  Endo/Other  negative endocrine ROS  Renal/GU negative Renal ROS  negative genitourinary   Musculoskeletal   Abdominal   Peds  Hematology  (+) Blood dyscrasia, ,   Anesthesia Other Findings See surgeon's H&P   Reproductive/Obstetrics negative OB ROS                          Anesthesia Physical Anesthesia Plan  ASA: III  Anesthesia Plan: General   Post-op Pain Management:    Induction: Intravenous  Airway Management Planned: Oral ETT  Additional Equipment:   Intra-op Plan:   Post-operative Plan: Extubation in OR  Informed Consent: I have reviewed the patients History and Physical, chart, labs and discussed the procedure including the risks, benefits and alternatives for the proposed anesthesia with the patient or authorized representative who has indicated his/her understanding and acceptance.   Dental Advisory Given and Dental advisory given  Plan Discussed with: CRNA, Surgeon and Anesthesiologist  Anesthesia Plan Comments:        Anesthesia Quick Evaluation

## 2012-09-17 NOTE — Anesthesia Postprocedure Evaluation (Signed)
Anesthesia Post Note  Patient: [name]  Procedure(s) Performed: Procedure(s) (LRB): GASTROCNEMIUS MUSCLE BIOPSY (Right)  Anesthesia type: General  Patient location: PACU  Post pain: Pain level controlled  Post assessment: Patient's Cardiovascular Status Stable  Last Vitals:  Filed Vitals:   09/17/12 1042  BP:   Pulse: 67  Temp:   Resp: 20    Post vital signs: Reviewed and stable  Level of consciousness: alert  Complications: No apparent anesthesia complications

## 2012-09-17 NOTE — Op Note (Signed)
09/17/2012  10:14 AM  PATIENT:  [name]  72 y.o. female  PRE-OPERATIVE DIAGNOSIS:  generalized weakness  POST-OPERATIVE DIAGNOSIS:  generalized weakness  PROCEDURE:  Procedure(s): Right deltoid MUSCLE BIOPSY  SURGEON:  Surgeon(s): Carmela Hurt, MD  ASSISTANTS:none  ANESTHESIA:   general  EBL:  Total I/O In: 700 [I.V.:700] Out: 5 [Blood:5]  BLOOD ADMINISTERED:none  CELL SAVER GIVEN:none  COUNT:per nursing  DRAINS: none   SPECIMEN:  Source of Specimen:  right deltoid muscle  DICTATION: Mrs. Hougland was brought to the operating room, intubated And placed under a general anesthetic without difficulty. Her right deltoid region was prepped and draped in a sterile manner. I opened the skin with a 10 blade and dissected sharply until I reached the fascia. I opened the fascia with a 15 blade. I then took two pieces of deltoid muscle. I injected the muscle bed with lidocAine. I closed the wound in layered fashion approximating the fascia and subcuticular layer with vicryl sutures. I used dermabond for a sterile dressing.   PLAN OF CARE: Discharge to home after PACU  PATIENT DISPOSITION:  PACU - hemodynamically stable.   Delay start of Pharmacological VTE agent (>24hrs) due to surgical blood loss or risk of bleeding:  yes

## 2012-09-17 NOTE — Transfer of Care (Signed)
Immediate Anesthesia Transfer of Care Note  Patient: [name]  Procedure(s) Performed: Procedure(s) (LRB) with comments: GASTROCNEMIUS MUSCLE BIOPSY (Right) - RIGHT DELTOID muscle biopsy  Patient Location: PACU  Anesthesia Type:General  Level of Consciousness: awake and alert   Airway & Oxygen Therapy: Patient Spontanous Breathing and Patient connected to face mask oxygen  Post-op Assessment: Report given to PACU RN and Post -op Vital signs reviewed and stable  Post vital signs: Reviewed and stable  Complications: No apparent anesthesia complications

## 2012-09-17 NOTE — Anesthesia Procedure Notes (Signed)
Procedure Name: Intubation Date/Time: 09/17/2012 9:13 AM Performed by: Brien Mates DOBSON Pre-anesthesia Checklist: Patient identified, Emergency Drugs available, Suction available, Patient being monitored and Timeout performed Patient Re-evaluated:Patient Re-evaluated prior to inductionOxygen Delivery Method: Circle system utilized Preoxygenation: Pre-oxygenation with 100% oxygen Intubation Type: IV induction and Cricoid Pressure applied Ventilation: Mask ventilation without difficulty and Oral airway inserted - appropriate to patient size Laryngoscope Size: Miller and 2 Grade View: Grade I Tube type: Oral Tube size: 7.5 mm Number of attempts: 1 Placement Confirmation: ETT inserted through vocal cords under direct vision,  positive ETCO2 and breath sounds checked- equal and bilateral Secured at: 21 cm Tube secured with: Tape Dental Injury: Teeth and Oropharynx as per pre-operative assessment

## 2012-09-17 NOTE — H&P (Signed)
BP 100/65  Pulse 73  Temp 98.1 F (36.7 C) (Oral)  Resp 18  SpO2 98% [name] is a 72 y.o. female With generalized weakness. She has had an unproductive workup as no etiology has been identified. I am asked to perform a muscle biopsy.  Prior to Admission medications   Medication Sig Start Date End Date Taking? Authorizing Provider  Ascorbic Acid (VITAMIN C) 500 MG tablet Take 1,000 mg by mouth daily.    Yes Historical Provider, MD  aspirin 81 MG tablet Take 81 mg by mouth daily.     Yes Historical Provider, MD  atorvastatin (LIPITOR) 80 MG tablet Take 1 tablet (80 mg total) by mouth daily at 6 PM. 07/30/12  Yes Ok Anis, NP  Cholecalciferol (VITAMIN D) 1000 UNITS capsule Take 1,000 Units by mouth 2 (two) times daily.     Yes Historical Provider, MD  cyanocobalamin 2000 MCG tablet Take 2,000 mcg by mouth daily.     Yes Historical Provider, MD  furosemide (LASIX) 40 MG tablet Take 40 mg by mouth daily.   Yes Historical Provider, MD  HYDROcodone-acetaminophen (NORCO/VICODIN) 5-325 MG per tablet Take 1 tablet by mouth Every 8 hours as needed. For pain 08/31/12  Yes Historical Provider, MD  losartan (COZAAR) 50 MG tablet Take 1 tablet (50 mg total) by mouth daily. 07/30/12  Yes Ok Anis, NP  methylPREDNISolone (MEDROL) 4 MG tablet Take 4 mg by mouth 2 (two) times daily.    Yes Historical Provider, MD  metoprolol succinate (TOPROL XL) 25 MG 24 hr tablet Take 1 tablet (25 mg total) by mouth daily. Take with or immediately following a meal. 07/30/12  Yes Ok Anis, NP  nitroGLYCERIN (NITROSTAT) 0.4 MG SL tablet Place 1 tablet (0.4 mg total) under the tongue every 5 (five) minutes as needed for chest pain. 01/30/12  Yes Luis Abed, MD  pantoprazole (PROTONIX) 40 MG tablet Take 40 mg by mouth daily.   Yes Historical Provider, MD  potassium chloride (KLOR-CON) 10 MEQ CR tablet Take 2 tablets (20 mEq total) by mouth daily. 07/30/12  Yes Ok Anis, NP   PROAIR HFA 108 (90 BASE) MCG/ACT inhaler Inhale 2 puffs into the lungs every 6 (six) hours as needed. For shortness breath 08/09/12  Yes Historical Provider, MD  ranitidine (ZANTAC) 150 MG capsule Take 300 mg by mouth every evening.    Yes Historical Provider, MD  trimethoprim (TRIMPEX) 100 MG tablet Take 100 mg by mouth daily.   Yes Historical Provider, MD   Past Surgical History  Procedure Date  . Laminectomy     lumbar, HX of L4-5 x 2  . Cervical disc compression     c5-c6 Hx of  . Appendectomy   . Cholecystectomy   . Bladder surgery     x 2  . Vaginal hysterectomy   . Rectocele repair   . Interstim medronic stimulator 04/2010  . Back surgery     x 2  . Eye surgery     bilateral cataract removal  . Cardiac catheterization 07/28/12    no PCI  . Colonoscopy    Past Medical History  Diagnosis Date  . Pulmonary embolism     a. 07/2012 CTA chest negative for PE.  Marland Kitchen Rheumatoid arthritis   . Hypertension   . Incontinence of feces   . Unspecified disorder of bladder   . Rectal prolapse   . Hiatal hernia   . IBS (irritable bowel syndrome)   .  GERD (gastroesophageal reflux disease)   . Pain in limb   . Lumbago     hospital admission with back pain in April, 2011 repair of a disc herniation L4-L5   . Osteoarthrosis, unspecified whether generalized or localized, unspecified site   . COPD (chronic obstructive pulmonary disease)   . Tobacco use disorder   . CAD (coronary artery disease) Non-obstructive    a. Nuclear, May, 2010, no ischemia;  b. cath 2012, nonobstructive CAD ( 40% circumflex, 40% LAD) EF normal;  c. 07/2012 NSTEMI/Cath: nonobs dzs, EF 40% w/ periapical AK.  . Carotid artery disease     a. Doppler, July, 2010, 40-59% bilateral, b. Dopplers 6/13:  40-59% bilat (f/u  01/2014)  . Adenomatous polyp 02/2008  . AVM (arteriovenous malformation)     Small colonic  . Shortness of breath     Cardiac evaluation, Dr.Katz, pulmonary evaluation, 2012, Dr. Delton Coombes.  Marland Kitchen UTI (lower  urinary tract infection)     Possible UTI by history June 14, 2011,   . Gout   . Shoulder pain, right   . Spinal stenosis   . Hyperlipidemia   . Glaucoma   . Splenomegaly   . Fatty infiltration of liver   . Cystitis   . Takotsubo cardiomyopathy Presumed    a. 07/2012 NSTEMI/Cath (nonobs dzs)/Echo:EF 45-50%, mid-distal antsept AK w/ Gr 1 DD.  Marland Kitchen Dysphagia     a. s/p prior esophageal dilatation.  . Myocardial infarction Jul 16, 2012  . CHF (congestive heart failure)   . Pneumonia     hx of; has not had since taken pneumonia vaccine   History   Social History  . Marital Status: Widowed    Spouse Name: N/A    Number of Children: 2  . Years of Education: N/A   Occupational History  . retired    Social History Main Topics  . Smoking status: Former Smoker -- 1.0 packs/day for 30 years    Quit date: 08/14/2007  . Smokeless tobacco: Never Used  . Alcohol Use: No  . Drug Use: No  . Sexually Active: Not on file   Other Topics Concern  . 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 -stopped about 3 years Illicit Drug Use - noPatient does not get regular exercise.    Family History  Problem Relation Age of Onset  . Arthritis Brother   . Colon cancer Paternal Grandmother   . Lung cancer Father     x 3 uncles and a aunt  . Brain cancer Father   . Heart disease Mother   . Kidney disease Mother     chronic UTI  . Diabetes Mother   . Diabetes Sister   . Ovarian cancer Maternal Aunt   . Heart disease Maternal Uncle   . Kidney cancer Maternal Uncle   . Heart disease Paternal Aunt   . Pancreatic cancer Maternal Grandmother    Physical Exam  Constitutional: She is oriented to person, place, and time. She appears well-developed and well-nourished. No distress.  HENT:  Head: Normocephalic and atraumatic.  Eyes: EOM are normal. Pupils are equal, round, and reactive to light.  Neck: Normal range of  motion. Neck supple.  Cardiovascular: Normal rate, regular rhythm and normal heart sounds.   Pulmonary/Chest: Effort normal.  Abdominal: Soft.  Musculoskeletal: Normal range of motion.  Neurological: She is alert and oriented to person, place, and time. She has normal  reflexes.  Skin: Skin is warm and dry.  Psychiatric: She has a normal mood and affect. Her behavior is normal. Judgment and thought content normal.  A/P Right deltoid muscle biopsy for diagnosis. Risks including bleeding, infection, non diagnostic biopsy, were discussed. She understands and wishes to proceed.

## 2012-09-19 ENCOUNTER — Encounter (HOSPITAL_COMMUNITY): Payer: Self-pay | Admitting: Neurosurgery

## 2012-09-19 ENCOUNTER — Ambulatory Visit (HOSPITAL_COMMUNITY): Payer: Medicare Other

## 2012-09-22 ENCOUNTER — Ambulatory Visit (HOSPITAL_COMMUNITY): Payer: Medicare Other

## 2012-09-24 ENCOUNTER — Ambulatory Visit (HOSPITAL_COMMUNITY): Payer: Medicare Other

## 2012-09-26 ENCOUNTER — Ambulatory Visit (HOSPITAL_COMMUNITY): Payer: Medicare Other

## 2012-09-26 ENCOUNTER — Encounter: Payer: Self-pay | Admitting: Cardiology

## 2012-09-26 DIAGNOSIS — R748 Abnormal levels of other serum enzymes: Secondary | ICD-10-CM | POA: Insufficient documentation

## 2012-09-26 DIAGNOSIS — I5181 Takotsubo syndrome: Secondary | ICD-10-CM | POA: Insufficient documentation

## 2012-09-26 DIAGNOSIS — R1310 Dysphagia, unspecified: Secondary | ICD-10-CM | POA: Insufficient documentation

## 2012-09-29 ENCOUNTER — Ambulatory Visit (HOSPITAL_COMMUNITY): Payer: Medicare Other

## 2012-09-29 ENCOUNTER — Ambulatory Visit (INDEPENDENT_AMBULATORY_CARE_PROVIDER_SITE_OTHER): Payer: Medicare Other | Admitting: Cardiology

## 2012-09-29 ENCOUNTER — Encounter: Payer: Self-pay | Admitting: Cardiology

## 2012-09-29 VITALS — BP 122/62 | HR 72 | Ht 64.0 in | Wt 150.0 lb

## 2012-09-29 DIAGNOSIS — I10 Essential (primary) hypertension: Secondary | ICD-10-CM

## 2012-09-29 DIAGNOSIS — R4789 Other speech disturbances: Secondary | ICD-10-CM

## 2012-09-29 DIAGNOSIS — I5181 Takotsubo syndrome: Secondary | ICD-10-CM

## 2012-09-29 DIAGNOSIS — I2510 Atherosclerotic heart disease of native coronary artery without angina pectoris: Secondary | ICD-10-CM

## 2012-09-29 DIAGNOSIS — R479 Unspecified speech disturbances: Secondary | ICD-10-CM | POA: Insufficient documentation

## 2012-09-29 DIAGNOSIS — R748 Abnormal levels of other serum enzymes: Secondary | ICD-10-CM

## 2012-09-29 NOTE — Assessment & Plan Note (Signed)
The patient has nonobstructive coronary disease. She had an episode of Takotsubo December, 2013. Ejection fraction was 45-50% at that time. It is time now to do a followup two-dimensional echo to reassess her LV function. This will be done I'll be in touch with the information

## 2012-09-29 NOTE — Assessment & Plan Note (Signed)
The patient mentions that she has some difficulty with speech at times. She tells me that she was able to have an MRI of the head with no marked abnormality. She has been told by neurology that this does not completely rule out a very small event that could affect her speech.

## 2012-09-29 NOTE — Assessment & Plan Note (Signed)
Blood pressures control. No change in therapy. 

## 2012-09-29 NOTE — Assessment & Plan Note (Signed)
Patient had a recent muscle biopsy. The result is pending.

## 2012-09-29 NOTE — Assessment & Plan Note (Addendum)
This episode occurred in December, 2013. It is now time for follow up echo.  As part of today's evaluation I spent greater than 25 minutes with her total care. More than half of this time was spent speaking with her directly in evaluating her and talking about her cardiac status and evaluation for weakness.

## 2012-09-29 NOTE — Patient Instructions (Addendum)
Your physician recommends that you schedule a follow-up appointment in: 3 months  Your physician has requested that you have an echocardiogram. Echocardiography is a painless test that uses sound waves to create images of your heart. It provides your doctor with information about the size and shape of your heart and how well your heart's chambers and valves are working. This procedure takes approximately one hour. There are no restrictions for this procedure.   

## 2012-09-29 NOTE — Progress Notes (Signed)
Patient ID: [name], female   DOB: 1941-02-26, 72 y.o.   MRN: [id number]   HPI  The patient is seen to follow up her cardiac status. I saw her last in the office April, 2013. Since that time she has many medical problems. I have reviewed the extensive records. In December she had chest pain and had an episode felt to be Takotsubo. Her catheterization did not show severe coronary disease. She had significant wall motion abnormalities. She is stable in this regard.  In addition the patient has Persistently elevated CPK. She had a muscle biopsy of the right arm. This data is pending. This workup is being done because she has generalized weakness And elevated CPK.  She's not been having any significant chest pain.  Allergies  Allergen Reactions  . Methotrexate Derivatives Shortness Of Breath and Nausea Only  . Levofloxacin Swelling  . Nitrofurantoin     *MACROBID*  Tongue swelling    Current Outpatient Prescriptions  Medication Sig Dispense Refill  . Ascorbic Acid (VITAMIN C) 500 MG tablet Take 1,000 mg by mouth daily.       Marland Kitchen aspirin 81 MG tablet Take 81 mg by mouth daily.        Marland Kitchen atorvastatin (LIPITOR) 80 MG tablet Take 1 tablet (80 mg total) by mouth daily at 6 PM.  30 tablet  6  . Cholecalciferol (VITAMIN D) 1000 UNITS capsule Take 1,000 Units by mouth 2 (two) times daily.        . cyanocobalamin 2000 MCG tablet Take 2,000 mcg by mouth daily.        . furosemide (LASIX) 40 MG tablet Take 40 mg by mouth daily.      Marland Kitchen HYDROcodone-acetaminophen (NORCO/VICODIN) 5-325 MG per tablet Take 1 tablet by mouth every 6 (six) hours as needed for pain.  60 tablet  0  . losartan (COZAAR) 50 MG tablet Take 1 tablet (50 mg total) by mouth daily.  30 tablet  6  . methylPREDNISolone (MEDROL) 4 MG tablet Take 4 mg by mouth 2 (two) times daily.       . metoprolol succinate (TOPROL XL) 25 MG 24 hr tablet Take 1 tablet (25 mg total) by mouth daily. Take with or immediately following a meal.  30  tablet  6  . nitroGLYCERIN (NITROSTAT) 0.4 MG SL tablet Place 1 tablet (0.4 mg total) under the tongue every 5 (five) minutes as needed for chest pain.  25 tablet  6  . pantoprazole (PROTONIX) 40 MG tablet Take 40 mg by mouth daily.      . potassium chloride (KLOR-CON) 10 MEQ CR tablet Take 2 tablets (20 mEq total) by mouth daily.  60 tablet  3  . PROAIR HFA 108 (90 BASE) MCG/ACT inhaler Inhale 2 puffs into the lungs every 6 (six) hours as needed. For shortness breath      . ranitidine (ZANTAC) 150 MG capsule Take 300 mg by mouth every evening.       . trimethoprim (TRIMPEX) 100 MG tablet Take 100 mg by mouth daily.      . [DISCONTINUED] Etanercept (ENBREL [place]) Inject 1 mg into the skin. Once a week        No current facility-administered medications for this visit.    History   Social History  . Marital Status: Widowed    Spouse Name: N/A    Number of Children: 2  . Years of Education: N/A   Occupational History  . retired    Chief Executive Officer  History Main Topics  . Smoking status: Former Smoker -- 1.00 packs/day for 30 years    Quit date: 08/14/2007  . Smokeless tobacco: Never Used  . Alcohol Use: No  . Drug Use: No  . Sexually Active: Not on file   Other Topics Concern  . Not on file   Social History Narrative   Single widow   Alcohol Use - no   Retired from [employer]   Patient is a former smoker. -stopped about 3 years    Illicit Drug Use - no   Patient does not get regular exercise.     Family History  Problem Relation Age of Onset  . Arthritis Brother   . Colon cancer Paternal Grandmother   . Lung cancer Father     x 3 uncles and a aunt  . Brain cancer Father   . Heart disease Mother   . Kidney disease Mother     chronic UTI  . Diabetes Mother   . Diabetes Sister   . Ovarian cancer Maternal Aunt   . Heart disease Maternal Uncle   . Kidney cancer Maternal Uncle   . Heart disease Paternal Aunt   . Pancreatic cancer Maternal Grandmother     Past Medical  History  Diagnosis Date  . Pulmonary embolism     a. 07/2012 CTA chest negative for PE.  Marland Kitchen Rheumatoid arthritis   . Hypertension   . Incontinence of feces   . Unspecified disorder of bladder   . Rectal prolapse   . Hiatal hernia   . IBS (irritable bowel syndrome)   . GERD (gastroesophageal reflux disease)   . Pain in limb   . Lumbago     hospital admission with back pain in April, 2011 repair of a disc herniation L4-L5   . Osteoarthrosis, unspecified whether generalized or localized, unspecified site   . COPD (chronic obstructive pulmonary disease)   . Tobacco use disorder   . CAD (coronary artery disease) Non-obstructive    a. Nuclear, May, 2010, no ischemia;  b. cath 2012, nonobstructive CAD ( 40% circumflex, 40% LAD) EF normal;  c. 07/2012 NSTEMI/Cath: nonobs dzs, EF 40% w/ periapical AK.  . Carotid artery disease     a. Doppler, July, 2010, 40-59% bilateral, b. Dopplers 6/13:  40-59% bilat (f/u  01/2014)  . Adenomatous polyp 02/2008  . AVM (arteriovenous malformation)     Small colonic  . Shortness of breath     Cardiac evaluation, Dr.Tawania Daponte, pulmonary evaluation, 2012, Dr. Delton Coombes.  Marland Kitchen UTI (lower urinary tract infection)     Possible UTI by history June 14, 2011,   . Gout   . Shoulder pain, right   . Spinal stenosis   . Hyperlipidemia   . Glaucoma   . Splenomegaly   . Fatty infiltration of liver   . Cystitis   . Takotsubo cardiomyopathy Presumed    a. 07/2012 NSTEMI/Cath (nonobs dzs)/Echo:EF 45-50%, mid-distal antsept AK w/ Gr 1 DD.  Marland Kitchen Dysphagia     a. s/p prior esophageal dilatation. //  Dysarthria and dysphasia hospitalization December, 2013, neurology workup ongoing  . CHF (congestive heart failure)   . Pneumonia     hx of; has not had since taken pneumonia vaccine  . Elevated CPK     Been evaluated with muscle biopsy, February, 2014  . Speech abnormality     January, 2014, question small CVA    Past Surgical History  Procedure Laterality Date  . Laminectomy  lumbar, HX of L4-5 x 2  . Cervical disc compression      c5-c6 Hx of  . Appendectomy    . Cholecystectomy    . Bladder surgery      x 2  . Vaginal hysterectomy    . Rectocele repair    . Interstim medronic stimulator  04/2010  . Back surgery      x 2  . Eye surgery      bilateral cataract removal  . Cardiac catheterization  07/28/12    no PCI  . Colonoscopy    . Muscle biopsy  09/17/2012    Procedure: GASTROCNEMIUS MUSCLE BIOPSY;  Surgeon: Carmela Hurt, MD;  Location: [facility];  Service: Neurosurgery;  Laterality: Right;  RIGHT DELTOID muscle biopsy    Patient Active Problem List  Diagnosis  . GASTROESOPHAGEAL REFLUX DISEASE  . HIATAL HERNIA  . IRRITABLE BOWEL SYNDROME  . RECTAL PROLAPSE  . BLADDER PROLAPSE  . OSTEOARTHRITIS  . INCONTINENCE OF FECES  . Other acquired absence of organ  . HEMORRHOIDS-INTERNAL  . ANGIODYSPLASIA-INTESTINE  . ABDOMINAL PAIN, LEFT UPPER QUADRANT  . PERSONAL HISTORY OF COLONIC POLYPS  . PREMATURE VENTRICULAR CONTRACTIONS  . Pulmonary embolism  . Rheumatoid arthritis  . Hypertension  . COPD (chronic obstructive pulmonary disease)  . Tobacco use disorder  . CAD (coronary artery disease)  . AVM (arteriovenous malformation)  . Shortness of breath  . Ejection fraction  . Carotid artery disease  . UTI (lower urinary tract infection)  . Interstitial lung disease  . Elevated CPK  . Dysphagia  . Takotsubo cardiomyopathy  . Speech abnormality    ROS     Patient denies fever, chills, headache, sweats, rash, change in vision, change in hearing, chest pain, cough, nausea vomiting, urinary symptoms.  PHYSICAL EXAM  Patient is oriented to person time and place. Affect is normal. There is no jugulovenous distention. Lungs are clear. Respiratory effort is nonlabored. Cardiac exam reveals S1-S2. There no clicks or significant murmurs. The abdomen is soft. There is no peripheral edema. The area of the muscle biopsy in her right arm is healing  nicely.  Filed Vitals:   09/29/12 1351  BP: 122/62  Pulse: 72  Height: 5\' 4"  (1.626 m)  Weight: 150 lb (68.04 kg)    ASSESSMENT & PLAN

## 2012-10-01 ENCOUNTER — Ambulatory Visit (HOSPITAL_COMMUNITY): Payer: Medicare Other

## 2012-10-03 ENCOUNTER — Ambulatory Visit (HOSPITAL_COMMUNITY): Payer: Medicare Other

## 2012-10-06 ENCOUNTER — Ambulatory Visit (HOSPITAL_COMMUNITY): Payer: Medicare Other

## 2012-10-06 ENCOUNTER — Ambulatory Visit (HOSPITAL_COMMUNITY): Payer: Medicare Other | Attending: Cardiovascular Disease | Admitting: Radiology

## 2012-10-06 DIAGNOSIS — I5181 Takotsubo syndrome: Secondary | ICD-10-CM | POA: Insufficient documentation

## 2012-10-06 DIAGNOSIS — I10 Essential (primary) hypertension: Secondary | ICD-10-CM | POA: Insufficient documentation

## 2012-10-06 DIAGNOSIS — I079 Rheumatic tricuspid valve disease, unspecified: Secondary | ICD-10-CM | POA: Insufficient documentation

## 2012-10-06 DIAGNOSIS — I080 Rheumatic disorders of both mitral and aortic valves: Secondary | ICD-10-CM | POA: Insufficient documentation

## 2012-10-06 DIAGNOSIS — I2510 Atherosclerotic heart disease of native coronary artery without angina pectoris: Secondary | ICD-10-CM | POA: Insufficient documentation

## 2012-10-06 DIAGNOSIS — Z86711 Personal history of pulmonary embolism: Secondary | ICD-10-CM | POA: Insufficient documentation

## 2012-10-06 DIAGNOSIS — E785 Hyperlipidemia, unspecified: Secondary | ICD-10-CM | POA: Insufficient documentation

## 2012-10-06 DIAGNOSIS — I428 Other cardiomyopathies: Secondary | ICD-10-CM

## 2012-10-06 NOTE — Progress Notes (Signed)
Echocardiogram performed.  

## 2012-10-08 ENCOUNTER — Ambulatory Visit (HOSPITAL_COMMUNITY): Payer: Medicare Other

## 2012-10-10 ENCOUNTER — Ambulatory Visit (HOSPITAL_COMMUNITY): Payer: Medicare Other

## 2012-10-13 ENCOUNTER — Ambulatory Visit (HOSPITAL_COMMUNITY): Payer: Medicare Other

## 2012-10-14 ENCOUNTER — Encounter: Payer: Self-pay | Admitting: Cardiology

## 2012-10-15 ENCOUNTER — Ambulatory Visit (HOSPITAL_COMMUNITY): Payer: Medicare Other

## 2012-10-17 ENCOUNTER — Ambulatory Visit (HOSPITAL_COMMUNITY): Payer: Medicare Other

## 2012-10-20 ENCOUNTER — Ambulatory Visit (HOSPITAL_COMMUNITY): Payer: Medicare Other

## 2012-10-22 ENCOUNTER — Ambulatory Visit (HOSPITAL_COMMUNITY): Payer: Medicare Other

## 2012-10-24 ENCOUNTER — Ambulatory Visit (HOSPITAL_COMMUNITY): Payer: Medicare Other

## 2012-10-27 ENCOUNTER — Ambulatory Visit (HOSPITAL_COMMUNITY): Payer: Medicare Other

## 2012-10-29 ENCOUNTER — Ambulatory Visit (HOSPITAL_COMMUNITY): Payer: Medicare Other

## 2012-10-31 ENCOUNTER — Ambulatory Visit (HOSPITAL_COMMUNITY): Payer: Medicare Other

## 2012-11-03 ENCOUNTER — Ambulatory Visit (HOSPITAL_COMMUNITY): Payer: Medicare Other

## 2012-11-05 ENCOUNTER — Ambulatory Visit (HOSPITAL_COMMUNITY): Payer: Medicare Other

## 2012-11-07 ENCOUNTER — Ambulatory Visit (HOSPITAL_COMMUNITY): Payer: Medicare Other

## 2012-11-10 ENCOUNTER — Ambulatory Visit (HOSPITAL_COMMUNITY): Payer: Medicare Other

## 2012-11-12 ENCOUNTER — Ambulatory Visit (HOSPITAL_COMMUNITY): Payer: Medicare Other

## 2012-11-14 ENCOUNTER — Ambulatory Visit (HOSPITAL_COMMUNITY): Payer: Medicare Other

## 2012-11-17 ENCOUNTER — Ambulatory Visit (HOSPITAL_COMMUNITY): Payer: Medicare Other

## 2012-11-19 ENCOUNTER — Telehealth: Payer: Self-pay | Admitting: Emergency Medicine

## 2012-11-19 ENCOUNTER — Ambulatory Visit (HOSPITAL_COMMUNITY): Payer: Medicare Other

## 2012-11-19 NOTE — Telephone Encounter (Signed)
I spoke with pt and she stated she has had increase SOB getting worse. She is scheduled to come in and see MR tomorrow at 1:45 for acute visit. Nothing further was needed

## 2012-11-20 ENCOUNTER — Encounter: Payer: Self-pay | Admitting: Internal Medicine

## 2012-11-20 ENCOUNTER — Ambulatory Visit (INDEPENDENT_AMBULATORY_CARE_PROVIDER_SITE_OTHER): Payer: Medicare Other | Admitting: Internal Medicine

## 2012-11-20 VITALS — BP 124/70 | HR 81 | Temp 97.5°F | Ht 63.5 in | Wt 157.6 lb

## 2012-11-20 DIAGNOSIS — J849 Interstitial pulmonary disease, unspecified: Secondary | ICD-10-CM

## 2012-11-20 DIAGNOSIS — R0609 Other forms of dyspnea: Secondary | ICD-10-CM

## 2012-11-20 DIAGNOSIS — J8410 Pulmonary fibrosis, unspecified: Secondary | ICD-10-CM

## 2012-11-20 DIAGNOSIS — R0600 Dyspnea, unspecified: Secondary | ICD-10-CM

## 2012-11-20 NOTE — Progress Notes (Signed)
Subjective:    Patient ID: [name], female    DOB: 1940-12-28, 72 y.o.   MRN: [id number]  HPI 72 yo former smoker, hx carotid disease, RA diagnosed by Dr Nickola Major in June '11, started on MTX in 7/11.  No evidence CAD by myoview in 5/10. Also hx PE in 1988 after a bladder surgery, anti-coag x 109mo.  Referred by Dr Cliffton Asters to evaluate SOB. She tells me that her breathing had some minor limitations up to 2 yrs ago. This improved some when she was treated for Vit D and B12 deficiency. Then became much more bothersome Summer 2010 when she started Prednisone, followed by MTX for her RA. She has gained about 30 lbs since the Summer. She believes that the SOB relates to the initiation of these meds. She was started on Spiriva 12/11 ago - no difference in her breathing.   ROV 08/23/10 -- Hx tobacco, follows up for evaluation of SOB. We performed CBC that showed no anemia, CXR without evidence for ILD. Tells me that MTX was stopped last Friday, still on pred. Having some nasal congestion today. Her breathing is still difficult w exertion. We stopped Spiriva last time, she doesn't miss it. Not on any other BDs.   ROV 01/03/11 -- hx tobacco, RA on MTX. CAD being followed by Dr Myrtis Ser. L and R cath in 2/12 showed CAD, normal LV and RV pressures. Has restriction vs mixed disease - did not improve with Spiriva and albuterol. Has been off prednisone since January. She had a CT scan chest that showed very subtle subpleural interstitial changes.   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 Also mixed dz on PFT but no clinical response to Spiriva + SABA. Returns for f/u CT scan chest done 06/28/11. She tells me that she has been more SOB since last visit, was admitted to hosp 10/12 for dyspnea and chest pain, was felt to be volume overloaded and responded to diuretics - improved resp status. Her LV fxn has been normal by TTE, has diastolic dysfxn and A Fib as potential causes (?  Related to Embrel also - started 3-4 months ago). She remains on MTX. Her CT scan shows progression of sub-pleural fibrosis. Her MTX was increased last week - she isn't certain that the MTX has every helped her.   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 Also mixed dz on PFT but no clinical response to Spiriva + SABA. Her Embrel was stopped due to CHF exacerbation, but she is still on MTX. Her breathing hasn't really changed very much off the Embrel. She was temporarily off of MTX for three weeks, thought that her breathing was better. Her last CT scan 11/12 showed subpleural ILD, ? Related to MTX.   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 Also mixed dz on PFT but no clinical response to Spiriva + SABA. Her MTX was stopped by Dr Nickola Major 10/2011. She continues to have low energy. Breathing is about the same, still able to walk 2 miles every day. Last TTE was 2011.   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 Also mixed dz on PFT but no clinical response to Spiriva + SABA. Repeat TTE 4/24 showed estimated RVSP . She is still able to walk her two miles, but she has dyspnea with chores,  sweeping, etc. Her wt has gone up over the last year, probably 7-8 lbs.   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 Also mixed dz on PFT but no clinical response to Spiriva + SABA.  Returns after repeat CT scan of the chest to eval her ILD and nodules >> stable. She tells me today that her breathing has been stable to improved.  She was started on Pred for the last 2 weeks for joint pain - also seems to have made her breathing better.   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 Also mixed dz on PFT but no clinical response to Spiriva + SABA. Last time discussed retrying albuterol for  DOE. She tells me that since last visit she was admitted for NSTEMI. L cath done without intervention. 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   CT scan 07/26/12 --  Comparison: CT chest 01/03/2012  Findings:  Scattered atherosclerotic calcifications aorta and coronary  arteries.  No aortic aneurysm or dissection.  Scattered normal-sized mediastinal lymph nodes.  Spleen appears enlarged, 12.5 x 8.9 cm image 89, length not fully  imaged.  Pulmonary arteries patent.  No evidence pulmonary embolism.  Minimal dependent atelectasis in both lungs.  No acute infiltrate, pleural effusion or pneumothorax.  Osseous structures unremarkable.  IMPRESSION:  No evidence pulmonary embolism.  Dependent atelectasis in both lungs.  Scattered atherosclerotic disease changes of the aorta and coronary  arteries.  Question splenomegaly, incompletely imaged.   Review of Systems  as per HPI  Please practice your swallowing precautions to minimize getting choked by your food, drink and reflux.  Use albuterol 2 puffs if needed for shortness of breath  Follow with Dr Delton Coombes in 12 months or sooner if you have any problems   OV 11/20/2012 Acute visit for Dr. Delton Coombes patient with NEW PROBLEM OF DYSPNEA.  Has rheumatoid arthritis with interstitial lung disease not otherwise specified. She has been off methotrexate and Enbrel for over a year. For the last several months she's had generalized muscle weakness. 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 Then 3 weeks ago was started on prednisone 60 mg once a day. Since then 15 pound weight gain and insidious onset of dyspnea that is progressively worse. At baseline she can walk one or 2 miles. However, now she is unable to do activities of daily living without dyspnea. Dyspnea is relieved by rest and rated as moderate in severity. There is no associated cough or chest pain but that is associated weight  gain. No fever no cough no phlegm no edema   Past, Family, Social reviewed: no change since last visit except as noted in history of present illness  Review of Systems  Constitutional: Negative for fever and unexpected weight change.  HENT: Negative for ear pain, nosebleeds, congestion, sore throat, rhinorrhea, sneezing, trouble swallowing, dental problem, postnasal drip and sinus pressure.   Eyes: Negative for redness and itching.  Respiratory: Positive for shortness of breath. Negative for cough, chest tightness and wheezing.   Cardiovascular: Negative for palpitations and leg swelling.  Gastrointestinal: Negative for nausea and vomiting.  Genitourinary: Negative for dysuria.  Musculoskeletal: Negative for joint swelling.  Skin: Negative for rash.  Neurological: Negative for headaches.  Hematological: Does not bruise/bleed easily.  Psychiatric/Behavioral: Negative for dysphoric mood. The patient is not nervous/anxious.  Objective:   Physical Exam Vitals signs reviewed  Gen: Pleasant, obese, in no distress,  normal affect  ENT: No lesions,  mouth clear,  oropharynx clear, no postnasal drip  Neck: No JVD, no TMG, no carotid bruits  Lungs: R > L basilar insp crackles, clear superiorly  Cardiovascular: RRR, heart sounds normal, no murmur or gallops, no LE edema  Musculoskeletal: No deformities, no cyanosis or clubbing  Neuro: alert, non focal  Skin: Warm, no lesions or rashes          Assessment & Plan:

## 2012-11-20 NOTE — Patient Instructions (Addendum)
Unclear why you exactly more short of breath Could be related to 15 pound weight gain since starting prednisone 3 weeks ago Could be related to muscle weakness from myositis Could be related to progressive interstitial lung disease  Please have overnight oxygen study at home; need to have nail polish remover this test Please do full pulmonary function test  - Do it in the next few days at [place] or [place] Please do a CT scan of the chest without contrast  Return to see Dr. Byrum after the above in the next 10 days   

## 2012-11-21 ENCOUNTER — Ambulatory Visit (HOSPITAL_COMMUNITY): Payer: Medicare Other

## 2012-11-24 ENCOUNTER — Telehealth: Payer: Self-pay | Admitting: Emergency Medicine

## 2012-11-24 ENCOUNTER — Ambulatory Visit (HOSPITAL_COMMUNITY): Payer: Medicare Other

## 2012-11-24 NOTE — Telephone Encounter (Signed)
Provided the contact # for [facility] PFT and [facility] for the pt to reschedule. Carron Curie, CMA

## 2012-11-25 ENCOUNTER — Encounter (HOSPITAL_COMMUNITY): Payer: Medicare Other

## 2012-11-25 ENCOUNTER — Other Ambulatory Visit: Payer: Medicare Other

## 2012-11-25 DIAGNOSIS — R0600 Dyspnea, unspecified: Secondary | ICD-10-CM | POA: Insufficient documentation

## 2012-11-25 NOTE — Assessment & Plan Note (Signed)
Unclear why you exactly more short of breath Could be related to 15 pound weight gain since starting prednisone 3 weeks ago Could be related to muscle weakness from myositis Could be related to progressive interstitial lung disease  Please have overnight oxygen study at home; need to have nail polish remover this test Please do full pulmonary function test  - Do it in the next few days at [facility] or [place] Please do a CT scan of the chest without contrast  Return to see Dr. Delton Coombes after the above in the next 10 days

## 2012-11-26 ENCOUNTER — Ambulatory Visit (HOSPITAL_COMMUNITY): Payer: Medicare Other

## 2012-11-27 ENCOUNTER — Encounter: Payer: Self-pay | Admitting: Physician Assistant

## 2012-11-28 ENCOUNTER — Other Ambulatory Visit: Payer: Medicare Other

## 2012-11-28 ENCOUNTER — Ambulatory Visit (HOSPITAL_COMMUNITY): Payer: Medicare Other

## 2012-11-28 ENCOUNTER — Encounter (HOSPITAL_COMMUNITY): Payer: Medicare Other

## 2012-12-01 ENCOUNTER — Ambulatory Visit (HOSPITAL_COMMUNITY): Payer: Medicare Other

## 2012-12-03 ENCOUNTER — Ambulatory Visit (INDEPENDENT_AMBULATORY_CARE_PROVIDER_SITE_OTHER)
Admission: RE | Admit: 2012-12-03 | Discharge: 2012-12-03 | Disposition: A | Discharge status: Home / Self Care | Payer: Medicare Other | Source: Ambulatory Visit | Attending: Internal Medicine | Admitting: Internal Medicine

## 2012-12-03 ENCOUNTER — Ambulatory Visit (INDEPENDENT_AMBULATORY_CARE_PROVIDER_SITE_OTHER): Payer: Medicare Other | Admitting: Physician Assistant

## 2012-12-03 ENCOUNTER — Ambulatory Visit (HOSPITAL_COMMUNITY): Payer: Medicare Other

## 2012-12-03 ENCOUNTER — Ambulatory Visit (HOSPITAL_COMMUNITY)
Admission: RE | Admit: 2012-12-03 | Discharge: 2012-12-03 | Disposition: A | Discharge status: Home / Self Care | Payer: Medicare Other | Source: Ambulatory Visit | Attending: Internal Medicine | Admitting: Internal Medicine

## 2012-12-03 ENCOUNTER — Encounter: Payer: Self-pay | Admitting: Physician Assistant

## 2012-12-03 VITALS — BP 130/72 | HR 79 | Ht 63.5 in | Wt 160.0 lb

## 2012-12-03 DIAGNOSIS — J8410 Pulmonary fibrosis, unspecified: Secondary | ICD-10-CM

## 2012-12-03 DIAGNOSIS — G7241 Inclusion body myositis [IBM]: Secondary | ICD-10-CM

## 2012-12-03 DIAGNOSIS — J849 Interstitial pulmonary disease, unspecified: Secondary | ICD-10-CM

## 2012-12-03 DIAGNOSIS — I10 Essential (primary) hypertension: Secondary | ICD-10-CM

## 2012-12-03 DIAGNOSIS — I5181 Takotsubo syndrome: Secondary | ICD-10-CM

## 2012-12-03 DIAGNOSIS — R0602 Shortness of breath: Secondary | ICD-10-CM

## 2012-12-03 DIAGNOSIS — R079 Chest pain, unspecified: Secondary | ICD-10-CM

## 2012-12-03 DIAGNOSIS — I2510 Atherosclerotic heart disease of native coronary artery without angina pectoris: Secondary | ICD-10-CM

## 2012-12-03 MED ORDER — ISOSORBIDE MONONITRATE ER 30 MG PO TB24: 15.0000 mg | ORAL_TABLET | Freq: Every day | ORAL | Status: DC

## 2012-12-03 NOTE — Patient Instructions (Addendum)
PLEASE FOLLOW UP WITH DR. KATZ IN THE NEXT 2-3 WEEKS PER SCOTT WEAVER, PAC IN REFERENCE TO CHEST CT  START 12/04/12 IMDUR 30 MG TABLET YOU WILL TAKE 1/2 TABLET DAILY  YOU HAVE BEEN GIVEN A PRESCRIPTION TO TAKE TO RHEUMATOLOGIST FOR  BMET, BNP WITH RESULT TO BE FAXED TO Azucena Kuba [phone]

## 2012-12-03 NOTE — Progress Notes (Signed)
[address], [address] [place], [place]  [address] Phone: [phone] Fax:  [phone]  Date:  12/03/2012   ID:  [name] [place], DOB 12/15/1940, MRN [id number]  PCP:  Cala Bradford, MD  Primary Cardiologist:  Dr. Zackery Barefoot     History of Present Illness: [name] is a 72 y.o. female who returns for evaluation of dyspnea and chest pain.  She has a h/o remote pulmonary embolism, carotid stenosis, HTN, COPD, GERD, rheumatoid arthritis and irritable bowel syndrome. LHC in 2002 with minimal plaque. Myoview 5/10: no ischemia and an EF of 50%. Echo 4/11: EF of 55-60%; Gr 1 diastolic dysfunction.  PFTs in 2012 with no significant abnormality. Repeat LHC 09/30/10 with mild non-obs CAD.  Her EF was normal and her left and right heart filling pressures were not significantly elevated.  She was admitted 07/2012 with a NSTEMI. Chest CT was negative for pulmonary embolism.  Echo 07/27/12: EF 45-50%, mid to distal anteroseptal AK, grade 1 diastolic dysfunction. LHC 07/28/12: mLAD 40-50%, pCFX 40-50%, RCA ok, EF 40% with periapical AK. 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  Last seen by Dr. Myrtis Ser 09/2012. At that time, it was noted that she has a persistently elevated CK and had recently had a muscle biopsy. Follow up echo was arranged and demonstrated recovered LV function.  Echo 09/2012: EF 55%, mild LAE.    She recently saw pulmonary in followup for worsening dyspnea. She has a history of interstitial lung disease not otherwise specified. 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 She is on prednisone.  She has a reported 15 pound weight gain. Pulmonary function testing has been arranged as well as a chest CT.  Chest CT was done today prior to seeing the patient.  Patient tells me that she always has worsening dyspnea when she starts on prednisone.  This was started in response to her Bx results ~ 1.5 mos ago.  She notes  improved dyspnea since her dose was reduced.  She describes NYHA Class III-IIIb symptoms.  No orthopnea.  She has had some ? PND.  No edema.  She had CP on 2 occasions last week.  It did awaken her from sleep once.  It is tight.  Also notes chest tightness with exertion.  She also feels it in her jaws.  No assoc nausea. No syncope.  CP lasts only 2 mins.  She denies CP assoc with meals.    Labs (12/13): K 4.7, creatinine 0.79, Mg 1.9, LDL 123, Hgb 13.6, TSH 2.985 Labs (2/14):   K 3.9, Cr 0.86, Hgb 13.6 Labs (4/14):   K 4.1, Cr 1.05, Hgb 11.5, ALT 15  Wt Readings from Last 3 Encounters:  12/03/12 160 lb (72.576 kg)  11/20/12 157 lb 9.6 oz (71.487 kg)  09/29/12 150 lb (68.04 kg)     Past Medical History  Diagnosis Date  . Pulmonary embolism     a. 07/2012 CTA chest negative for PE.  Marland Kitchen Rheumatoid arthritis   . Hypertension   . Incontinence of feces   . Unspecified disorder of bladder   . Rectal prolapse   . Hiatal hernia   . IBS (irritable bowel syndrome)   . GERD (gastroesophageal reflux disease)   . Pain in limb   . Lumbago     hospital admission with back pain in April, 2011 repair of a disc herniation L4-L5   . Osteoarthrosis, unspecified whether generalized or localized, unspecified  site   . COPD (chronic obstructive pulmonary disease)   . Tobacco use disorder   . CAD (coronary artery disease) Non-obstructive    a. Nuclear, May, 2010, no ischemia;  b. cath 2012, nonobstructive CAD ( 40% circumflex, 40% LAD) EF normal;  c. 07/2012 NSTEMI/Cath: nonobs dzs, EF 40% w/ periapical AK.  . Carotid artery disease     a. Doppler, July, 2010, 40-59% bilateral, b. Dopplers 6/13:  40-59% bilat (f/u  01/2014)  . Adenomatous polyp 02/2008  . AVM (arteriovenous malformation)     Small colonic  . Shortness of breath     Cardiac evaluation, Dr.Katz, pulmonary evaluation, 2012, Dr. Delton Coombes.  Marland Kitchen UTI (lower urinary tract infection)     Possible UTI by history June 14, 2011,   . Gout   . Shoulder  pain, right   . Spinal stenosis   . Hyperlipidemia   . Glaucoma   . Splenomegaly   . Fatty infiltration of liver   . Cystitis   . Takotsubo cardiomyopathy Presumed    a. 07/2012 NSTEMI/Cath (nonobs dzs)/Echo:EF 45-50%, mid-distal antsept AK w/ Gr 1 DD.;  b.  Echo 09/2012: EF 55%, mild LAE  . Dysphagia     a. s/p prior esophageal dilatation. //  Dysarthria and dysphasia hospitalization December, 2013, neurology workup ongoing  . CHF (congestive heart failure)   . Pneumonia     hx of; has not had since taken pneumonia vaccine  . Elevated CPK     Been evaluated with muscle biopsy, February, 2014  . Speech abnormality     January, 2014, question small CVA    Current Outpatient Prescriptions  Medication Sig Dispense Refill  . Ascorbic Acid (VITAMIN C) 500 MG tablet Take 1,000 mg by mouth daily.       Marland Kitchen aspirin 81 MG tablet Take 81 mg by mouth daily.        Marland Kitchen atorvastatin (LIPITOR) 80 MG tablet Take 1 tablet (80 mg total) by mouth daily at 6 PM.  30 tablet  6  . azaTHIOprine (IMURAN) 50 MG tablet Take one tab twice a day      . Cholecalciferol (VITAMIN D) 1000 UNITS capsule Take 1,000 Units by mouth 2 (two) times daily.        . cyanocobalamin 2000 MCG tablet Take 2,000 mcg by mouth daily.       . furosemide (LASIX) 40 MG tablet Take 40 mg by mouth daily.      Marland Kitchen HYDROcodone-acetaminophen (NORCO/VICODIN) 5-325 MG per tablet Take 1 tablet by mouth every 6 (six) hours as needed for pain.  60 tablet  0  . losartan (COZAAR) 50 MG tablet Take 1 tablet (50 mg total) by mouth daily.  30 tablet  6  . metoprolol succinate (TOPROL XL) 25 MG 24 hr tablet Take 1 tablet (25 mg total) by mouth daily. Take with or immediately following a meal.  30 tablet  6  . nitroGLYCERIN (NITROSTAT) 0.4 MG SL tablet Place 1 tablet (0.4 mg total) under the tongue every 5 (five) minutes as needed for chest pain.  25 tablet  6  . pantoprazole (PROTONIX) 40 MG tablet Take 40 mg by mouth daily.      . potassium chloride  (KLOR-CON) 10 MEQ CR tablet Take 2 tablets (20 mEq total) by mouth daily.  60 tablet  3  . predniSONE (DELTASONE) 20 MG tablet Take 25 mg by mouth daily.      Marland Kitchen PROAIR HFA 108 (90 BASE) MCG/ACT inhaler Inhale  2 puffs into the lungs every 6 (six) hours as needed. For shortness breath      . ranitidine (ZANTAC) 150 MG capsule Take 300 mg by mouth every evening.       . trimethoprim (TRIMPEX) 100 MG tablet Take 100 mg by mouth daily.      . [DISCONTINUED] Etanercept (ENBREL [place]) Inject 1 mg into the skin. Once a week        No current facility-administered medications for this visit.    Allergies:    Allergies  Allergen Reactions  . Methotrexate Derivatives Shortness Of Breath and Nausea Only  . Levofloxacin Swelling  . Nitrofurantoin     *MACROBID*  Tongue swelling    Social History:  The patient  reports that she quit smoking about 5 years ago. She has never used smokeless tobacco. She reports that she does not drink alcohol or use illicit drugs.   ROS:  Please see the history of present illness.   She denies wheezing or cough.  No pleuritic CP.  No CP with lying supine.   All other systems reviewed and negative.   PHYSICAL EXAM: VS:  BP 130/72  Pulse 79  Ht 5' 3.5" (1.613 m)  Wt 160 lb (72.576 kg)  BMI 27.89 kg/m2 Well nourished, well developed, in no acute distress HEENT: normal Neck: no JVD (difficult to assess) Cardiac:  normal S1, S2; RRR; no murmur; no rub Lungs:  Decreased breath sounds bilaterally, ? Faint crackles at the bases only Abd: soft, nontender, no hepatomegaly Ext: no edema Skin: warm and dry Neuro:  CNs 2-12 intact, no focal abnormalities noted  EKG:  NSR, HR 79, LAD, low voltage, PRWP, TWI in V2-6 (improved since last tracing)     ASSESSMENT AND PLAN:  1. Dyspnea:  Suspect pulmonary process over cardiac.  CT done today demonstrates lower lob predominant subpleural fibrotic changes.  But no progression from 12/2011.  ? NSIP.  No pericardial effusion on CT.   PFTs pending.  F/u with Dr. Delton Coombes pending as well.  Will obtain BMET and BNP.  She does not appear volume overloaded on exam.  If BNP significantly elevated, adjust Lasix dose (HFpEF). 2. Chest Pain:  ? Related to underlying ILD or lung process.  She just had a cardiac cath.  She had non-obstructive CAD.  I do not think she requires f/u ischemic testing.  If her CP should worsen, we could consider a nuclear scan.  ? If she has some small vessel disease and microvascular ischemia.  I will put her on a trial of Imdur 15 mg QD to see if this helps.   3. Hypertension:  Controlled.  Continue current therapy.  4. CAD:  As above.  Continue ASA and statin. 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 6. Interstitial Lung Disease:  F/u with pulmonology. 7. Hx of Pulmonary Embolism:  She is not tachycardic.  She is not hypoxic.  No need for further evaluation of this.  If dyspnea out of proportion to pulmonary findings, may need to consider VQ scan or contrast CT. 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  9. Disposition:  F/u with Dr. Zackery Barefoot in 2-3 weeks or me on a day he is here.  Luna Glasgow, PA-C  11:34 AM 12/03/2012

## 2012-12-05 ENCOUNTER — Other Ambulatory Visit: Payer: Self-pay | Admitting: Physician Assistant

## 2012-12-05 ENCOUNTER — Ambulatory Visit (HOSPITAL_COMMUNITY): Payer: Medicare Other

## 2012-12-05 LAB — BASIC METABOLIC PANEL
BUN: 15 mg/dL (ref 6–23)
CO2: 29 mEq/L (ref 19–32)
Calcium: 8.8 mg/dL (ref 8.4–10.5)
Chloride: 104 mEq/L (ref 96–112)
Creat: 0.93 mg/dL (ref 0.50–1.10)
Glucose, Bld: 82 mg/dL (ref 70–99)

## 2012-12-08 ENCOUNTER — Ambulatory Visit: Payer: Medicare Other | Admitting: Emergency Medicine

## 2012-12-08 ENCOUNTER — Ambulatory Visit (HOSPITAL_COMMUNITY): Payer: Medicare Other

## 2012-12-09 ENCOUNTER — Telehealth: Payer: Self-pay | Admitting: *Deleted

## 2012-12-09 NOTE — Telephone Encounter (Signed)
pt notified about lab results with verbal understanding today.  

## 2012-12-09 NOTE — Telephone Encounter (Signed)
Message copied by Tarri Fuller on Tue Dec 09, 2012 11:31 AM ------      Message from: [place], [place] T      Created: Mon Dec 08, 2012  8:19 AM       Renal fxn and K+ ok      BNP normal - no CHF      Continue with current treatment plan.      Tereso Newcomer, PA-C  8:19 AM 12/08/2012 ------

## 2012-12-10 ENCOUNTER — Ambulatory Visit (HOSPITAL_COMMUNITY): Payer: Medicare Other

## 2012-12-12 ENCOUNTER — Other Ambulatory Visit (HOSPITAL_COMMUNITY): Payer: Self-pay | Admitting: Nurse Practitioner

## 2012-12-12 ENCOUNTER — Ambulatory Visit (HOSPITAL_COMMUNITY): Payer: Medicare Other

## 2012-12-18 ENCOUNTER — Ambulatory Visit: Payer: Medicare Other | Admitting: Physician Assistant

## 2012-12-18 ENCOUNTER — Telehealth: Payer: Self-pay | Admitting: Emergency Medicine

## 2012-12-18 NOTE — Telephone Encounter (Signed)
Spoke with the pt and she states she cancelled her PFT appt for tomorrow because her mouth has sores all in it and she cannot do the test. I advised the pt to keep her OV appt with Dr. Delton Coombes to be evaluated. Pt states understanding.  Carron Curie, CMA

## 2012-12-19 ENCOUNTER — Other Ambulatory Visit (INDEPENDENT_AMBULATORY_CARE_PROVIDER_SITE_OTHER): Payer: Medicare Other

## 2012-12-19 ENCOUNTER — Encounter: Payer: Self-pay | Admitting: Emergency Medicine

## 2012-12-19 ENCOUNTER — Encounter (HOSPITAL_COMMUNITY): Payer: Medicare Other

## 2012-12-19 ENCOUNTER — Ambulatory Visit (INDEPENDENT_AMBULATORY_CARE_PROVIDER_SITE_OTHER): Payer: Medicare Other | Admitting: Emergency Medicine

## 2012-12-19 VITALS — BP 122/68 | HR 96 | Ht 63.5 in | Wt 165.4 lb

## 2012-12-19 DIAGNOSIS — J849 Interstitial pulmonary disease, unspecified: Secondary | ICD-10-CM

## 2012-12-19 DIAGNOSIS — R0602 Shortness of breath: Secondary | ICD-10-CM

## 2012-12-19 DIAGNOSIS — R112 Nausea with vomiting, unspecified: Secondary | ICD-10-CM

## 2012-12-19 DIAGNOSIS — J8410 Pulmonary fibrosis, unspecified: Secondary | ICD-10-CM

## 2012-12-19 LAB — BASIC METABOLIC PANEL WITH GFR
BUN: 14 mg/dL (ref 6–23)
CO2: 26 meq/L (ref 19–32)
Calcium: 8.2 mg/dL — ABNORMAL LOW (ref 8.4–10.5)
Chloride: 100 meq/L (ref 96–112)
Creatinine, Ser: 0.9 mg/dL (ref 0.4–1.2)
GFR: 64.98 mL/min (ref >60.00)
Glucose, Bld: 95 mg/dL (ref 70–99)
Potassium: 3.5 meq/L (ref 3.5–5.1)
Sodium: 136 meq/L (ref 135–145)

## 2012-12-19 MED ORDER — PROMETHAZINE HCL 12.5 MG PO TABS: 12.5000 mg | ORAL_TABLET | Freq: Four times a day (QID) | ORAL | Status: DC | PRN

## 2012-12-19 NOTE — Progress Notes (Signed)
Subjective:    Patient ID: [name], female    DOB: 1940-12-04, 72 y.o.   MRN: [id number]  HPI 72 yo former smoker, hx carotid disease, RA diagnosed by Dr Nickola Major in June '11, started on MTX in 7/11.  No evidence CAD by myoview in 5/10. Also hx PE in 1988 after a bladder surgery, anti-coag x 56mo.  Referred by Dr Cliffton Asters to evaluate SOB. She tells me that her breathing had some minor limitations up to 2 yrs ago. This improved some when she was treated for Vit D and B12 deficiency. Then became much more bothersome Summer 2010 when she started Prednisone, followed by MTX for her RA. She has gained about 30 lbs since the Summer. She believes that the SOB relates to the initiation of these meds. She was started on Spiriva 12/11 ago - no difference in her breathing.   ROV 08/23/10 -- Hx tobacco, follows up for evaluation of SOB. We performed CBC that showed no anemia, CXR without evidence for ILD. Tells me that MTX was stopped last Friday, still on pred. Having some nasal congestion today. Her breathing is still difficult w exertion. We stopped Spiriva last time, she doesn't miss it. Not on any other BDs.   ROV 01/03/11 -- hx tobacco, RA on MTX. CAD being followed by Dr Myrtis Ser. L and R cath in 2/12 showed CAD, normal LV and RV pressures. Has restriction vs mixed disease - did not improve with Spiriva and albuterol. Has been off prednisone since January. She had a CT scan chest that showed very subtle subpleural interstitial changes.   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 Also mixed dz on PFT but no clinical response to Spiriva + SABA. Returns for f/u CT scan chest done 06/28/11. She tells me that she has been more SOB since last visit, was admitted to hosp 10/12 for dyspnea and chest pain, was felt to be volume overloaded and responded to diuretics - improved resp status. Her LV fxn has been normal by TTE, has diastolic dysfxn and A Fib as potential causes (?  Related to Embrel also - started 3-4 months ago). She remains on MTX. Her CT scan shows progression of sub-pleural fibrosis. Her MTX was increased last week - she isn't certain that the MTX has every helped her.   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 Also mixed dz on PFT but no clinical response to Spiriva + SABA. Her Embrel was stopped due to CHF exacerbation, but she is still on MTX. Her breathing hasn't really changed very much off the Embrel. She was temporarily off of MTX for three weeks, thought that her breathing was better. Her last CT scan 11/12 showed subpleural ILD, ? Related to MTX.   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 Also mixed dz on PFT but no clinical response to Spiriva + SABA. Her MTX was stopped by Dr Nickola Major 10/2011. She continues to have low energy. Breathing is about the same, still able to walk 2 miles every day. Last TTE was 2011.   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 Also mixed dz on PFT but no clinical response to Spiriva + SABA. Repeat TTE 4/24 showed estimated RVSP . She is still able to walk her two miles, but she has dyspnea with chores,  sweeping, etc. Her wt has gone up over the last year, probably 7-8 lbs.   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 Also mixed dz on PFT but no clinical response to Spiriva + SABA.  Returns after repeat CT scan of the chest to eval her ILD and nodules >> stable. She tells me today that her breathing has been stable to improved.  She was started on Pred for the last 2 weeks for joint pain - also seems to have made her breathing better.   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 Also mixed dz on PFT but no clinical response to Spiriva + SABA. Last time discussed retrying albuterol for  DOE. She tells me that since last visit she was admitted for NSTEMI. L cath done without intervention. 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   OV 11/20/12 (MR) Acute visit for Dr. Delton Coombes patient with NEW PROBLEM OF DYSPNEA.  Has rheumatoid arthritis with interstitial lung disease not otherwise specified. She has been off methotrexate and Enbrel for over a year. For the last several months she's had generalized muscle weakness. 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 Then 3 weeks ago was started on prednisone 60 mg once a day. Since then 15 pound weight gain and insidious onset of dyspnea that is progressively worse. At baseline she can walk one or 2 miles. However, now she is unable to do activities of daily living without dyspnea. Dyspnea is relieved by rest and rated as moderate in severity. There is no associated cough or chest pain but that is associated weight gain. 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 Seen as above by Dr MR for progressive dyspnea. Repeat CT scan performed and stable as below. She has not had full PFT yet. 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 She has gained more wt - 30 lbs over last 3 months. She c/o nausea, exertional dyspnea. Feels like she is going to "pass out". The sx correspond with the increase in imuran.    CT chest 12/03/12 --  Comparison: 07/26/2012 and 01/03/2012.  Findings: No pathologically enlarged mediastinal, hilar or axillary  lymph nodes. Coronary artery calcification. Heart size normal.  No pericardial effusion.  Biapical pleural parenchymal scarring. Subpleural linear band of  opacity parallels the chest wall in the lower lobes bilaterally.  There is some associated subpleural ground-glass in the lower  lobes, right middle lobe and lingula.  Findings are similar to  01/03/2012. No honeycombing. There may be minimal traction  bronchiolectasis. No air trapping on inspiratory expiratory  imaging. No pleural fluid. Airway is otherwise unremarkable.  Incidental imaging of the upper abdomen shows no acute findings.  Spleen appears prominent but is incompletely imaged. No worrisome  lytic or sclerotic lesions.  IMPRESSION:  1. Lower lobe predominant subpleural fibrotic changes, as  described above, without interval progression from 01/03/2012.  Differential diagnosis includes nonspecific interstitial  pneumonitis (NSIP) as well with usual interstitial pneumonitis  (UIP), although honeycombing is absent.  2. Coronary artery calcification.  3. Spleen appears prominent but is incompletely imaged.    Review of Systems  Constitutional: Positive for unexpected weight change. Negative for fever.  HENT: Negative for ear  pain, nosebleeds, congestion, sore throat, rhinorrhea, sneezing, trouble swallowing, dental problem, postnasal drip and sinus pressure.   Eyes: Negative for redness and itching.  Respiratory: Positive for shortness of breath. Negative for cough, chest tightness and wheezing.   Cardiovascular: Negative for palpitations and leg swelling.  Gastrointestinal: Negative for nausea and vomiting.  Genitourinary: Negative for dysuria.  Musculoskeletal: Negative for joint swelling.  Skin: Negative for rash.  Neurological: Negative for headaches.  Hematological: Does not bruise/bleed easily.  Psychiatric/Behavioral: Negative for dysphoric mood. The patient is not nervous/anxious.        Objective:   Physical Exam Filed Vitals:   12/19/12 1441  BP: 122/68  Pulse: 96  Height: 5' 3.5" (1.613 m)  Weight: 165 lb 6.4 oz (75.025 kg)  SpO2: 100%   Gen: Pleasant, obese, in no distress,  normal affect; she has gained wt since last visit  ENT: No lesions,  mouth clear,  oropharynx clear, no postnasal drip; cushingoid  faces  Neck: No JVD, no TMG, no carotid bruits  Lungs: R > L basilar insp crackles, clear superiorly  Cardiovascular: RRR, heart sounds normal, no murmur or gallops, no LE edema  Musculoskeletal: No deformities, no cyanosis or clubbing  Neuro: alert, non focal  Skin: Warm, no lesions or rashes     Assessment & Plan:  Shortness of breath I believe her dyspnea, nausea, weakness, wt gain all relate to her med regimen for her RA and now inclusion body myositis. She is on cytoxan and prednisone. Has gained 30 lbs. Her CT scan shows that her ILD is stable compared with 12/2011. She hasn't has PFT because she has mouth sores.  - I have asked her to review her side effects with dr Nickola Major - will defer BD's for now, believe she needs to get full PFT first - follow in 2 months  Interstitial lung disease Stable by CT scan chest compared with 12/2011  Nausea with vomiting Likely due to cytoxan.  - phenergan ordered - check BMP

## 2012-12-19 NOTE — Assessment & Plan Note (Addendum)
Likely due to cytoxan.  - phenergan ordered - check BMP

## 2012-12-19 NOTE — Assessment & Plan Note (Signed)
Stable by CT scan chest compared with 12/2011

## 2012-12-19 NOTE — Patient Instructions (Addendum)
Your CT scan of the chest is stable We will need to reschedule your pulmonary function testing when you are ready to do so Take phenergan as needed for nausea Discuss your side effects with Dr Nickola Major. She may decide to adjust your medications.  Follow with Dr Delton Coombes in 2 months or sooner if you have any problems.

## 2012-12-19 NOTE — Assessment & Plan Note (Signed)
I believe her dyspnea, nausea, weakness, wt gain all relate to her med regimen for her RA and now inclusion body myositis. She is on cytoxan and prednisone. Has gained 30 lbs. Her CT scan shows that her ILD is stable compared with 12/2011. She hasn't has PFT because she has mouth sores.  - I have asked her to review her side effects with dr Nickola Major - will defer BD's for now, believe she needs to get full PFT first - follow in 2 months

## 2012-12-26 ENCOUNTER — Ambulatory Visit (INDEPENDENT_AMBULATORY_CARE_PROVIDER_SITE_OTHER): Payer: Medicare Other | Admitting: Cardiology

## 2012-12-26 VITALS — BP 140/80 | HR 100 | Ht 63.0 in | Wt 164.0 lb

## 2012-12-26 DIAGNOSIS — G7241 Inclusion body myositis [IBM]: Secondary | ICD-10-CM | POA: Insufficient documentation

## 2012-12-26 DIAGNOSIS — I2510 Atherosclerotic heart disease of native coronary artery without angina pectoris: Secondary | ICD-10-CM

## 2012-12-26 DIAGNOSIS — K1379 Other lesions of oral mucosa: Secondary | ICD-10-CM

## 2012-12-26 DIAGNOSIS — K1370 Unspecified lesions of oral mucosa: Secondary | ICD-10-CM

## 2012-12-26 DIAGNOSIS — R0602 Shortness of breath: Secondary | ICD-10-CM

## 2012-12-26 MED ORDER — FLUOCINONIDE 0.05 % EX GEL: Freq: Two times a day (BID) | CUTANEOUS | Status: DC

## 2012-12-26 NOTE — Patient Instructions (Addendum)
**Note De-identified  Obfuscation** Your physician recommends that you continue on your current medications as directed. Please refer to the Current Medication list given to you today.  Your physician wants you to follow-up in: 6 months. You will receive a reminder letter in the mail two months in advance. If you don't receive a letter, please call our office to schedule the follow-up appointment.  

## 2012-12-26 NOTE — Progress Notes (Signed)
HPI  Patient is seen today for followup evaluation of shortness of breath. I saw her last in February, 2014. Since that time she has had a diagnosis made concerning her elevated CPK. She is receiving some treatment for this. She was seen in the office on December 03, 2012. She had some shortness of breath and some chest discomfort at that time. We know that her recent cath was stable. She has interstitial lung disease. The diagnosis on her muscle biopsy is inclusion body myositis. She was on prednisone. A chest CT was done on that day. She had predominant subpleural fibrotic changes. There was no definite change from May, 2013. There was no significant pericardial effusion. It was known at that time that she would have followup with the pulmonary team. Decision was made to watch her overall coronary status.  Since that time she has been seen by Dr. Delton Coombes of the pulmonary team. He was concerned that some of her medication for her myositis might be causing some of her symptoms.  Today she is here with multiple complaints about her medications. She has sores in her mouth. She is asked me to write a prescription for a gel for this.  Allergies  Allergen Reactions  . Methotrexate Derivatives Shortness Of Breath and Nausea Only  . Levofloxacin Swelling  . Nitrofurantoin     *MACROBID*  Tongue swelling    Current Outpatient Prescriptions  Medication Sig Dispense Refill  . Ascorbic Acid (VITAMIN C) 500 MG tablet Take 1,000 mg by mouth daily.       Marland Kitchen aspirin 81 MG tablet Take 81 mg by mouth daily.        Marland Kitchen atorvastatin (LIPITOR) 80 MG tablet Take 1 tablet (80 mg total) by mouth daily at 6 PM.  30 tablet  6  . azaTHIOprine (IMURAN) 50 MG tablet Take 50 mg by mouth. Take 2 tablets in the AM and 1 tablet in the PM.      . Cholecalciferol (VITAMIN D) 1000 UNITS capsule Take 1,000 Units by mouth 2 (two) times daily.        . cyanocobalamin 2000 MCG tablet Take 2,000 mcg by mouth daily.       . furosemide  (LASIX) 40 MG tablet Take 40 mg by mouth daily.      Marland Kitchen HYDROcodone-acetaminophen (NORCO/VICODIN) 5-325 MG per tablet Take 1 tablet by mouth every 6 (six) hours as needed for pain.  60 tablet  0  . isosorbide mononitrate (IMDUR) 30 MG 24 hr tablet Take 0.5 tablets (15 mg total) by mouth daily.  30 tablet  11  . KLOR-CON 10 10 MEQ tablet TAKE 2 TABLETS (20 MEQ TOTAL) BY MOUTH DAILY.  60 tablet  3  . losartan (COZAAR) 50 MG tablet Take 1 tablet (50 mg total) by mouth daily.  30 tablet  6  . metoprolol succinate (TOPROL XL) 25 MG 24 hr tablet Take 1 tablet (25 mg total) by mouth daily. Take with or immediately following a meal.  30 tablet  6  . nitroGLYCERIN (NITROSTAT) 0.4 MG SL tablet Place 1 tablet (0.4 mg total) under the tongue every 5 (five) minutes as needed for chest pain.  25 tablet  6  . pantoprazole (PROTONIX) 40 MG tablet Take 40 mg by mouth daily.      . predniSONE (DELTASONE) 20 MG tablet Take 25 mg by mouth daily.      Marland Kitchen PROAIR HFA 108 (90 BASE) MCG/ACT inhaler Inhale 2 puffs into the lungs every  6 (six) hours as needed. For shortness breath      . promethazine (PHENERGAN) 12.5 MG tablet Take 1 tablet (12.5 mg total) by mouth every 6 (six) hours as needed for nausea.  15 tablet  0  . ranitidine (ZANTAC) 150 MG capsule Take 300 mg by mouth every evening.       . trimethoprim (TRIMPEX) 100 MG tablet Take 100 mg by mouth daily.      . fluocinonide gel (LIDEX) 0.05 % Apply topically 2 (two) times daily.  60 g  1  . [DISCONTINUED] Etanercept (ENBREL [place]) Inject 1 mg into the skin. Once a week        No current facility-administered medications for this visit.    History   Social History  . Marital Status: Widowed    Spouse Name: N/A    Number of Children: 2  . Years of Education: N/A   Occupational History  . retired    Social History Main Topics  . Smoking status: Former Smoker -- 1.00 packs/day for 30 years    Quit date: 08/14/2007  . Smokeless tobacco: Never Used  . Alcohol  Use: No  . Drug Use: No  . Sexually Active: Not on file   Other Topics Concern  . Not on file   Social History Narrative   Single widow   Alcohol Use - no   Retired from [employer]   Patient is a former smoker. -stopped about 3 years    Illicit Drug Use - no   Patient does not get regular exercise.     Family History  Problem Relation Age of Onset  . Arthritis Brother   . Colon cancer Paternal Grandmother   . Lung cancer Father     x 3 uncles and a aunt  . Brain cancer Father   . Heart disease Mother   . Kidney disease Mother     chronic UTI  . Diabetes Mother   . Diabetes Sister   . Ovarian cancer Maternal Aunt   . Heart disease Maternal Uncle   . Kidney cancer Maternal Uncle   . Heart disease Paternal Aunt   . Pancreatic cancer Maternal Grandmother     Past Medical History  Diagnosis Date  . Pulmonary embolism     a. 07/2012 CTA chest negative for PE.  Marland Kitchen Rheumatoid arthritis   . Hypertension   . Incontinence of feces   . Unspecified disorder of bladder   . Rectal prolapse   . Hiatal hernia   . IBS (irritable bowel syndrome)   . GERD (gastroesophageal reflux disease)   . Pain in limb   . Lumbago     hospital admission with back pain in April, 2011 repair of a disc herniation L4-L5   . Osteoarthrosis, unspecified whether generalized or localized, unspecified site   . COPD (chronic obstructive pulmonary disease)   . Tobacco use disorder   . CAD (coronary artery disease) Non-obstructive    a. Nuclear, May, 2010, no ischemia;  b. cath 2012, nonobstructive CAD ( 40% circumflex, 40% LAD) EF normal;  c. 07/2012 NSTEMI/Cath: nonobs dzs, EF 40% w/ periapical AK.  . Carotid artery disease     a. Doppler, July, 2010, 40-59% bilateral, b. Dopplers 6/13:  40-59% bilat (f/u  01/2014)  . Adenomatous polyp 02/2008  . AVM (arteriovenous malformation)     Small colonic  . Shortness of breath     Cardiac evaluation, Dr.Wrigley Plasencia, pulmonary evaluation, 2012, Dr. Delton Coombes.  Marland Kitchen UTI  (  lower urinary tract infection)     Possible UTI by history June 14, 2011,   . Gout   . Shoulder pain, right   . Spinal stenosis   . Hyperlipidemia   . Glaucoma   . Splenomegaly   . Fatty infiltration of liver   . Cystitis   . Takotsubo cardiomyopathy Presumed    a. 07/2012 NSTEMI/Cath (nonobs dzs)/Echo:EF 45-50%, mid-distal antsept AK w/ Gr 1 DD.;  b.  Echo 09/2012: EF 55%, mild LAE  . Dysphagia     a. s/p prior esophageal dilatation. //  Dysarthria and dysphasia hospitalization December, 2013, neurology workup ongoing  . CHF (congestive heart failure)   . Pneumonia     hx of; has not had since taken pneumonia vaccine  . Elevated CPK     Been evaluated with muscle biopsy, February, 2014  . Speech abnormality     January, 2014, question small CVA    Past Surgical History  Procedure Laterality Date  . Laminectomy      lumbar, HX of L4-5 x 2  . Cervical disc compression      c5-c6 Hx of  . Appendectomy    . Cholecystectomy    . Bladder surgery      x 2  . Vaginal hysterectomy    . Rectocele repair    . Interstim medronic stimulator  04/2010  . Back surgery      x 2  . Eye surgery      bilateral cataract removal  . Cardiac catheterization  07/28/12    no PCI  . Colonoscopy    . Muscle biopsy  09/17/2012    Procedure: GASTROCNEMIUS MUSCLE BIOPSY;  Surgeon: Carmela Hurt, MD;  Location: [facility];  Service: Neurosurgery;  Laterality: Right;  RIGHT DELTOID muscle biopsy    Patient Active Problem List   Diagnosis Date Noted  . Shortness of breath     Priority: High  . Ejection fraction     Priority: High  . Carotid artery disease     Priority: High  . Hypertension     Priority: High  . CAD (coronary artery disease)     Priority: High  . Nausea with vomiting 12/19/2012  . Dyspnea 11/25/2012  . Speech abnormality   . Elevated CPK   . Dysphagia   . Takotsubo cardiomyopathy   . Interstitial lung disease 07/02/2011  . UTI (lower urinary tract infection)   .  Pulmonary embolism   . Rheumatoid arthritis   . COPD (chronic obstructive pulmonary disease)   . Tobacco use disorder   . AVM (arteriovenous malformation)   . PREMATURE VENTRICULAR CONTRACTIONS 10/11/2010  . OSTEOARTHRITIS 07/24/2010  . HEMORRHOIDS-INTERNAL 07/24/2010  . ANGIODYSPLASIA-INTESTINE 07/24/2010  . ABDOMINAL PAIN, LEFT UPPER QUADRANT 07/24/2010  . PERSONAL HISTORY OF COLONIC POLYPS 07/24/2010  . Other acquired absence of organ 11/16/2008  . RECTAL PROLAPSE 02/10/2008  . BLADDER PROLAPSE 02/10/2008  . INCONTINENCE OF FECES 02/10/2008  . GASTROESOPHAGEAL REFLUX DISEASE 02/09/2008  . HIATAL HERNIA 02/09/2008  . IRRITABLE BOWEL SYNDROME 02/09/2008    ROS   Patient denies fever, chills, headache, sweats, rash, change in vision, change in hearing, chest pain, cough, nausea vomiting, urinary symptoms. All other systems are reviewed and are negative.  PHYSICAL EXAM   Patient is stable. She's had a change in her facial features consistent with prednisone therapy. She is oriented to person time and place. She is uncomfortable with her mouth sores. Lungs reveal scattered rhonchi. There is no respiratory  distress. Cardiac exam reveals S1 and S2. There no clicks or significant murmurs. The abdomen is soft. The patient is overweight. There is no peripheral edema.  Filed Vitals:   12/26/12 1025  BP: 140/80  Pulse: 100  Height: 5\' 3"  (1.6 m)  Weight: 164 lb (74.39 kg)    ASSESSMENT & PLAN

## 2012-12-26 NOTE — Assessment & Plan Note (Signed)
Patient had only minimal coronary disease in the past. Her EF improved to 50% after her episode of Takotsubo syndrome in December, 2013. She will need a followup echo in the future.

## 2012-12-26 NOTE — Assessment & Plan Note (Addendum)
At this point I feel that her shortness of breath is not related to her cardiac status. No further workup at this time. The pulmonary team feels that some of her symptoms may be related to her medications for her myositis and her rheumatoid arthritis.

## 2012-12-26 NOTE — Assessment & Plan Note (Signed)
Patient has been on steroids and other meds for this. She is not tolerating some of her medicines.

## 2013-01-09 ENCOUNTER — Ambulatory Visit: Payer: Medicare Other | Admitting: Cardiology

## 2013-02-03 ENCOUNTER — Other Ambulatory Visit: Payer: Self-pay | Admitting: Cardiology

## 2013-02-19 ENCOUNTER — Ambulatory Visit: Payer: Medicare Other | Admitting: Emergency Medicine

## 2013-03-05 ENCOUNTER — Encounter: Payer: Self-pay | Admitting: Gastroenterology

## 2013-03-09 ENCOUNTER — Encounter: Payer: Self-pay | Admitting: Gastroenterology

## 2013-03-11 ENCOUNTER — Ambulatory Visit: Payer: Medicare Other | Admitting: Emergency Medicine

## 2013-03-11 ENCOUNTER — Other Ambulatory Visit: Payer: Self-pay

## 2013-03-11 MED ORDER — PANTOPRAZOLE SODIUM 40 MG PO TBEC: 40.0000 mg | DELAYED_RELEASE_TABLET | Freq: Every day | ORAL | Status: DC

## 2013-03-12 ENCOUNTER — Other Ambulatory Visit: Payer: Self-pay

## 2013-03-12 MED ORDER — LOSARTAN POTASSIUM 50 MG PO TABS: 50.0000 mg | ORAL_TABLET | Freq: Every day | ORAL | Status: DC

## 2013-03-12 MED ORDER — POTASSIUM CHLORIDE ER 10 MEQ PO TBCR: EXTENDED_RELEASE_TABLET | ORAL | Status: AC

## 2013-03-12 MED ORDER — METOPROLOL SUCCINATE ER 25 MG PO TB24: 25.0000 mg | ORAL_TABLET | Freq: Every day | ORAL | Status: DC

## 2013-03-18 ENCOUNTER — Other Ambulatory Visit: Payer: Self-pay

## 2013-03-30 ENCOUNTER — Telehealth: Payer: Self-pay | Admitting: Emergency Medicine

## 2013-03-30 NOTE — Telephone Encounter (Signed)
Pt has been scheduled with RB on 04/02/13 at 2:30pm.

## 2013-04-02 ENCOUNTER — Encounter: Payer: Self-pay | Admitting: Emergency Medicine

## 2013-04-02 ENCOUNTER — Ambulatory Visit (INDEPENDENT_AMBULATORY_CARE_PROVIDER_SITE_OTHER): Payer: Medicare Other | Admitting: Emergency Medicine

## 2013-04-02 VITALS — BP 130/60 | HR 104 | Temp 98.1°F | Ht 63.5 in | Wt 166.6 lb

## 2013-04-02 DIAGNOSIS — R0609 Other forms of dyspnea: Secondary | ICD-10-CM

## 2013-04-02 DIAGNOSIS — R0600 Dyspnea, unspecified: Secondary | ICD-10-CM

## 2013-04-02 NOTE — Patient Instructions (Addendum)
Please continue your prednisone and Arava as ordered As you taper off prednisone we will need to work hard on weight loss We will perform breathing tests in the future when your mouth sores are improved.  We will check a CXR next visit Follow with Dr Delton Coombes in 6 months or sooner if you have any problems

## 2013-04-02 NOTE — Progress Notes (Signed)
Subjective:    Patient ID: [name], female    DOB: 1940/10/02, 72 y.o.   MRN: [id number]  HPI 72 yo former smoker, hx carotid disease, RA diagnosed by Dr Nickola Major in June '11, started on MTX in 7/11.  No evidence CAD by myoview in 5/10. Also hx PE in 1988 after a bladder surgery, anti-coag x 39mo.  Referred by Dr Cliffton Asters to evaluate SOB. She tells me that her breathing had some minor limitations up to 2 yrs ago. This improved some when she was treated for Vit D and B12 deficiency. Then became much more bothersome Summer 2010 when she started Prednisone, followed by MTX for her RA. She has gained about 30 lbs since the Summer. She believes that the SOB relates to the initiation of these meds. She was started on Spiriva 12/11 ago - no difference in her breathing.   ROV 08/23/10 -- Hx tobacco, follows up for evaluation of SOB. We performed CBC that showed no anemia, CXR without evidence for ILD. Tells me that MTX was stopped last Friday, still on pred. Having some nasal congestion today. Her breathing is still difficult w exertion. We stopped Spiriva last time, she doesn't miss it. Not on any other BDs.   ROV 01/03/11 -- hx tobacco, RA on MTX. CAD being followed by Dr Myrtis Ser. L and R cath in 2/12 showed CAD, normal LV and RV pressures. Has restriction vs mixed disease - did not improve with Spiriva and albuterol. Has been off prednisone since January. She had a CT scan chest that showed very subtle subpleural interstitial changes.   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 Also mixed dz on PFT but no clinical response to Spiriva + SABA. Returns for f/u CT scan chest done 06/28/11. She tells me that she has been more SOB since last visit, was admitted to hosp 10/12 for dyspnea and chest pain, was felt to be volume overloaded and responded to diuretics - improved resp status. Her LV fxn has been normal by TTE, has diastolic dysfxn and A Fib as potential causes (?  Related to Embrel also - started 3-4 months ago). She remains on MTX. Her CT scan shows progression of sub-pleural fibrosis. Her MTX was increased last week - she isn't certain that the MTX has every helped her.   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 Also mixed dz on PFT but no clinical response to Spiriva + SABA. Her Embrel was stopped due to CHF exacerbation, but she is still on MTX. Her breathing hasn't really changed very much off the Embrel. She was temporarily off of MTX for three weeks, thought that her breathing was better. Her last CT scan 11/12 showed subpleural ILD, ? Related to MTX.   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 Also mixed dz on PFT but no clinical response to Spiriva + SABA. Her MTX was stopped by Dr Nickola Major 10/2011. She continues to have low energy. Breathing is about the same, still able to walk 2 miles every day. Last TTE was 2011.   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 Also mixed dz on PFT but no clinical response to Spiriva + SABA. Repeat TTE 4/24 showed estimated RVSP . She is still able to walk her two miles, but she has dyspnea with chores,  sweeping, etc. Her wt has gone up over the last year, probably 7-8 lbs.   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 Also mixed dz on PFT but no clinical response to Spiriva + SABA.  Returns after repeat CT scan of the chest to eval her ILD and nodules >> stable. She tells me today that her breathing has been stable to improved.  She was started on Pred for the last 2 weeks for joint pain - also seems to have made her breathing better.   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 Also mixed dz on PFT but no clinical response to Spiriva + SABA. Last time discussed retrying albuterol for  DOE. She tells me that since last visit she was admitted for NSTEMI. L cath done without intervention. 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   OV 11/20/12 (MR) Acute visit for Dr. Delton Coombes patient with NEW PROBLEM OF DYSPNEA.  Has rheumatoid arthritis with interstitial lung disease not otherwise specified. She has been off methotrexate and Enbrel for over a year. For the last several months she's had generalized muscle weakness. 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 Then 3 weeks ago was started on prednisone 60 mg once a day. Since then 15 pound weight gain and insidious onset of dyspnea that is progressively worse. At baseline she can walk one or 2 miles. However, now she is unable to do activities of daily living without dyspnea. Dyspnea is relieved by rest and rated as moderate in severity. There is no associated cough or chest pain but that is associated weight gain. 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 Seen as above by Dr MR for progressive dyspnea. Repeat CT scan performed and stable as below. She has not had full PFT yet. 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 She has gained more wt - 30 lbs over last 3 months. She c/o nausea, exertional dyspnea. Feels like she is going to "pass out". The sx correspond with the increase in imuran.   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 Hx of CAD and an improved Takotsubo CM (last EF 50%). Hx remote PE. Has had trouble with more dyspnea since March '14. This seemed to associate with medication changes and wt gain. She has been taken off imuran, started back on pred, Arava - now following w Dr Jon Billings. Her wt has been stable since last time. Less peripheral edema  compared with last time. Her energy level is better today, SOB is the same and possibly worse than last visit.     CT chest 12/03/12 --  Comparison: 07/26/2012 and 01/03/2012.  Findings: No pathologically enlarged mediastinal, hilar or axillary  lymph nodes. Coronary artery calcification. Heart size normal.  No pericardial effusion.  Biapical pleural parenchymal scarring. Subpleural linear band of  opacity parallels the chest wall in the lower lobes bilaterally.  There is some associated subpleural ground-glass in the lower  lobes, right middle lobe and lingula. Findings are similar to  01/03/2012. No honeycombing. There may be minimal traction  bronchiolectasis. No air trapping on inspiratory expiratory  imaging. No pleural fluid. Airway is  otherwise unremarkable.  Incidental imaging of the upper abdomen shows no acute findings.  Spleen appears prominent but is incompletely imaged. No worrisome  lytic or sclerotic lesions.  IMPRESSION:  1. Lower lobe predominant subpleural fibrotic changes, as  described above, without interval progression from 01/03/2012.  Differential diagnosis includes nonspecific interstitial  pneumonitis (NSIP) as well with usual interstitial pneumonitis  (UIP), although honeycombing is absent.  2. Coronary artery calcification.  3. Spleen appears prominent but is incompletely imaged.    Review of Systems  Constitutional: Positive for unexpected weight change. Negative for fever.  HENT: Negative for ear pain, nosebleeds, congestion, sore throat, rhinorrhea, sneezing, trouble swallowing, dental problem, postnasal drip and sinus pressure.   Eyes: Negative for redness and itching.  Respiratory: Positive for shortness of breath. Negative for cough, chest tightness and wheezing.   Cardiovascular: Negative for palpitations and leg swelling.  Gastrointestinal: Negative for nausea and vomiting.  Genitourinary: Negative for dysuria.  Musculoskeletal: Negative for  joint swelling.  Skin: Negative for rash.  Neurological: Negative for headaches.  Hematological: Does not bruise/bleed easily.  Psychiatric/Behavioral: Negative for dysphoric mood. The patient is not nervous/anxious.        Objective:   Physical Exam Filed Vitals:   04/02/13 1418  BP: 130/60  Pulse: 104  Temp: 98.1 F (36.7 C)  TempSrc: Oral  Height: 5' 3.5" (1.613 m)  Weight: 166 lb 9.6 oz (75.569 kg)  SpO2: 95%   Gen: Pleasant, obese, in no distress,  normal affect; she has gained wt since last visit  ENT: No lesions,  mouth clear,  oropharynx clear, no postnasal drip; cushingoid faces  Neck: No JVD, no TMG, no carotid bruits  Lungs: R > L basilar insp crackles, clear superiorly  Cardiovascular: RRR, heart sounds normal, no murmur or gallops, no LE edema  Musculoskeletal: No deformities, no cyanosis or clubbing  Neuro: alert, non focal  Skin: Warm, no lesions or rashes     Assessment & Plan:  Dyspnea Multifactorial - underlying ILD, wt gain, overall status from her immunosuppression meds. No evidence of volume overload - PFT's whenever she can perform. I doubt significant AFL - CXR next time

## 2013-04-02 NOTE — Assessment & Plan Note (Signed)
Multifactorial - underlying ILD, wt gain, overall status from her immunosuppression meds. No evidence of volume overload - PFT's whenever she can perform. I doubt significant AFL - CXR next time

## 2013-04-25 ENCOUNTER — Other Ambulatory Visit: Payer: Self-pay | Admitting: Cardiology

## 2013-04-28 ENCOUNTER — Ambulatory Visit (AMBULATORY_SURGERY_CENTER): Payer: Self-pay | Admitting: *Deleted

## 2013-04-28 VITALS — Ht 63.5 in | Wt 166.0 lb

## 2013-04-28 DIAGNOSIS — Z86010 Personal history of colonic polyps: Secondary | ICD-10-CM

## 2013-04-28 MED ORDER — MOVIPREP 100 G PO SOLR: 1.0000 | Freq: Once | ORAL | Status: DC

## 2013-04-28 NOTE — Progress Notes (Signed)
Denies allergies to eggs or soy products. Denies complications with sedation or anesthesia. 

## 2013-05-12 ENCOUNTER — Ambulatory Visit (AMBULATORY_SURGERY_CENTER): Payer: Medicare Other | Admitting: Gastroenterology

## 2013-05-12 ENCOUNTER — Encounter: Payer: Self-pay | Admitting: Gastroenterology

## 2013-05-12 VITALS — BP 121/68 | HR 75 | Temp 98.4°F | Resp 19 | Ht 63.0 in | Wt 166.0 lb

## 2013-05-12 DIAGNOSIS — D126 Benign neoplasm of colon, unspecified: Secondary | ICD-10-CM

## 2013-05-12 DIAGNOSIS — K5520 Angiodysplasia of colon without hemorrhage: Secondary | ICD-10-CM

## 2013-05-12 DIAGNOSIS — Z86010 Personal history of colonic polyps: Secondary | ICD-10-CM

## 2013-05-12 MED ORDER — SODIUM CHLORIDE 0.9 % IV SOLN: 500.0000 mL | INTRAVENOUS | Status: DC

## 2013-05-12 NOTE — Progress Notes (Addendum)
Patient did not have preoperative order for IV antibiotic SSI prophylaxis. (G8918)  Patient did not experience any of the following events: a burn prior to discharge; a fall within the facility; wrong site/side/patient/procedure/implant event; or a hospital transfer or hospital admission upon discharge from the facility. (G8907)  

## 2013-05-12 NOTE — Progress Notes (Signed)
Called to room to assist during endoscopic procedure.  Patient ID and intended procedure confirmed with present staff. Received instructions for my participation in the procedure from the performing physician.  

## 2013-05-12 NOTE — Op Note (Addendum)
 [facility] [address] [place], [address]   COLONOSCOPY PROCEDURE REPORT PATIENT: [name], [name].  MR#: [id number] BIRTHDATE: 05-24-1941 , 72  yrs. old GENDER: Female ENDOSCOPIST: Meryl Dare, MD, [facility] PROCEDURE DATE:  05/12/2013 PROCEDURE:   Colonoscopy with snare polypectomy First Screening Colonoscopy - Avg.  risk and is 50 yrs.  old or older - No.  Prior Negative Screening - Now for repeat screening. N/A  History of Adenoma - Now for follow-up colonoscopy & has been > or = to 3 yrs.  Yes hx of adenoma.  Has been 3 or more years since last colonoscopy.  Polyps Removed Today? Yes. ASA CLASS:   Class III INDICATIONS:Patient's personal history of adenomatous colon polyps.  MEDICATIONS: MAC sedation, administered by CRNA and propofol (Diprivan) 400mg  IV DESCRIPTION OF PROCEDURE:   After the risks benefits and alternatives of the procedure were thoroughly explained, informed consent was obtained.  A digital rectal exam revealed no abnormalities of the rectum.   The LB [id number]  endoscope was introduced through the anus and advanced to the cecum, which was identified by both the appendix and ileocecal valve. No adverse events experienced.   The quality of the prep was excellent, using MoviPrep  The instrument was then slowly withdrawn as the colon was fully examined.  COLON FINDINGS: 2 small, nonbleeding AVMs in the cecum, measuring 2-3 mm. A sessile polyp measuring 8 mm in size was found in the ascending colon.  A polypectomy was performed with a cold snare. The resection was complete and the polyp tissue was completely retrieved.   A sessile polyp measuring 5 mm in size was found in the sigmoid colon.  A polypectomy was performed with a cold snare. The resection was complete and the polyp tissue was completely retrieved.   The colon was otherwise normal.  There was no diverticulosis, inflammation, polyps or cancers unless previously stated.   Retroflexed views revealed small internal hemorrhoids. The time to cecum=1 minutes 51 seconds.  Withdrawal time=8 minutes 33 seconds.  The scope was withdrawn and the procedure completed. COMPLICATIONS: There were no complications.  ENDOSCOPIC IMPRESSION: 1.   Sessile polyp measuring 8 mm in the ascending colon; polypectomy performed with a cold snare 2.   Sessile polyp measuring 5 mm in the sigmoid colon; polypectomy performed with a cold snare 3.   Two cecal AVMs 4.   Small internal hemorrhoids  RECOMMENDATIONS: 1.  Await pathology results 2.  Repeat Colonoscopy in 5 years.  eSigned:  Meryl Dare, MD, [facility] 05/12/2013 11:26 AM Revised: 05/12/2013 11:26 AM cc: Laurann Montana, MD

## 2013-05-12 NOTE — Patient Instructions (Addendum)

## 2013-05-13 ENCOUNTER — Telehealth: Payer: Self-pay | Admitting: *Deleted

## 2013-05-13 NOTE — Telephone Encounter (Signed)
  Follow up Call-  Call back number 05/12/2013 05/15/2012  Post procedure Call Back phone  # [phone]  Permission to leave phone message Yes Yes    The name on the machine didn't match to pt's name- no message left

## 2013-05-21 ENCOUNTER — Encounter: Payer: Self-pay | Admitting: Gastroenterology

## 2013-06-11 ENCOUNTER — Ambulatory Visit: Payer: Medicare Other | Admitting: Physician Assistant

## 2013-06-15 ENCOUNTER — Encounter: Payer: Self-pay | Admitting: Physician Assistant

## 2013-06-15 ENCOUNTER — Ambulatory Visit (INDEPENDENT_AMBULATORY_CARE_PROVIDER_SITE_OTHER): Payer: Medicare Other | Admitting: Physician Assistant

## 2013-06-15 VITALS — BP 122/80 | HR 82 | Ht 63.0 in | Wt 163.0 lb

## 2013-06-15 DIAGNOSIS — I2510 Atherosclerotic heart disease of native coronary artery without angina pectoris: Secondary | ICD-10-CM

## 2013-06-15 DIAGNOSIS — G7241 Inclusion body myositis [IBM]: Secondary | ICD-10-CM

## 2013-06-15 DIAGNOSIS — I10 Essential (primary) hypertension: Secondary | ICD-10-CM

## 2013-06-15 DIAGNOSIS — Z23 Encounter for immunization: Secondary | ICD-10-CM

## 2013-06-15 DIAGNOSIS — R296 Repeated falls: Secondary | ICD-10-CM

## 2013-06-15 DIAGNOSIS — J849 Interstitial pulmonary disease, unspecified: Secondary | ICD-10-CM

## 2013-06-15 DIAGNOSIS — R079 Chest pain, unspecified: Secondary | ICD-10-CM

## 2013-06-15 DIAGNOSIS — I5181 Takotsubo syndrome: Secondary | ICD-10-CM

## 2013-06-15 DIAGNOSIS — Z0000 Encounter for general adult medical examination without abnormal findings: Secondary | ICD-10-CM

## 2013-06-15 DIAGNOSIS — R0602 Shortness of breath: Secondary | ICD-10-CM

## 2013-06-15 DIAGNOSIS — J8410 Pulmonary fibrosis, unspecified: Secondary | ICD-10-CM

## 2013-06-15 DIAGNOSIS — Z9181 History of falling: Secondary | ICD-10-CM

## 2013-06-15 MED ORDER — ISOSORBIDE MONONITRATE ER 30 MG PO TB24: 15.0000 mg | ORAL_TABLET | Freq: Every day | ORAL | Status: DC

## 2013-06-15 NOTE — Patient Instructions (Addendum)
SCHEDULE FOR A LEXISCAN MYOVIEW DX 786.50 ; PER SCOTT WEAVER, PAC NEEDS TO BE THIS WEEK DUE TO RECENT CHEST PAIN SEVERAL TIMES LAST WEEK  PLEASE FOLLOW UP WITH DR. KATZ IN ABOUT 2-3 WEEKS  START IMDUR 15 MG DAILY  YOU GOT THE FLU SHOT TODAY IN THE OFFICE

## 2013-06-15 NOTE — Progress Notes (Signed)
[address], [address] [place], [place]  [address] Phone: [phone] Fax:  [phone]  Date:  06/15/2013   ID:  [name] [place], DOB 05-Jun-1941, MRN [id number]  PCP:  Cala Bradford, MD  Cardiologist:  Dr. Zackery Barefoot     History of Present Illness: [name] is a 72 y.o. female with a h/o remote pulmonary embolism, carotid stenosis, HTN, COPD, GERD, rheumatoid arthritis, interstitial lung disease and irritable bowel syndrome. LHC in 2002 with minimal plaque. Myoview 5/10: no ischemia and an EF of 50%. Echo 4/11: EF of 55-60%; Gr 1 diastolic dysfunction. PFTs in 2012 with no significant abnormality. Repeat LHC 09/30/10 with mild non-obs CAD. Her EF was normal and her left and right heart filling pressures were not significantly elevated. She was admitted 07/2012 with a NSTEMI. Chest CT was negative for pulmonary embolism. Echo 07/27/12: EF 45-50%, mid to distal anteroseptal AK, grade 1 diastolic dysfunction. LHC 07/28/12: mLAD 40-50%, pCFX 40-50%, RCA ok, EF 40% with periapical AK. 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  LV function is known to have recovered.  Echo 09/2012: EF 55%, mild LAE.  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  She is followed by rheumatology.  Last seen by Dr. Zackery Barefoot in 12/2012.  She is followed by Dr. Delton Coombes.  Her dyspnea is felt to be multifactorial and related to ILD, weight gain, immunosuppression medications.    She recently was awoken by chest discomfort. This occurred about 2 weeks ago. She does admit to increased stress. She recently sold her home. Her new home is not ready and she is currently living with her daughter in an apartment. She does note chronic dyspnea with exertion. She is NYHA class IIb-III. She denies any significant change. She also notes a long history of exertional chest tightness. This dates back to her catheterization last year. There has been no significant change. She likely describe CCS class  2-3 symptoms. She denies syncope. She denies about near PND. She has mild ankle edema without significant change.  Recent Labs: 07/27/2012: HDL 37*; LDL (calc) 123*; TSH 2.985  08/12/2012: ALT 56*  09/15/2012: Hemoglobin 13.6  12/19/2012: Creatinine 0.9; Potassium 3.5  08/18/2012: Pro B Natriuretic peptide (BNP) 101.0* 12/05/2012:  BNP 48.9   Wt Readings from Last 3 Encounters:  05/12/13 166 lb (75.297 kg)  04/28/13 166 lb (75.297 kg)  04/02/13 166 lb 9.6 oz (75.569 kg)     Past Medical History  Diagnosis Date  . Pulmonary embolism     a. 07/2012 CTA chest negative for PE.  Marland Kitchen Rheumatoid arthritis(714.0)   . Hypertension   . Incontinence of feces   . Unspecified disorder of bladder   . Rectal prolapse   . Hiatal hernia   . IBS (irritable bowel syndrome)   . GERD (gastroesophageal reflux disease)   . Pain in limb   . Lumbago     hospital admission with back pain in April, 2011 repair of a disc herniation L4-L5   . Osteoarthrosis, unspecified whether generalized or localized, unspecified site   . COPD (chronic obstructive pulmonary disease)   . Tobacco use disorder   . CAD (coronary artery disease) Non-obstructive    a. Nuclear, May, 2010, no ischemia;  b. cath 2012, nonobstructive CAD ( 40% circumflex, 40% LAD) EF normal;  c. 07/2012 NSTEMI/Cath: nonobs dzs, EF 40% w/ periapical AK.  . Carotid artery disease     a. Doppler, July, 2010, 40-59% bilateral, b. Dopplers 6/13:  40-59% bilat (f/u  01/2014)  . Adenomatous polyp 02/2008  . AVM (arteriovenous malformation)     Small colonic  . Shortness of breath     Cardiac evaluation, Dr.Katz, pulmonary evaluation, 2012, Dr. Delton Coombes.  Marland Kitchen UTI (lower urinary tract infection)     Possible UTI by history June 14, 2011,   . Gout   . Shoulder pain, right   . Spinal stenosis   . Hyperlipidemia   . Glaucoma   . Splenomegaly   . Fatty infiltration of liver   . Cystitis   . Takotsubo cardiomyopathy Presumed    a. 07/2012 NSTEMI/Cath  (nonobs dzs)/Echo:EF 45-50%, mid-distal antsept AK w/ Gr 1 DD.;  b.  Echo 09/2012: EF 55%, mild LAE  . Dysphagia     a. s/p prior esophageal dilatation. //  Dysarthria and dysphasia hospitalization December, 2013, neurology workup ongoing  . CHF (congestive heart failure)   . Pneumonia     hx of; has not had since taken pneumonia vaccine  . Elevated CPK     Been evaluated with muscle biopsy, February, 2014  . Speech abnormality     January, 2014, question small CVA  . Depression   . Inclusion body myositis     Current Outpatient Prescriptions  Medication Sig Dispense Refill  . Ascorbic Acid (VITAMIN C) 500 MG tablet Take 1,000 mg by mouth daily.       Marland Kitchen aspirin 81 MG tablet Take 81 mg by mouth daily.        Marland Kitchen atorvastatin (LIPITOR) 80 MG tablet Take 1 tablet (80 mg total) by mouth daily at 6 PM.  30 tablet  6  . Cholecalciferol (VITAMIN D) 1000 UNITS capsule Take 1,000 Units by mouth 2 (two) times daily.        . ciprofloxacin (CIPRO) 250 MG tablet       . cyanocobalamin 2000 MCG tablet Take 2,000 mcg by mouth daily.       . furosemide (LASIX) 40 MG tablet Take 40 mg by mouth daily.      Marland Kitchen leflunomide (ARAVA) 20 MG tablet Take 20 mg by mouth daily. 1/2 tablet daily for 2 weeks then 1 tablet daily      . losartan (COZAAR) 50 MG tablet Take 1 tablet (50 mg total) by mouth daily.  90 tablet  1  . metoprolol succinate (TOPROL-XL) 25 MG 24 hr tablet Take 1 tablet (25 mg total) by mouth daily. Take with or immediately following a meal.  90 tablet  1  . NITROSTAT 0.4 MG SL tablet PLACE 1 TABLET (0.4 MG TOTAL) UNDER THE TONGUE EVERY 5 (FIVE) MINUTES AS NEEDED FOR CHEST PAIN.  25 tablet  1  . pantoprazole (PROTONIX) 40 MG tablet Take 1 tablet (40 mg total) by mouth daily.  90 tablet  1  . potassium chloride (KLOR-CON 10) 10 MEQ tablet TAKE 2 TABLETS (20 MEQ TOTAL) BY MOUTH DAILY.  180 tablet  1  . predniSONE (DELTASONE) 20 MG tablet Take 20 mg by mouth daily.       Marland Kitchen PROAIR HFA 108 (90 BASE)  MCG/ACT inhaler Inhale 2 puffs into the lungs every 6 (six) hours as needed. For shortness breath      . ranitidine (ZANTAC) 150 MG capsule Take 300 mg by mouth every evening.       . trimethoprim (TRIMPEX) 100 MG tablet Take 100 mg by mouth daily.      . [DISCONTINUED] Etanercept (ENBREL [place]) Inject 1 mg into the skin.  Once a week        No current facility-administered medications for this visit.    Allergies:   Methotrexate derivatives; Imuran; Levofloxacin; and Nitrofurantoin   Social History:  The patient  reports that she quit smoking about 5 years ago. She has never used smokeless tobacco. She reports that she does not drink alcohol or use illicit drugs.   Family History:  The patient's family history includes Arthritis in her brother; Brain cancer in her father; Colon cancer in her paternal grandmother; Diabetes in her mother and sister; Heart disease in her maternal uncle, mother, and paternal aunt; Kidney cancer in her maternal uncle; Kidney disease in her mother; Lung cancer in her father; Ovarian cancer in her maternal aunt; Pancreatic cancer in her maternal grandmother; Stomach cancer in her paternal aunt. There is no history of Esophageal cancer or Rectal cancer.   ROS:  Please see the history of present illness.   She has fallen 3 times in the last week. She sees her pain specialist later this week. She has recently developed URI symptoms. She denies significant fever.   All other systems reviewed and negative.   PHYSICAL EXAM: VS:  BP 122/80  Pulse 82  Ht 5\' 3"  (1.6 m)  Wt 163 lb (73.936 kg)  BMI 28.88 kg/m2  SpO2 91% Well nourished, well developed, in no acute distress HEENT: normal Neck: no JVD Cardiac:  normal S1, S2; RRR; no murmur Lungs:  Decreased breath sounds bilaterally, no wheezing, rhonchi or rales Abd: soft, nontender, no hepatomegaly Ext: no edema Skin: warm and dry Neuro:  CNs 2-12 intact, no focal abnormalities noted  EKG:  NSR, HR 82, LAD, PVC, poor  R-wave progression, no significant change since prior tracing     ASSESSMENT AND PLAN:  1. Dyspnea:  Multifactorial and related to interstitial lung disease, weight gain, immunosuppressants. Continue follow up with pulmonology. She does not appear volume overloaded on exam today. 2. Chest Pain: She has typical features to her chest discomfort. She also has stable exertional chest discomfort. Question if she has microvascular ischemia. I have given her isosorbide at one point to try to see if this will limit her symptoms. She never started this. I will place her on isosorbide 15 mg daily. I will also set her up for a Lexiscan Myoview. Plan early follow up. 3. CAD:  Proceed with Myoview as noted. Continue aspirin, statin. Continue beta blocker. 4. Hypertension:  Controlled. 5. Tako-Tsubo CM:  Ejection fraction has recovered. 6. Interstitial Lung Disease:  followup with pulmonology as planned. 7. Inclusion Body Myositis:  Follow up with rheumatology as planned. 8. Falls:  I have asked her to review this with her pain specialist. She may benefit from referral to physical therapy. 9. Disposition:  Follow up with Dr. Myrtis Ser in 2-3 weeks.  Signed, Tereso Newcomer, PA-C  06/15/2013 9:50 AM

## 2013-06-18 ENCOUNTER — Encounter: Payer: Self-pay | Admitting: Cardiology

## 2013-06-23 ENCOUNTER — Ambulatory Visit (HOSPITAL_COMMUNITY): Payer: Medicare Other | Attending: Cardiology | Admitting: Radiology

## 2013-06-23 ENCOUNTER — Encounter: Payer: Self-pay | Admitting: Cardiology

## 2013-06-23 VITALS — BP 134/70 | Ht 63.0 in | Wt 162.0 lb

## 2013-06-23 DIAGNOSIS — Z8249 Family history of ischemic heart disease and other diseases of the circulatory system: Secondary | ICD-10-CM | POA: Insufficient documentation

## 2013-06-23 DIAGNOSIS — I779 Disorder of arteries and arterioles, unspecified: Secondary | ICD-10-CM | POA: Insufficient documentation

## 2013-06-23 DIAGNOSIS — I509 Heart failure, unspecified: Secondary | ICD-10-CM | POA: Insufficient documentation

## 2013-06-23 DIAGNOSIS — I5181 Takotsubo syndrome: Secondary | ICD-10-CM | POA: Insufficient documentation

## 2013-06-23 DIAGNOSIS — Z87891 Personal history of nicotine dependence: Secondary | ICD-10-CM | POA: Insufficient documentation

## 2013-06-23 DIAGNOSIS — R0989 Other specified symptoms and signs involving the circulatory and respiratory systems: Secondary | ICD-10-CM | POA: Insufficient documentation

## 2013-06-23 DIAGNOSIS — I4949 Other premature depolarization: Secondary | ICD-10-CM

## 2013-06-23 DIAGNOSIS — R002 Palpitations: Secondary | ICD-10-CM | POA: Insufficient documentation

## 2013-06-23 DIAGNOSIS — R0609 Other forms of dyspnea: Secondary | ICD-10-CM | POA: Insufficient documentation

## 2013-06-23 DIAGNOSIS — R0602 Shortness of breath: Secondary | ICD-10-CM | POA: Insufficient documentation

## 2013-06-23 DIAGNOSIS — I252 Old myocardial infarction: Secondary | ICD-10-CM | POA: Insufficient documentation

## 2013-06-23 DIAGNOSIS — E785 Hyperlipidemia, unspecified: Secondary | ICD-10-CM | POA: Insufficient documentation

## 2013-06-23 DIAGNOSIS — J449 Chronic obstructive pulmonary disease, unspecified: Secondary | ICD-10-CM | POA: Insufficient documentation

## 2013-06-23 DIAGNOSIS — J4489 Other specified chronic obstructive pulmonary disease: Secondary | ICD-10-CM | POA: Insufficient documentation

## 2013-06-23 DIAGNOSIS — I2510 Atherosclerotic heart disease of native coronary artery without angina pectoris: Secondary | ICD-10-CM

## 2013-06-23 DIAGNOSIS — I10 Essential (primary) hypertension: Secondary | ICD-10-CM | POA: Insufficient documentation

## 2013-06-23 DIAGNOSIS — R079 Chest pain, unspecified: Secondary | ICD-10-CM | POA: Insufficient documentation

## 2013-06-23 MED ORDER — TECHNETIUM TC 99M SESTAMIBI GENERIC - CARDIOLITE
33.0000 | Freq: Once | INTRAVENOUS | Status: AC | PRN
  Administered 2013-06-23: 33 via INTRAVENOUS

## 2013-06-23 MED ORDER — TECHNETIUM TC 99M SESTAMIBI GENERIC - CARDIOLITE
11.0000 | Freq: Once | INTRAVENOUS | Status: AC | PRN
  Administered 2013-06-23: 11 via INTRAVENOUS

## 2013-06-23 MED ORDER — REGADENOSON 0.4 MG/5ML IV SOLN
0.4000 mg | Freq: Once | INTRAVENOUS | Status: AC
  Administered 2013-06-23: 0.4 mg via INTRAVENOUS

## 2013-06-23 NOTE — Progress Notes (Signed)
[facility] SITE 3 NUCLEAR MED [address] [place], [place] [address] [phone]    Cardiology Nuclear Med Study  [name] is a 72 y.o. female     MRN : [id number]     DOB: [birth year]  Procedure Date: 06/23/2013  Nuclear Med Background Indication for Stress Test:  Evaluation for Ischemia History:  COPD and MPI: EF: 50%, MI-07/2012, N/O CAD, 07/2012 Takatsukbo Cardiomyopathy, CHF Cardiac Risk Factors: Carotid Disease, Family History - CAD, History of Smoking, Hypertension and Lipids  Symptoms:  Chest Pain, DOE, Palpitations and SOB   Nuclear Pre-Procedure Caffeine/Decaff Intake:  None > 12 hrs NPO After: 8:00pm   Lungs:  clear O2 Sat: 98% on room air. IV 0.9% NS with Angio Cath:  22g  IV Site: R Antecubital x 1, tolerated well IV Started by:  Irean Hong, RN  Chest Size (in):  38 Cup Size: B  Height: 5\' 3"  (1.6 m)  Weight:  162 lb (73.483 kg)  BMI:  Body mass index is 28.7 kg/(m^2). Tech Comments:  No medications today; held Toprol x 24 hrs per patient.    Nuclear Med Study 1 or 2 day study: 1 day  Stress Test Type:  Lexiscan  Reading MD: Olga Millers, MD  Order Authorizing Provider:  Willa Rough, MD, and Tereso Newcomer, [facility]  Resting Radionuclide: Technetium 52m Sestamibi  Resting Radionuclide Dose: 11.0 mCi   Stress Radionuclide:  Technetium 57m Sestamibi  Stress Radionuclide Dose: 33.0 mCi           Stress Protocol Rest HR: 85 Stress HR: 106  Rest BP: 134/70 Stress BP: 141/65  Exercise Time (min): n/a METS: n/a   Predicted Max HR: 150 bpm % Max HR: 70.67 bpm Rate Pressure Product: 78469   Dose of Adenosine (mg):  n/a Dose of Lexiscan: 0.4 mg  Dose of Atropine (mg): n/a Dose of Dobutamine: n/a mcg/kg/min (at max HR)  Stress Test Technologist: Milana Na, EMT-P  Nuclear Technologist:  Doyne Keel, CNMT     Rest Procedure:  Myocardial perfusion imaging was performed at rest 45 minutes following the intravenous administration of  Technetium 20m Sestamibi. Rest ECG: NSR, PVC, anterior MI, low voltage.  Stress Procedure:  The patient received IV Lexiscan 0.4 mg over 15-seconds.  Technetium 10m Sestamibi injected at 30-seconds. This patient had sob and felt weird with the Lexiscan injection. Quantitative spect images were obtained after a 45 minute delay. Stress ECG: No significant ST segment change suggestive of ischemia.  QPS Raw Data Images:  Acquisition technically good; normal left ventricular size. Stress Images:  Normal homogeneous uptake in all areas of the myocardium. Rest Images:  Normal homogeneous uptake in all areas of the myocardium. Subtraction (SDS):  No evidence of ischemia. Transient Ischemic Dilatation (Normal <1.22):  0.86 Lung/Heart Ratio (Normal <0.45):  0.34  Quantitative Gated Spect Images QGS EDV:  83 ml QGS ESV:  32 ml  Impression Exercise Capacity:  Lexiscan with no exercise. BP Response:  Normal blood pressure response. Clinical Symptoms:  There is dyspnea. ECG Impression:  No significant ST segment change suggestive of ischemia. Comparison with Prior Nuclear Study: No significant change from previous study  Overall Impression:  Normal stress nuclear study.  LV Ejection Fraction: 61%.  LV Wall Motion:  NL LV Function; NL Wall Motion  Olga Millers

## 2013-06-24 ENCOUNTER — Encounter: Payer: Self-pay | Admitting: Physician Assistant

## 2013-06-25 ENCOUNTER — Telehealth: Payer: Self-pay | Admitting: Cardiology

## 2013-06-25 NOTE — Telephone Encounter (Signed)
Pt aware of stress test results.

## 2013-06-25 NOTE — Telephone Encounter (Signed)
Follow Up  Pt calling for test results

## 2013-06-29 ENCOUNTER — Encounter: Payer: Self-pay | Admitting: Cardiology

## 2013-06-30 ENCOUNTER — Ambulatory Visit: Payer: Medicare Other | Admitting: Gastroenterology

## 2013-06-30 ENCOUNTER — Telehealth: Payer: Self-pay | Admitting: Gastroenterology

## 2013-06-30 ENCOUNTER — Ambulatory Visit: Payer: Medicare Other | Admitting: Cardiology

## 2013-06-30 NOTE — Telephone Encounter (Signed)
No charge per Dr. Russella Dar

## 2013-07-03 ENCOUNTER — Ambulatory Visit: Payer: Medicare Other | Admitting: Cardiology

## 2013-07-07 ENCOUNTER — Encounter: Payer: Self-pay | Admitting: Cardiology

## 2013-07-07 ENCOUNTER — Ambulatory Visit (INDEPENDENT_AMBULATORY_CARE_PROVIDER_SITE_OTHER): Payer: Medicare Other | Admitting: Cardiology

## 2013-07-07 VITALS — BP 114/56 | HR 83 | Ht 63.0 in | Wt 161.0 lb

## 2013-07-07 DIAGNOSIS — I10 Essential (primary) hypertension: Secondary | ICD-10-CM

## 2013-07-07 DIAGNOSIS — I779 Disorder of arteries and arterioles, unspecified: Secondary | ICD-10-CM

## 2013-07-07 DIAGNOSIS — I2510 Atherosclerotic heart disease of native coronary artery without angina pectoris: Secondary | ICD-10-CM

## 2013-07-07 NOTE — Assessment & Plan Note (Signed)
Carotids are being followed by Doppler very carefully.

## 2013-07-07 NOTE — Assessment & Plan Note (Signed)
Blood pressures controlled on current meds. No change in therapy.

## 2013-07-07 NOTE — Patient Instructions (Signed)
Your physician recommends that you continue on your current medications as directed. Please refer to the Current Medication list given to you today.  Your physician wants you to follow-up in: 6 months. You will receive a reminder letter in the mail two months in advance. If you don't receive a letter, please call our office to schedule the follow-up appointment.  

## 2013-07-07 NOTE — Progress Notes (Signed)
HPI  Patient is seen to follow up mild coronary disease and an episode of Takotsubo in the past. I saw her last May, 2014. She was then seen by Mr. Alben Spittle with some chest discomfort. She appeared to be stable. She had a nuclear scan on June 23, 2013. There was no scar or ischemia. Ejection fraction was 60%. She returns today and she is feeling better. She's not had any return of chest discomfort. Her breathing is stable.  Allergies  Allergen Reactions  . Methotrexate Derivatives Shortness Of Breath and Nausea Only  . Imuran [Azathioprine] Nausea And Vomiting  . Levofloxacin Swelling  . Nitrofurantoin     *MACROBID*  Tongue swelling    Current Outpatient Prescriptions  Medication Sig Dispense Refill  . Ascorbic Acid (VITAMIN C) 500 MG tablet Take 1,000 mg by mouth daily.       Marland Kitchen aspirin 81 MG tablet Take 81 mg by mouth daily.        Marland Kitchen atorvastatin (LIPITOR) 80 MG tablet Take 1 tablet (80 mg total) by mouth daily at 6 PM.  30 tablet  6  . Cholecalciferol (VITAMIN D) 1000 UNITS capsule Take 1,000 Units by mouth 2 (two) times daily.        . ciprofloxacin (CIPRO) 250 MG tablet       . cyanocobalamin 2000 MCG tablet Take 2,000 mcg by mouth daily.       . furosemide (LASIX) 40 MG tablet Take 40 mg by mouth daily.      . isosorbide mononitrate (IMDUR) 30 MG 24 hr tablet Take 0.5 tablets (15 mg total) by mouth daily.  135 tablet  3  . leflunomide (ARAVA) 20 MG tablet Take 20 mg by mouth daily.       Marland Kitchen losartan (COZAAR) 50 MG tablet Take 1 tablet (50 mg total) by mouth daily.  90 tablet  1  . metoprolol succinate (TOPROL-XL) 25 MG 24 hr tablet Take 1 tablet (25 mg total) by mouth daily. Take with or immediately following a meal.  90 tablet  1  . NITROSTAT 0.4 MG SL tablet PLACE 1 TABLET (0.4 MG TOTAL) UNDER THE TONGUE EVERY 5 (FIVE) MINUTES AS NEEDED FOR CHEST PAIN.  25 tablet  1  . pantoprazole (PROTONIX) 40 MG tablet Take 1 tablet (40 mg total) by mouth daily.  90 tablet  1  . potassium  chloride (KLOR-CON 10) 10 MEQ tablet TAKE 2 TABLETS (20 MEQ TOTAL) BY MOUTH DAILY.  180 tablet  1  . predniSONE (DELTASONE) 20 MG tablet Take 20 mg by mouth daily.       Marland Kitchen PROAIR HFA 108 (90 BASE) MCG/ACT inhaler Inhale 2 puffs into the lungs every 6 (six) hours as needed. For shortness breath      . ranitidine (ZANTAC) 150 MG capsule Take 300 mg by mouth every evening.       . trimethoprim (TRIMPEX) 100 MG tablet Take 100 mg by mouth daily.      . [DISCONTINUED] Etanercept (ENBREL [place]) Inject 1 mg into the skin. Once a week        No current facility-administered medications for this visit.    History   Social History  . Marital Status: Widowed    Spouse Name: N/A    Number of Children: 2  . Years of Education: N/A   Occupational History  . retired    Social History Main Topics  . Smoking status: Former Smoker -- 1.00 packs/day for 30 years  Quit date: 08/14/2007  . Smokeless tobacco: Never Used  . Alcohol Use: No  . Drug Use: No  . Sexual Activity: Not on file   Other Topics Concern  . Not on file   Social History Narrative   Single widow   Alcohol Use - no   Retired from [employer]   Patient is a former smoker. -stopped about 3 years    Illicit Drug Use - no   Patient does not get regular exercise.     Family History  Problem Relation Age of Onset  . Arthritis Brother   . Colon cancer Paternal Grandmother   . Lung cancer Father     x 3 uncles and a aunt  . Brain cancer Father   . Heart disease Mother   . Kidney disease Mother     chronic UTI  . Diabetes Mother   . Diabetes Sister   . Ovarian cancer Maternal Aunt   . Heart disease Maternal Uncle   . Kidney cancer Maternal Uncle   . Heart disease Paternal Aunt   . Stomach cancer Paternal Aunt   . Pancreatic cancer Maternal Grandmother   . Esophageal cancer Neg Hx   . Rectal cancer Neg Hx     Past Medical History  Diagnosis Date  . Pulmonary embolism     a. 07/2012 CTA chest negative for PE.  Marland Kitchen  Rheumatoid arthritis(714.0)   . Hypertension   . Incontinence of feces   . Unspecified disorder of bladder   . Rectal prolapse   . Hiatal hernia   . IBS (irritable bowel syndrome)   . GERD (gastroesophageal reflux disease)   . Pain in limb   . Lumbago     hospital admission with back pain in April, 2011 repair of a disc herniation L4-L5   . Osteoarthrosis, unspecified whether generalized or localized, unspecified site   . COPD (chronic obstructive pulmonary disease)   . Tobacco use disorder   . CAD (coronary artery disease) Non-obstructive    a. Nuclear, May, 2010, no ischemia;  b. cath 2012, nonobstructive CAD ( 40% circumflex, 40% LAD) EF normal;  c. 07/2012 NSTEMI/Cath: nonobs dzs, EF 40% w/ periapical AK.;  d.  Eugenie Birks Myoview (11/14): EF 61%, no ischemia, normal study.  . Carotid artery disease     a. Doppler, July, 2010, 40-59% bilateral, b. Dopplers 6/13:  40-59% bilat (f/u  01/2014)  . Adenomatous polyp 02/2008  . AVM (arteriovenous malformation)     Small colonic  . Shortness of breath     Cardiac evaluation, Dr.Stephanie Littman, pulmonary evaluation, 2012, Dr. Delton Coombes.  Marland Kitchen UTI (lower urinary tract infection)     Possible UTI by history June 14, 2011,   . Gout   . Shoulder pain, right   . Spinal stenosis   . Hyperlipidemia   . Glaucoma   . Splenomegaly   . Fatty infiltration of liver   . Cystitis   . Takotsubo cardiomyopathy Presumed    a. 07/2012 NSTEMI/Cath (nonobs dzs)/Echo:EF 45-50%, mid-distal antsept AK w/ Gr 1 DD.;  b.  Echo 09/2012: EF 55%, mild LAE  . Dysphagia     a. s/p prior esophageal dilatation. //  Dysarthria and dysphasia hospitalization December, 2013, neurology workup ongoing  . CHF (congestive heart failure)   . Pneumonia     hx of; has not had since taken pneumonia vaccine  . Elevated CPK     Been evaluated with muscle biopsy, February, 2014  . Speech abnormality  January, 2014, question small CVA  . Depression   . Inclusion body myositis     Past  Surgical History  Procedure Laterality Date  . Laminectomy      lumbar, HX of L4-5 x 2  . Cervical disc compression      c5-c6 Hx of  . Appendectomy    . Cholecystectomy    . Bladder surgery      x 2  . Vaginal hysterectomy    . Rectocele repair    . Interstim medronic stimulator  04/2010  . Back surgery      x 2  . Eye surgery      bilateral cataract removal  . Cardiac catheterization  07/28/12    no PCI  . Colonoscopy    . Muscle biopsy  09/17/2012    Procedure: GASTROCNEMIUS MUSCLE BIOPSY;  Surgeon: Carmela Hurt, MD;  Location: [facility];  Service: Neurosurgery;  Laterality: Right;  RIGHT DELTOID muscle biopsy    Patient Active Problem List   Diagnosis Date Noted  . Shortness of breath     Priority: High  . Ejection fraction     Priority: High  . Carotid artery disease     Priority: High  . Hypertension     Priority: High  . CAD (coronary artery disease)     Priority: High  . Mouth sores 12/26/2012  . 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 Nausea with vomiting 12/19/2012  . Dyspnea 11/25/2012  . Speech abnormality   . Elevated CPK   . Dysphagia   . Takotsubo cardiomyopathy   . Interstitial lung disease 07/02/2011  . UTI (lower urinary tract infection)   . Pulmonary embolism   . Rheumatoid arthritis   . COPD (chronic obstructive pulmonary disease)   . Tobacco use disorder   . AVM (arteriovenous malformation)   . PREMATURE VENTRICULAR CONTRACTIONS 10/11/2010  . OSTEOARTHRITIS 07/24/2010  . HEMORRHOIDS-INTERNAL 07/24/2010  . ANGIODYSPLASIA-INTESTINE 07/24/2010  . ABDOMINAL PAIN, LEFT UPPER QUADRANT 07/24/2010  . PERSONAL HISTORY OF COLONIC POLYPS 07/24/2010  . Other acquired absence of organ 11/16/2008  . RECTAL PROLAPSE 02/10/2008  . BLADDER PROLAPSE 02/10/2008  . INCONTINENCE OF FECES 02/10/2008  . GASTROESOPHAGEAL REFLUX DISEASE 02/09/2008  . HIATAL HERNIA 02/09/2008  . IRRITABLE BOWEL SYNDROME 02/09/2008    ROS   Patient denies fever, chills,  headache, sweats, rash, change in vision, change in hearing, chest pain, cough, nausea vomiting, urinary symptoms. She is having some mild diarrhea and she'll be following up with her GI doctor. All other systems are reviewed and are negative.  PHYSICAL EXAM  Patient is stable. She is oriented to person time and place. Affect is normal. There is no jugulovenous distention. Lungs reveal decreased breath sounds.Marland Kitchen Respiratory effort is nonlabored. Cardiac exam reveals S1 and S2. There is no peripheral edema. The abdomen is soft.  Filed Vitals:   07/07/13 1114  BP: 114/56  Pulse: 83  Height: 5\' 3"  (1.6 m)  Weight: 161 lb (73.029 kg)     ASSESSMENT & PLAN

## 2013-07-07 NOTE — Assessment & Plan Note (Signed)
Patient has mild coronary disease by history. Her nuclear scan shows no scar or ischemia. Ejection fraction by nuclear is 60%. She's had excellent recovery from her Takotsubo event.

## 2013-07-21 ENCOUNTER — Ambulatory Visit: Payer: Medicare Other | Admitting: Gastroenterology

## 2013-08-25 ENCOUNTER — Telehealth: Payer: Self-pay | Admitting: Gastroenterology

## 2013-08-25 MED ORDER — PANTOPRAZOLE SODIUM 40 MG PO TBEC: 40.0000 mg | DELAYED_RELEASE_TABLET | Freq: Two times a day (BID) | ORAL | Status: DC

## 2013-08-25 NOTE — Telephone Encounter (Signed)
Told patient that I see where she was taking Protonix twice daily back in 2013 and some how we have changed her back to once daily. Patient states her reflux symptoms come back with once daily dosing so she has been taking it twice daily instead. Told patient that I will send her a a twice daily prescription to [facility] on [address] and tell them to fill early. Pt agreed.

## 2013-09-03 ENCOUNTER — Other Ambulatory Visit: Payer: Self-pay | Admitting: Cardiology

## 2013-09-04 ENCOUNTER — Encounter (HOSPITAL_COMMUNITY): Payer: Self-pay | Admitting: Emergency Medicine

## 2013-09-04 ENCOUNTER — Other Ambulatory Visit: Payer: Self-pay | Admitting: Cardiology

## 2013-09-04 ENCOUNTER — Emergency Department (HOSPITAL_COMMUNITY): Payer: Medicare Other

## 2013-09-04 ENCOUNTER — Inpatient Hospital Stay (HOSPITAL_COMMUNITY)
Admission: EM | Admit: 2013-09-04 | Discharge: 2013-09-06 | DRG: 194 | Disposition: A | Discharge status: Home / Self Care | Payer: Medicare Other | Attending: Internal Medicine | Admitting: Internal Medicine

## 2013-09-04 DIAGNOSIS — I10 Essential (primary) hypertension: Secondary | ICD-10-CM | POA: Diagnosis present

## 2013-09-04 DIAGNOSIS — K589 Irritable bowel syndrome without diarrhea: Secondary | ICD-10-CM | POA: Diagnosis present

## 2013-09-04 DIAGNOSIS — Z800 Family history of malignant neoplasm of digestive organs: Secondary | ICD-10-CM

## 2013-09-04 DIAGNOSIS — J441 Chronic obstructive pulmonary disease with (acute) exacerbation: Secondary | ICD-10-CM | POA: Diagnosis present

## 2013-09-04 DIAGNOSIS — I509 Heart failure, unspecified: Secondary | ICD-10-CM | POA: Diagnosis present

## 2013-09-04 DIAGNOSIS — Z808 Family history of malignant neoplasm of other organs or systems: Secondary | ICD-10-CM

## 2013-09-04 DIAGNOSIS — J189 Pneumonia, unspecified organism: Principal | ICD-10-CM | POA: Diagnosis present

## 2013-09-04 DIAGNOSIS — G7241 Inclusion body myositis [IBM]: Secondary | ICD-10-CM

## 2013-09-04 DIAGNOSIS — Z87891 Personal history of nicotine dependence: Secondary | ICD-10-CM

## 2013-09-04 DIAGNOSIS — H409 Unspecified glaucoma: Secondary | ICD-10-CM | POA: Diagnosis present

## 2013-09-04 DIAGNOSIS — Z8249 Family history of ischemic heart disease and other diseases of the circulatory system: Secondary | ICD-10-CM

## 2013-09-04 DIAGNOSIS — Z86711 Personal history of pulmonary embolism: Secondary | ICD-10-CM

## 2013-09-04 DIAGNOSIS — M109 Gout, unspecified: Secondary | ICD-10-CM | POA: Diagnosis present

## 2013-09-04 DIAGNOSIS — Z8041 Family history of malignant neoplasm of ovary: Secondary | ICD-10-CM

## 2013-09-04 DIAGNOSIS — K219 Gastro-esophageal reflux disease without esophagitis: Secondary | ICD-10-CM | POA: Diagnosis present

## 2013-09-04 DIAGNOSIS — IMO0002 Reserved for concepts with insufficient information to code with codable children: Secondary | ICD-10-CM

## 2013-09-04 DIAGNOSIS — Z8673 Personal history of transient ischemic attack (TIA), and cerebral infarction without residual deficits: Secondary | ICD-10-CM

## 2013-09-04 DIAGNOSIS — Z8051 Family history of malignant neoplasm of kidney: Secondary | ICD-10-CM

## 2013-09-04 DIAGNOSIS — M069 Rheumatoid arthritis, unspecified: Secondary | ICD-10-CM | POA: Diagnosis present

## 2013-09-04 DIAGNOSIS — J849 Interstitial pulmonary disease, unspecified: Secondary | ICD-10-CM | POA: Diagnosis present

## 2013-09-04 DIAGNOSIS — I2510 Atherosclerotic heart disease of native coronary artery without angina pectoris: Secondary | ICD-10-CM | POA: Diagnosis present

## 2013-09-04 DIAGNOSIS — K7689 Other specified diseases of liver: Secondary | ICD-10-CM | POA: Diagnosis present

## 2013-09-04 DIAGNOSIS — J8410 Pulmonary fibrosis, unspecified: Secondary | ICD-10-CM | POA: Diagnosis present

## 2013-09-04 DIAGNOSIS — E785 Hyperlipidemia, unspecified: Secondary | ICD-10-CM | POA: Diagnosis present

## 2013-09-04 DIAGNOSIS — Z833 Family history of diabetes mellitus: Secondary | ICD-10-CM

## 2013-09-04 DIAGNOSIS — Z801 Family history of malignant neoplasm of trachea, bronchus and lung: Secondary | ICD-10-CM

## 2013-09-04 DIAGNOSIS — M1990 Unspecified osteoarthritis, unspecified site: Secondary | ICD-10-CM | POA: Diagnosis present

## 2013-09-04 DIAGNOSIS — I252 Old myocardial infarction: Secondary | ICD-10-CM

## 2013-09-04 LAB — POCT I-STAT, CHEM 8
BUN: 9 mg/dL (ref 6–23)
CHLORIDE: 108 meq/L (ref 96–112)
Calcium, Ion: 1.1 mmol/L — ABNORMAL LOW (ref 1.13–1.30)
Creatinine, Ser: 0.9 mg/dL (ref 0.50–1.10)
Glucose, Bld: 112 mg/dL — ABNORMAL HIGH (ref 70–99)
HEMATOCRIT: 33 % — AB (ref 36.0–46.0)
Hemoglobin: 11.2 g/dL — ABNORMAL LOW (ref 12.0–15.0)
Potassium: 3.7 mEq/L (ref 3.7–5.3)
Sodium: 142 mEq/L (ref 137–147)
TCO2: 22 mmol/L (ref 0–100)

## 2013-09-04 LAB — POCT I-STAT TROPONIN I: TROPONIN I, POC: 0.03 ng/mL (ref 0.00–0.08)

## 2013-09-04 NOTE — ED Notes (Signed)
Reports dx with pneumonia yesterday, today became increasingly short of breath.

## 2013-09-04 NOTE — ED Provider Notes (Signed)
CSN: [id number]     Arrival date & time 09/04/13  2314 History   First MD Initiated Contact with Patient 09/04/13 2324     Chief Complaint  Patient presents with  . Shortness of Breath   (Consider location/radiation/quality/duration/timing/severity/associated sxs/prior Treatment) HPI History provided by patient. Fever and difficulty breathing worse over the last few days. Saw her doctor yesterday in the office, had a fever at that time and was started on antibiotics for pneumonia. At home tonight her breathing became more labored. She denies any chest pain. She took Motrin prior to arrival. She is taking antibiotics as prescribed at home. She denies any weight gain or leg swelling. She does have a history of CHF and COPD. Symptoms moderate to severe, worse with exertion. He complains of nausea but denies any vomiting or diarrhea. She has been coughing up green thick sputum without blood. Past Medical History  Diagnosis Date  . Pulmonary embolism     a. 07/2012 CTA chest negative for PE.  Marland Kitchen Rheumatoid arthritis(714.0)   . Hypertension   . Incontinence of feces   . Unspecified disorder of bladder   . Rectal prolapse   . Hiatal hernia   . IBS (irritable bowel syndrome)   . GERD (gastroesophageal reflux disease)   . Pain in limb   . Lumbago     hospital admission with back pain in April, 2011 repair of a disc herniation L4-L5   . Osteoarthrosis, unspecified whether generalized or localized, unspecified site   . COPD (chronic obstructive pulmonary disease)   . Tobacco use disorder   . CAD (coronary artery disease) Non-obstructive    a. Nuclear, May, 2010, no ischemia;  b. cath 2012, nonobstructive CAD ( 40% circumflex, 40% LAD) EF normal;  c. 07/2012 NSTEMI/Cath: nonobs dzs, EF 40% w/ periapical AK.;  d.  Carlton Adam Myoview (11/14): EF 61%, no ischemia, normal study.  . Carotid artery disease     a. Doppler, July, 2010, 40-59% bilateral, b. Dopplers 6/13:  40-59% bilat (f/u  01/2014)  .  Adenomatous polyp 02/2008  . AVM (arteriovenous malformation)     Small colonic  . Shortness of breath     Cardiac evaluation, Dr.Katz, pulmonary evaluation, 2012, Dr. Lamonte Sakai.  Marland Kitchen UTI (lower urinary tract infection)     Possible UTI by history June 14, 2011,   . Gout   . Shoulder pain, right   . Spinal stenosis   . Hyperlipidemia   . Glaucoma   . Splenomegaly   . Fatty infiltration of liver   . Cystitis   . Takotsubo cardiomyopathy Presumed    a. 07/2012 NSTEMI/Cath (nonobs dzs)/Echo:EF 45-50%, mid-distal antsept AK w/ Gr 1 DD.;  b.  Echo 09/2012: EF 55%, mild LAE  . Dysphagia     a. s/p prior esophageal dilatation. //  Dysarthria and dysphasia hospitalization December, 2013, neurology workup ongoing  . CHF (congestive heart failure)   . Pneumonia     hx of; has not had since taken pneumonia vaccine  . Elevated CPK     Been evaluated with muscle biopsy, February, 2014  . Speech abnormality     January, 2014, question small CVA  . Depression   . Inclusion body myositis    Past Surgical History  Procedure Laterality Date  . Laminectomy      lumbar, HX of L4-5 x 2  . Cervical disc compression      c5-c6 Hx of  . Appendectomy    . Cholecystectomy    .  Bladder surgery      x 2  . Vaginal hysterectomy    . Rectocele repair    . Interstim medronic stimulator  04/2010  . Back surgery      x 2  . Eye surgery      bilateral cataract removal  . Cardiac catheterization  07/28/12    no PCI  . Colonoscopy    . Muscle biopsy  09/17/2012    Procedure: GASTROCNEMIUS MUSCLE BIOPSY;  Surgeon: Winfield Cunas, MD;  Location: [place] [facility];  Service: Neurosurgery;  Laterality: Right;  RIGHT DELTOID muscle biopsy   Family History  Problem Relation Age of Onset  . Arthritis Brother   . Colon cancer Paternal Grandmother   . Lung cancer Father     x 3 uncles and a aunt  . Brain cancer Father   . Heart disease Mother   . Kidney disease Mother     chronic UTI  . Diabetes Mother   .  Diabetes Sister   . Ovarian cancer Maternal Aunt   . Heart disease Maternal Uncle   . Kidney cancer Maternal Uncle   . Heart disease Paternal Aunt   . Stomach cancer Paternal Aunt   . Pancreatic cancer Maternal Grandmother   . Esophageal cancer Neg Hx   . Rectal cancer Neg Hx    History  Substance Use Topics  . Smoking status: Former Smoker -- 1.00 packs/day for 30 years    Quit date: 08/14/2007  . Smokeless tobacco: Never Used  . Alcohol Use: No   OB History   Grav Para Term Preterm Abortions TAB SAB Ect Mult Living                 Review of Systems  Constitutional: Positive for fever and chills.  Respiratory: Positive for cough and shortness of breath.   Cardiovascular: Negative for chest pain.  Gastrointestinal: Negative for abdominal pain.  Genitourinary: Negative for dysuria.  Musculoskeletal: Negative for back pain, neck pain and neck stiffness.  Skin: Negative for rash.  Neurological: Negative for headaches.  All other systems reviewed and are negative.    Allergies  Methotrexate derivatives; Imuran; Levofloxacin; and Nitrofurantoin  Home Medications   Current Outpatient Rx  Name  Route  Sig  Dispense  Refill  . Ascorbic Acid (VITAMIN C) 500 MG tablet   Oral   Take 1,000 mg by mouth daily.          Marland Kitchen aspirin 81 MG tablet   Oral   Take 81 mg by mouth daily.           Marland Kitchen atorvastatin (LIPITOR) 80 MG tablet   Oral   Take 1 tablet (80 mg total) by mouth daily at 6 PM.   30 tablet   6   . Cholecalciferol (VITAMIN D) 1000 UNITS capsule   Oral   Take 1,000 Units by mouth 2 (two) times daily.           . ciprofloxacin (CIPRO) 250 MG tablet               . cyanocobalamin 2000 MCG tablet   Oral   Take 2,000 mcg by mouth daily.          . furosemide (LASIX) 40 MG tablet   Oral   Take 40 mg by mouth daily.         . isosorbide mononitrate (IMDUR) 30 MG 24 hr tablet   Oral   Take 0.5 tablets (15 mg total)  by mouth daily.   135 tablet    3   . leflunomide (ARAVA) 20 MG tablet   Oral   Take 20 mg by mouth daily.          Marland Kitchen losartan (COZAAR) 50 MG tablet      TAKE 1 TABLET (50 MG TOTAL) BY MOUTH DAILY.   90 tablet   0   . metoprolol succinate (TOPROL-XL) 25 MG 24 hr tablet      TAKE 1 TABLET (25 MG TOTAL) BY MOUTH DAILY. TAKE WITH OR IMMEDIATELY FOLLOWING A MEAL.   90 tablet   1   . NITROSTAT 0.4 MG SL tablet      PLACE 1 TABLET (0.4 MG TOTAL) UNDER THE TONGUE EVERY 5 (FIVE) MINUTES AS NEEDED FOR CHEST PAIN.   25 tablet   1   . pantoprazole (PROTONIX) 40 MG tablet   Oral   Take 1 tablet (40 mg total) by mouth 2 (two) times daily.   180 tablet   0     Please fill early, patient ran out   . potassium chloride (KLOR-CON 10) 10 MEQ tablet      TAKE 2 TABLETS (20 MEQ TOTAL) BY MOUTH DAILY.   180 tablet   1   . predniSONE (DELTASONE) 20 MG tablet   Oral   Take 20 mg by mouth daily.          Marland Kitchen PROAIR HFA 108 (90 BASE) MCG/ACT inhaler   Inhalation   Inhale 2 puffs into the lungs every 6 (six) hours as needed. For shortness breath         . ranitidine (ZANTAC) 150 MG capsule   Oral   Take 300 mg by mouth every evening.          . trimethoprim (TRIMPEX) 100 MG tablet   Oral   Take 100 mg by mouth daily.          BP 134/68  Temp(Src) 98.6 F (37 C)  Resp 26  Ht 5\' 3"  (1.6 m)  Wt 167 lb (75.751 kg)  BMI 29.59 kg/m2  SpO2 95% Physical Exam  Constitutional: She is oriented to person, place, and time. She appears well-developed and well-nourished.  HENT:  Head: Normocephalic and atraumatic.  Eyes: EOM are normal. Pupils are equal, round, and reactive to light.  Neck: Neck supple.  Cardiovascular: Regular rhythm and intact distal pulses.   Tachycardic  Pulmonary/Chest:  Respiratory rate 20s, some increased work of breathing, coarse bilateral breath sounds  Abdominal: Soft. Bowel sounds are normal. She exhibits no distension. There is no tenderness.  Musculoskeletal: Normal range of  motion. She exhibits no edema and no tenderness.  Neurological: She is alert and oriented to person, place, and time.  Skin: Skin is warm and dry.    ED Course  Procedures (including critical care time) Labs Review Labs Reviewed  CBC - Abnormal; Notable for the following:    Hemoglobin 11.4 (*)    HCT 34.3 (*)    All other components within normal limits  POCT I-STAT, CHEM 8 - Abnormal; Notable for the following:    Glucose, Bld 112 (*)    Calcium, Ion 1.10 (*)    Hemoglobin 11.2 (*)    HCT 33.0 (*)    All other components within normal limits  PRO B NATRIURETIC PEPTIDE  URINALYSIS, ROUTINE W REFLEX MICROSCOPIC  POCT I-STAT TROPONIN I   Imaging Review Dg Chest Portable 1 View  09/04/2013   CLINICAL DATA:  Shortness of breath  EXAM: PORTABLE CHEST - 1 VIEW  COMPARISON:  08/18/2012  FINDINGS: The heart size is upper limits of normal in size. The mediastinal contours are within normal limits. Both lungs are clear. The visualized skeletal structures are unremarkable.  IMPRESSION: 1. No acute findings. 2. Mild cardiac enlargement   Electronically Signed   By: Kerby Moors M.D.   On: 09/04/2013 23:54     Date: 09/04/2013  Rate: 117  Rhythm: sinus tachycardia  QRS Axis: left  Intervals: normal  ST/T Wave abnormalities: nonspecific ST changes  Conduction Disutrbances:none  Narrative Interpretation: ST with baseline wander, PVCs  Old EKG Reviewed: previous ECG 06-15-13 sinus rate 82 o/w no sig changes   11:30 PM initiated BiPAP for inc WOB, after a few minutes, she is unable tolerate it. She maintains her O2 sats with 2 L of oxygen.   2:30 AM discussed with Dr. Alcario Drought, is requesting CT scan and will evaluate bedside for admission MDM  Diagnosis: Community acquired pneumonia  Chest x-ray, EKG, labs reviewed as above Oxygen provided. Antibiotics provided. MED Admission   Teressa Lower, MD 09/05/13 [phone]

## 2013-09-04 NOTE — ED Notes (Signed)
Pt is having trouble tolerating the Bi-PAP. Pt repeatedly reports nausea, and dry heaves. Pt does report nausea and vomiting this AM.

## 2013-09-05 ENCOUNTER — Emergency Department (HOSPITAL_COMMUNITY): Payer: Medicare Other

## 2013-09-05 ENCOUNTER — Encounter (HOSPITAL_COMMUNITY): Payer: Self-pay | Admitting: *Deleted

## 2013-09-05 DIAGNOSIS — J441 Chronic obstructive pulmonary disease with (acute) exacerbation: Secondary | ICD-10-CM

## 2013-09-05 DIAGNOSIS — I10 Essential (primary) hypertension: Secondary | ICD-10-CM

## 2013-09-05 DIAGNOSIS — J8410 Pulmonary fibrosis, unspecified: Secondary | ICD-10-CM

## 2013-09-05 DIAGNOSIS — G7241 Inclusion body myositis [IBM]: Secondary | ICD-10-CM

## 2013-09-05 DIAGNOSIS — J189 Pneumonia, unspecified organism: Secondary | ICD-10-CM | POA: Diagnosis present

## 2013-09-05 LAB — URINALYSIS, ROUTINE W REFLEX MICROSCOPIC
Bilirubin Urine: NEGATIVE
Glucose, UA: NEGATIVE mg/dL
Hgb urine dipstick: NEGATIVE
Ketones, ur: NEGATIVE mg/dL
LEUKOCYTES UA: NEGATIVE
NITRITE: NEGATIVE
PROTEIN: NEGATIVE mg/dL
Specific Gravity, Urine: 1.011 (ref 1.005–1.030)
Urobilinogen, UA: 0.2 mg/dL (ref 0.0–1.0)
pH: 6.5 (ref 5.0–8.0)

## 2013-09-05 LAB — CBC
HCT: 34.3 % — ABNORMAL LOW (ref 36.0–46.0)
HEMOGLOBIN: 11.4 g/dL — AB (ref 12.0–15.0)
MCH: 28.7 pg (ref 26.0–34.0)
MCHC: 33.2 g/dL (ref 30.0–36.0)
MCV: 86.4 fL (ref 78.0–100.0)
PLATELETS: 168 10*3/uL (ref 150–400)
RBC: 3.97 MIL/uL (ref 3.87–5.11)
RDW: 14.7 % (ref 11.5–15.5)
WBC: 9 10*3/uL (ref 4.0–10.5)

## 2013-09-05 LAB — INFLUENZA PANEL BY PCR (TYPE A & B)
H1N1 flu by pcr: NOT DETECTED
Influenza A By PCR: NEGATIVE
Influenza B By PCR: NEGATIVE

## 2013-09-05 LAB — PRO B NATRIURETIC PEPTIDE: PRO B NATRI PEPTIDE: 1403 pg/mL — AB (ref 0–125)

## 2013-09-05 MED ORDER — ATORVASTATIN CALCIUM 80 MG PO TABS
80.0000 mg | ORAL_TABLET | Freq: Every day | ORAL | Status: DC
  Administered 2013-09-05: 80 mg via ORAL
  Filled 2013-09-05 (×2): qty 1

## 2013-09-05 MED ORDER — DEXTROSE 5 % IV SOLN: 1.0000 g | INTRAVENOUS | Status: DC

## 2013-09-05 MED ORDER — BENZONATATE 100 MG PO CAPS
200.0000 mg | ORAL_CAPSULE | Freq: Three times a day (TID) | ORAL | Status: DC | PRN
  Filled 2013-09-05: qty 2

## 2013-09-05 MED ORDER — ISOSORBIDE MONONITRATE 15 MG HALF TABLET
15.0000 mg | ORAL_TABLET | Freq: Every day | ORAL | Status: DC
  Administered 2013-09-05 – 2013-09-06 (×2): 15 mg via ORAL
  Filled 2013-09-05 (×2): qty 1

## 2013-09-05 MED ORDER — AZITHROMYCIN 500 MG IV SOLR: 500.0000 mg | INTRAVENOUS | Status: DC

## 2013-09-05 MED ORDER — ALBUTEROL SULFATE HFA 108 (90 BASE) MCG/ACT IN AERS: 2.0000 | INHALATION_SPRAY | Freq: Four times a day (QID) | RESPIRATORY_TRACT | Status: DC | PRN

## 2013-09-05 MED ORDER — ASPIRIN EC 81 MG PO TBEC
81.0000 mg | DELAYED_RELEASE_TABLET | Freq: Every day | ORAL | Status: DC
  Administered 2013-09-05 – 2013-09-06 (×2): 81 mg via ORAL
  Filled 2013-09-05 (×2): qty 1

## 2013-09-05 MED ORDER — VITAMIN C 500 MG PO TABS
1000.0000 mg | ORAL_TABLET | Freq: Every day | ORAL | Status: DC
  Administered 2013-09-05 – 2013-09-06 (×2): 1000 mg via ORAL
  Filled 2013-09-05 (×2): qty 2

## 2013-09-05 MED ORDER — LEFLUNOMIDE 20 MG PO TABS
10.0000 mg | ORAL_TABLET | Freq: Every day | ORAL | Status: DC
  Administered 2013-09-05 – 2013-09-06 (×2): 10 mg via ORAL
  Filled 2013-09-05 (×2): qty 1

## 2013-09-05 MED ORDER — HEPARIN SODIUM (PORCINE) 5000 UNIT/ML IJ SOLN
5000.0000 [IU] | Freq: Three times a day (TID) | INTRAMUSCULAR | Status: DC
  Administered 2013-09-05 – 2013-09-06 (×3): 5000 [IU] via SUBCUTANEOUS
  Filled 2013-09-05 (×6): qty 1

## 2013-09-05 MED ORDER — IOHEXOL 350 MG/ML SOLN
100.0000 mL | Freq: Once | INTRAVENOUS | Status: AC | PRN
  Administered 2013-09-05: 100 mL via INTRAVENOUS

## 2013-09-05 MED ORDER — FUROSEMIDE 10 MG/ML IJ SOLN
40.0000 mg | Freq: Once | INTRAMUSCULAR | Status: AC
  Administered 2013-09-05: 40 mg via INTRAVENOUS
  Filled 2013-09-05: qty 4

## 2013-09-05 MED ORDER — PREDNISONE 20 MG PO TABS
30.0000 mg | ORAL_TABLET | Freq: Every day | ORAL | Status: DC
  Administered 2013-09-05 – 2013-09-06 (×2): 30 mg via ORAL
  Filled 2013-09-05 (×4): qty 1

## 2013-09-05 MED ORDER — PREDNISONE 5 MG PO TABS
5.0000 mg | ORAL_TABLET | Freq: Every day | ORAL | Status: DC
  Administered 2013-09-05: 5 mg via ORAL
  Filled 2013-09-05 (×2): qty 1

## 2013-09-05 MED ORDER — METOPROLOL SUCCINATE ER 25 MG PO TB24
25.0000 mg | ORAL_TABLET | Freq: Every day | ORAL | Status: DC
  Administered 2013-09-05 – 2013-09-06 (×2): 25 mg via ORAL
  Filled 2013-09-05 (×2): qty 1

## 2013-09-05 MED ORDER — ONDANSETRON HCL 4 MG/2ML IJ SOLN
4.0000 mg | Freq: Once | INTRAMUSCULAR | Status: AC
  Administered 2013-09-05: 4 mg via INTRAVENOUS
  Filled 2013-09-05: qty 2

## 2013-09-05 MED ORDER — FUROSEMIDE 40 MG PO TABS
40.0000 mg | ORAL_TABLET | Freq: Every day | ORAL | Status: DC
  Administered 2013-09-05 – 2013-09-06 (×2): 40 mg via ORAL
  Filled 2013-09-05 (×2): qty 1

## 2013-09-05 MED ORDER — ALBUTEROL SULFATE (2.5 MG/3ML) 0.083% IN NEBU: 2.5000 mg | INHALATION_SOLUTION | Freq: Four times a day (QID) | RESPIRATORY_TRACT | Status: DC | PRN

## 2013-09-05 MED ORDER — ZOLPIDEM TARTRATE 5 MG PO TABS
5.0000 mg | ORAL_TABLET | Freq: Every evening | ORAL | Status: DC | PRN
  Administered 2013-09-05: 5 mg via ORAL
  Filled 2013-09-05: qty 1

## 2013-09-05 MED ORDER — IPRATROPIUM-ALBUTEROL 0.5-2.5 (3) MG/3ML IN SOLN
3.0000 mL | Freq: Four times a day (QID) | RESPIRATORY_TRACT | Status: DC
  Administered 2013-09-05 – 2013-09-06 (×4): 3 mL via RESPIRATORY_TRACT
  Filled 2013-09-05 (×5): qty 3

## 2013-09-05 MED ORDER — CEFUROXIME AXETIL 500 MG PO TABS: 500.0000 mg | ORAL_TABLET | Freq: Two times a day (BID) | ORAL | Status: DC

## 2013-09-05 MED ORDER — PIPERACILLIN-TAZOBACTAM 3.375 G IVPB
3.3750 g | Freq: Once | INTRAVENOUS | Status: AC
  Administered 2013-09-05: 3.375 g via INTRAVENOUS
  Filled 2013-09-05: qty 50

## 2013-09-05 MED ORDER — LOSARTAN POTASSIUM 50 MG PO TABS
50.0000 mg | ORAL_TABLET | Freq: Every day | ORAL | Status: DC
Start: 2013-09-05 — End: 2013-09-06
  Administered 2013-09-05 – 2013-09-06 (×2): 50 mg via ORAL
  Filled 2013-09-05 (×2): qty 1

## 2013-09-05 MED ORDER — DEXTROSE 5 % IV SOLN
1.0000 g | INTRAVENOUS | Status: DC
  Administered 2013-09-05 – 2013-09-06 (×2): 1 g via INTRAVENOUS
  Filled 2013-09-05 (×2): qty 10

## 2013-09-05 MED ORDER — VANCOMYCIN HCL IN DEXTROSE 1-5 GM/200ML-% IV SOLN
1000.0000 mg | Freq: Once | INTRAVENOUS | Status: AC
  Administered 2013-09-05: 1000 mg via INTRAVENOUS
  Filled 2013-09-05: qty 200

## 2013-09-05 MED ORDER — PANTOPRAZOLE SODIUM 40 MG PO TBEC
80.0000 mg | DELAYED_RELEASE_TABLET | Freq: Every day | ORAL | Status: DC
  Administered 2013-09-05 – 2013-09-06 (×2): 80 mg via ORAL
  Filled 2013-09-05 (×2): qty 2

## 2013-09-05 MED ORDER — POTASSIUM CHLORIDE ER 10 MEQ PO TBCR
10.0000 meq | EXTENDED_RELEASE_TABLET | Freq: Every day | ORAL | Status: DC
  Administered 2013-09-05 – 2013-09-06 (×2): 10 meq via ORAL
  Filled 2013-09-05 (×2): qty 1

## 2013-09-05 MED ORDER — AZITHROMYCIN 500 MG IV SOLR
500.0000 mg | INTRAVENOUS | Status: DC
  Administered 2013-09-05 – 2013-09-06 (×2): 500 mg via INTRAVENOUS
  Filled 2013-09-05 (×2): qty 500

## 2013-09-05 NOTE — H&P (Signed)
[facility] History and Physical  Neddie Hergert Spang [id number] DOB: 1941/03/27 DOA: 09/04/2013  Referring physician: EDP PCP: Vidal Schwalbe, MD   Chief Complaint: SOB   HPI: [name] is a 73 y.o. female who presents to the ED with SOB.  Has had wet sounding cough for the past few days but worse significantly today.  Does report subjective fever at home.  Saw doctor in office yesterday, had fever at that time, started on ABx for PNA diagnosis.  Symptoms have since worsened despite taking ABX at home as prescribed, so she presents to the ED.  Also has h/o CHF and COPD.  Cough productive of greenish sputum.  In ED given zosyn and vanc initially as well as lasix, CT angio ordered.  Review of Systems: Systems reviewed.  As above, otherwise negative  Past Medical History  Diagnosis Date  . Pulmonary embolism     a. 07/2012 CTA chest negative for PE.  Marland Kitchen Rheumatoid arthritis(714.0)   . Hypertension   . Incontinence of feces   . Unspecified disorder of bladder   . Rectal prolapse   . Hiatal hernia   . IBS (irritable bowel syndrome)   . GERD (gastroesophageal reflux disease)   . Pain in limb   . Lumbago     hospital admission with back pain in April, 2011 repair of a disc herniation L4-L5   . Osteoarthrosis, unspecified whether generalized or localized, unspecified site   . COPD (chronic obstructive pulmonary disease)   . Tobacco use disorder   . CAD (coronary artery disease) Non-obstructive    a. Nuclear, May, 2010, no ischemia;  b. cath 2012, nonobstructive CAD ( 40% circumflex, 40% LAD) EF normal;  c. 07/2012 NSTEMI/Cath: nonobs dzs, EF 40% w/ periapical AK.;  d.  Carlton Adam Myoview (11/14): EF 61%, no ischemia, normal study.  . Carotid artery disease     a. Doppler, July, 2010, 40-59% bilateral, b. Dopplers 6/13:  40-59% bilat (f/u  01/2014)  . Adenomatous polyp 02/2008  . AVM (arteriovenous malformation)     Small colonic  . Shortness of breath     Cardiac  evaluation, Dr.Katz, pulmonary evaluation, 2012, Dr. Lamonte Sakai.  Marland Kitchen UTI (lower urinary tract infection)     Possible UTI by history June 14, 2011,   . Gout   . Shoulder pain, right   . Spinal stenosis   . Hyperlipidemia   . Glaucoma   . Splenomegaly   . Fatty infiltration of liver   . Cystitis   . Takotsubo cardiomyopathy Presumed    a. 07/2012 NSTEMI/Cath (nonobs dzs)/Echo:EF 45-50%, mid-distal antsept AK w/ Gr 1 DD.;  b.  Echo 09/2012: EF 55%, mild LAE  . Dysphagia     a. s/p prior esophageal dilatation. //  Dysarthria and dysphasia hospitalization December, 2013, neurology workup ongoing  . CHF (congestive heart failure)   . Pneumonia     hx of; has not had since taken pneumonia vaccine  . Elevated CPK     Been evaluated with muscle biopsy, February, 2014  . Speech abnormality     January, 2014, question small CVA  . Depression   . Inclusion body myositis    Past Surgical History  Procedure Laterality Date  . Laminectomy      lumbar, HX of L4-5 x 2  . Cervical disc compression      c5-c6 Hx of  . Appendectomy    . Cholecystectomy    . Bladder surgery      x 2  .  Vaginal hysterectomy    . Rectocele repair    . Interstim medronic stimulator  04/2010  . Back surgery      x 2  . Eye surgery      bilateral cataract removal  . Cardiac catheterization  07/28/12    no PCI  . Colonoscopy    . Muscle biopsy  09/17/2012    Procedure: GASTROCNEMIUS MUSCLE BIOPSY;  Surgeon: Winfield Cunas, MD;  Location: [place] [facility];  Service: Neurosurgery;  Laterality: Right;  RIGHT DELTOID muscle biopsy   Social History:  reports that she quit smoking about 6 years ago. She has never used smokeless tobacco. She reports that she does not drink alcohol or use illicit drugs.  Allergies  Allergen Reactions  . Methotrexate Derivatives Shortness Of Breath and Other (See Comments)    Caused build up of scarring in  Lungs.  Causes problems with elevated liver enzymes.  . Codeine Itching    Causes  minor itching, patient can take with benadryl  . Imuran [Azathioprine] Nausea And Vomiting  . Levofloxacin Swelling  . Lunesta [Eszopiclone] Nausea And Vomiting  . Nitrofurantoin Itching    *MACROBID*  Tongue swelling    Family History  Problem Relation Age of Onset  . Arthritis Brother   . Colon cancer Paternal Grandmother   . Lung cancer Father     x 3 uncles and a aunt  . Brain cancer Father   . Heart disease Mother   . Kidney disease Mother     chronic UTI  . Diabetes Mother   . Diabetes Sister   . Ovarian cancer Maternal Aunt   . Heart disease Maternal Uncle   . Kidney cancer Maternal Uncle   . Heart disease Paternal Aunt   . Stomach cancer Paternal Aunt   . Pancreatic cancer Maternal Grandmother   . Esophageal cancer Neg Hx   . Rectal cancer Neg Hx      Prior to Admission medications   Medication Sig Start Date End Date Taking? Authorizing Provider  Ascorbic Acid (VITAMIN C) 500 MG tablet Take 1,000 mg by mouth daily.    Yes Historical Provider, MD  aspirin 81 MG tablet Take 81 mg by mouth daily.     Yes Historical Provider, MD  atorvastatin (LIPITOR) 80 MG tablet Take 1 tablet (80 mg total) by mouth daily at 6 PM. 07/30/12  Yes Rogelia Mire, NP  benzonatate (TESSALON) 200 MG capsule Take 200 mg by mouth 3 (three) times daily as needed for cough.   Yes Historical Provider, MD  cefUROXime (CEFTIN) 500 MG tablet Take 500 mg by mouth 2 (two) times daily with a meal. For upper respiratory infection, 10 day treatment beginning 09/04/13   Yes Historical Provider, MD  Cholecalciferol 2000 UNITS TABS Take 4,000 Units by mouth 2 (two) times daily.   Yes Historical Provider, MD  cyanocobalamin 2000 MCG tablet Take 2,000 mcg by mouth daily.    Yes Historical Provider, MD  Dextromethorphan-Guaifenesin (MUCUS RELIEF DM) 20-400 MG TABS Take 1 tablet by mouth every 4 (four) hours as needed (couh and congestion).   Yes Historical Provider, MD  furosemide (LASIX) 40 MG tablet Take  40 mg by mouth daily.   Yes Historical Provider, MD  isosorbide mononitrate (IMDUR) 30 MG 24 hr tablet Take 0.5 tablets (15 mg total) by mouth daily. 06/15/13  Yes Scott Joylene Draft, PA-C  leflunomide (ARAVA) 20 MG tablet Take 10 mg by mouth daily.    Yes Historical Provider, MD  losartan (COZAAR) 50 MG tablet TAKE 1 TABLET (50 MG TOTAL) BY MOUTH DAILY. 09/03/13  Yes Carlena Bjornstad, MD  metoprolol succinate (TOPROL-XL) 25 MG 24 hr tablet TAKE 1 TABLET (25 MG TOTAL) BY MOUTH DAILY. TAKE WITH OR IMMEDIATELY FOLLOWING A MEAL. 09/04/13  Yes Carlena Bjornstad, MD  pantoprazole (PROTONIX) 40 MG tablet Take 80 mg by mouth daily.   Yes Historical Provider, MD  potassium chloride (KLOR-CON 10) 10 MEQ tablet TAKE 2 TABLETS (20 MEQ TOTAL) BY MOUTH DAILY. 03/12/13  Yes Carlena Bjornstad, MD  predniSONE (DELTASONE) 5 MG tablet Take 5 mg by mouth daily with breakfast.   Yes Historical Provider, MD  PROAIR HFA 108 (90 BASE) MCG/ACT inhaler Inhale 2 puffs into the lungs every 6 (six) hours as needed. For shortness breath 08/09/12  Yes Historical Provider, MD  ranitidine (ZANTAC) 300 MG tablet Take 300 mg by mouth at bedtime.   Yes Historical Provider, MD  trimethoprim (TRIMPEX) 100 MG tablet Take 100 mg by mouth daily.   Yes Historical Provider, MD  ciprofloxacin (CIPRO) 250 MG tablet  05/11/13   Historical Provider, MD  NITROSTAT 0.4 MG SL tablet PLACE 1 TABLET (0.4 MG TOTAL) UNDER THE TONGUE EVERY 5 (FIVE) MINUTES AS NEEDED FOR CHEST PAIN. 04/25/13   Carlena Bjornstad, MD   Physical Exam: Filed Vitals:   09/05/13 0330  BP: 132/54  Pulse: 94  Temp:   Resp: 22    BP 132/54  Pulse 94  Temp(Src) 98.1 F (36.7 C) (Oral)  Resp 22  Ht 5\' 3"  (1.6 m)  Wt 75.751 kg (167 lb)  BMI 29.59 kg/m2  SpO2 96%  General Appearance:    Alert, oriented, no distress, appears stated age  Head:    Normocephalic, atraumatic  Eyes:    PERRL, EOMI, sclera non-icteric        Nose:   Nares without drainage or epistaxis. Mucosa, turbinates  normal  Throat:   Moist mucous membranes. Oropharynx without erythema or exudate.  Neck:   Supple. No carotid bruits.  No thyromegaly.  No lymphadenopathy.   Back:     No CVA tenderness, no spinal tenderness  Lungs:     Coarse breath sounds bilaterally.  Chest wall:    No tenderness to palpitation  Heart:    Regular rate and rhythm without murmurs, gallops, rubs  Abdomen:     Soft, non-tender, nondistended, normal bowel sounds, no organomegaly  Genitalia:    deferred  Rectal:    deferred  Extremities:   No clubbing, cyanosis or edema.  Pulses:   2+ and symmetric all extremities  Skin:   Skin color, texture, turgor normal, no rashes or lesions  Lymph nodes:   Cervical, supraclavicular, and axillary nodes normal  Neurologic:   CNII-XII intact. Normal strength, sensation and reflexes      throughout    Labs on Admission:  Basic Metabolic Panel:  Recent Labs Lab 09/04/13 2352  NA 142  K 3.7  CL 108  GLUCOSE 112*  BUN 9  CREATININE 0.90   Liver Function Tests: No results found for this basename: AST, ALT, ALKPHOS, BILITOT, PROT, ALBUMIN,  in the last 168 hours No results found for this basename: LIPASE, AMYLASE,  in the last 168 hours No results found for this basename: AMMONIA,  in the last 168 hours CBC:  Recent Labs Lab 09/04/13 0115 09/04/13 2352  WBC 9.0  --   HGB 11.4* 11.2*  HCT 34.3* 33.0*  MCV 86.4  --  PLT 168  --    Cardiac Enzymes: No results found for this basename: CKTOTAL, CKMB, CKMBINDEX, TROPONINI,  in the last 168 hours  BNP (last 3 results)  Recent Labs  09/04/13 0115  PROBNP 1403.0*   CBG: No results found for this basename: GLUCAP,  in the last 168 hours  Radiological Exams on Admission: Dg Chest Portable 1 View  09/04/2013   CLINICAL DATA:  Shortness of breath  EXAM: PORTABLE CHEST - 1 VIEW  COMPARISON:  08/18/2012  FINDINGS: The heart size is upper limits of normal in size. The mediastinal contours are within normal limits. Both lungs  are clear. The visualized skeletal structures are unremarkable.  IMPRESSION: 1. No acute findings. 2. Mild cardiac enlargement   Electronically Signed   By: Kerby Moors M.D.   On: 09/04/2013 23:54    EKG: Independently reviewed.  Assessment/Plan Active Problems:   Interstitial lung disease   CAP (community acquired pneumonia)   1. CAP - patient with possible PNA seen on CT chest, given the subjective fever, acute worsening of SOB, will treat as CAP, on cap pathway. 2. ILD - patients ILD appears to be worsening, likely needs pulmonology follow up.    Code Status: Full  Family Communication: Family at bedside Disposition Plan: Admit to inpatient   Time spent: 50 min  GARDNER, JARED M. [facility] Pager [phone]  If 7AM-7PM, please contact the day team taking care of the patient [email or web address] Password [facility] 09/05/2013, 4:34 AM

## 2013-09-05 NOTE — ED Notes (Signed)
CT notified that pt is ready for her exam.

## 2013-09-05 NOTE — ED Notes (Signed)
Gardener, MD at bedside.  

## 2013-09-05 NOTE — Progress Notes (Signed)
PATIENT DETAILS Name: [name] Age: 73 y.o. Sex: female Date of Birth: 1941-03-17 Admit Date: 09/04/2013 Admitting Physician Etta Quill, DO RDE:[id number],[id number] S, MD  Subjective: Feels much better  Assessment/Plan: Active Problems: PNA -afebrile overnight, no leukocytosis -clinically improved  -blood culture-pending -c/w Rocephin/Zithromax-day 2  COPD exacerbation -wheezing this am -start scheuled nebs -on chronic prednisone 5 mg, will increase to 30 mg-taper off quickly back to usual home regimen  HTN -controlled-c/w Metropolol, Losartan,Lasix  Non-obstructive CAD -stable -ASA, Metoprolol, Statins  Hx of Rheumatoid Arthritis/Inclusion body myositis -on prednisone, Leflunomide  Interstitial lung disease -CAP (community acquired pneumonia)  Disposition: Remain inpatient  DVT Prophylaxis: Prophylactic  Heparin   Code Status: Full code   Family Communication None at bedisde  Procedures:  None  CONSULTS:  None  Time spent 40 minutes-which includes 50% of the time with face-to-face with patient/ family and coordinating care related to the above assessment and plan.  MEDICATIONS: Scheduled Meds: . aspirin EC  81 mg Oral Daily  . atorvastatin  80 mg Oral q1800  . azithromycin  500 mg Intravenous Q24H  . cefTRIAXone (ROCEPHIN)  IV  1 g Intravenous Q24H  . furosemide  40 mg Oral Daily  . heparin  5,000 Units Subcutaneous Q8H  . isosorbide mononitrate  15 mg Oral Daily  . leflunomide  10 mg Oral Daily  . losartan  50 mg Oral Daily  . metoprolol succinate  25 mg Oral Daily  . pantoprazole  80 mg Oral Daily  . potassium chloride  10 mEq Oral Daily  . predniSONE  5 mg Oral Q breakfast  . vitamin C  1,000 mg Oral Daily   Continuous Infusions:  PRN Meds:.albuterol, benzonatate  Antibiotics: Anti-infectives   Start     Dose/Rate Route Frequency Ordered Stop   09/05/13 0800  cefUROXime (CEFTIN) tablet 500 mg  Status:  Discontinued      500 mg Oral 2 times daily with meals 09/05/13 0454 09/05/13 0530   09/05/13 0700  cefTRIAXone (ROCEPHIN) 1 g in dextrose 5 % 50 mL IVPB     1 g 100 mL/hr over 30 Minutes Intravenous Every 24 hours 09/05/13 0533 09/12/13 0659   09/05/13 0700  azithromycin (ZITHROMAX) 500 mg in dextrose 5 % 250 mL IVPB     500 mg 250 mL/hr over 60 Minutes Intravenous Every 24 hours 09/05/13 0533 09/12/13 0659   09/05/13 0545  cefTRIAXone (ROCEPHIN) 1 g in dextrose 5 % 50 mL IVPB  Status:  Discontinued     1 g 100 mL/hr over 30 Minutes Intravenous Every 24 hours 09/05/13 0533 09/05/13 0533   09/05/13 0545  azithromycin (ZITHROMAX) 500 mg in dextrose 5 % 250 mL IVPB  Status:  Discontinued     500 mg 250 mL/hr over 60 Minutes Intravenous Every 24 hours 09/05/13 0533 09/05/13 0533   09/05/13 0200  vancomycin (VANCOCIN) IVPB 1000 mg/200 mL premix     1,000 mg 200 mL/hr over 60 Minutes Intravenous  Once 09/05/13 0146 09/05/13 0617   09/05/13 0200  piperacillin-tazobactam (ZOSYN) IVPB 3.375 g     3.375 g 12.5 mL/hr over 240 Minutes Intravenous  Once 09/05/13 0146 09/05/13 0639       PHYSICAL EXAM: Vital signs in last 24 hours: Filed Vitals:   09/05/13 0530 09/05/13 0641 09/05/13 0909 09/05/13 1216  BP: 118/50 145/76 124/70 109/59  Pulse: 87 81 92 78  Temp:  98.4 F (36.9 C) 98 F (36.7 C) 98.6 F (37 C)  TempSrc:  Oral Oral Oral  Resp: 18 20 20 20   Height:      Weight:  72.848 kg (160 lb 9.6 oz)    SpO2: 97% 95% 94% 94%    Weight change:  Filed Weights   09/04/13 2326 09/05/13 0641  Weight: 75.751 kg (167 lb) 72.848 kg (160 lb 9.6 oz)   Body mass index is 28.46 kg/(m^2).   Gen Exam: Awake and alert with clear speech.   Neck: Supple, No JVD.   Chest: Good air entry bilaterally-prolonged exp, rhonchi. CVS: S1 S2 Regular, no murmurs.  Abdomen: soft, BS +, non tender, non distended.  Extremities: no edema, lower extremities warm to touch. Neurologic: Non Focal.   Skin: No Rash.  Wounds:  N/A.   Intake/Output from previous day:  Intake/Output Summary (Last 24 hours) at 09/05/13 1312 Last data filed at 09/05/13 0900  Gross per 24 hour  Intake    440 ml  Output   1950 ml  Net  -1510 ml     LAB RESULTS: CBC  Recent Labs Lab 09/04/13 0115 09/04/13 2352  WBC 9.0  --   HGB 11.4* 11.2*  HCT 34.3* 33.0*  PLT 168  --   MCV 86.4  --   MCH 28.7  --   MCHC 33.2  --   RDW 14.7  --     Chemistries   Recent Labs Lab 09/04/13 2352  NA 142  K 3.7  CL 108  GLUCOSE 112*  BUN 9  CREATININE 0.90    CBG: No results found for this basename: GLUCAP,  in the last 168 hours  GFR Estimated Creatinine Clearance: 55.6 ml/min (by C-G formula based on Cr of 0.9).  Coagulation profile No results found for this basename: INR, PROTIME,  in the last 168 hours  Cardiac Enzymes No results found for this basename: CK, CKMB, TROPONINI, MYOGLOBIN,  in the last 168 hours  No components found with this basename: POCBNP,  No results found for this basename: DDIMER,  in the last 72 hours No results found for this basename: HGBA1C,  in the last 72 hours No results found for this basename: CHOL, HDL, LDLCALC, TRIG, CHOLHDL, LDLDIRECT,  in the last 72 hours No results found for this basename: TSH, T4TOTAL, FREET3, T3FREE, THYROIDAB,  in the last 72 hours No results found for this basename: VITAMINB12, FOLATE, FERRITIN, TIBC, IRON, RETICCTPCT,  in the last 72 hours No results found for this basename: LIPASE, AMYLASE,  in the last 72 hours  Urine Studies No results found for this basename: UACOL, UAPR, USPG, UPH, UTP, UGL, UKET, UBIL, UHGB, UNIT, UROB, ULEU, UEPI, UWBC, URBC, UBAC, CAST, CRYS, UCOM, BILUA,  in the last 72 hours  MICROBIOLOGY: No results found for this or any previous visit (from the past 240 hour(s)).  RADIOLOGY STUDIES/RESULTS: Ct Angio Chest Pe W/cm &/or Wo Cm  09/05/2013   CLINICAL DATA:  Worsening shortness of breath.  EXAM: CT ANGIOGRAPHY CHEST WITH  CONTRAST  TECHNIQUE: Multidetector CT imaging of the chest was performed using the standard protocol during bolus administration of intravenous contrast. Multiplanar CT image reconstructions including MIPs were obtained to evaluate the vascular anatomy.  CONTRAST:  172mL OMNIPAQUE IOHEXOL 350 MG/ML SOLN  COMPARISON:  DG CHEST 1V PORT dated 09/04/2013; CT CHEST W/O CM dated 12/03/2012  FINDINGS: There is no evidence of pulmonary embolus.  Bilateral subpleural fibrotic changes are again seen, predominantly within the lower lobes, with scattered subpleural ground-glass opacities again seen. Ground-glass opacity  at the right upper lobe has increased in size, and subpleural fibrotic changes are worsened. There is no evidence of pleural effusion or pneumothorax. No masses are identified; no abnormal focal contrast enhancement is seen.  A mildly prominent aortopulmonary window node is seen, measuring 1.2 cm in short axis. Remaining visualized mediastinal nodes are normal in size. No pericardial effusion is identified. Scattered coronary artery calcifications are seen. The great vessels are grossly unremarkable in appearance, aside from scattered calcific atherosclerotic disease. No axillary lymphadenopathy is seen. The visualized portions of the thyroid gland are unremarkable in appearance.  The visualized portions of the liver are unremarkable. The spleen is enlarged and somewhat bulky in appearance. The patient is status post cholecystectomy, with clips noted along the gallbladder fossa.  No acute osseous abnormalities are seen.  Review of the MIP images confirms the above findings.  IMPRESSION: 1. No evidence of pulmonary embolus. 2. Bilateral subpleural fibrotic changes, predominantly within the lower lobes, have worsened from prior studies, with increasing scattered subpleural ground-glass airspace opacities. As before, this could reflect worsening nonspecific interstitial pneumonitis (NSIP) or possibly usual  interstitial pneumonitis (UIP). Superimposed mild acute pneumonia at the right upper lobe cannot be excluded. 3. Mildly prominent aortopulmonary window node seen, nonspecific in appearance. 4. Scattered coronary artery calcifications seen. 5. Splenomegaly noted.   Electronically Signed   By: Garald Balding M.D.   On: 09/05/2013 05:08   Dg Chest Portable 1 View  09/04/2013   CLINICAL DATA:  Shortness of breath  EXAM: PORTABLE CHEST - 1 VIEW  COMPARISON:  08/18/2012  FINDINGS: The heart size is upper limits of normal in size. The mediastinal contours are within normal limits. Both lungs are clear. The visualized skeletal structures are unremarkable.  IMPRESSION: 1. No acute findings. 2. Mild cardiac enlargement   Electronically Signed   By: Kerby Moors M.D.   On: 09/04/2013 23:54    Oren Binet, MD  [facility] Pager:[phone]  If 7PM-7AM, please contact night-coverage [email or web address] Password [email or web address] 09/05/2013, 1:12 PM   LOS: 1 day

## 2013-09-05 NOTE — ED Notes (Signed)
The Tech gave a silver plated quartz watch to the patients daughters. The tech has reported to the RN in charge.

## 2013-09-05 NOTE — ED Notes (Signed)
IV team at bedside 

## 2013-09-05 NOTE — ED Notes (Signed)
Unable to gain appropriate access for CT angio. IV team paged and responded.

## 2013-09-05 NOTE — ED Notes (Signed)
Pt returned from radiology.

## 2013-09-05 NOTE — Progress Notes (Signed)
Attempted twice to get report no answer, Room ready for admit.

## 2013-09-05 NOTE — ED Notes (Signed)
During triage, pt stated that she should have a DNR on file with the hospital. This RN, and two people from medical records looked through files, and a DNR was not found in the system.

## 2013-09-05 NOTE — ED Notes (Signed)
This RN talked with Lab about the missing test results for the pt. Lab states that they never received the blood.

## 2013-09-05 NOTE — ED Notes (Signed)
Colletta Maryland (daughter): [phone] - cell [phone] - home  Clarene Critchley (daughter): [phone]

## 2013-09-06 DIAGNOSIS — I2510 Atherosclerotic heart disease of native coronary artery without angina pectoris: Secondary | ICD-10-CM

## 2013-09-06 LAB — CBC
HEMATOCRIT: 36.5 % (ref 36.0–46.0)
HEMOGLOBIN: 11.9 g/dL — AB (ref 12.0–15.0)
MCH: 27.9 pg (ref 26.0–34.0)
MCHC: 32.6 g/dL (ref 30.0–36.0)
MCV: 85.7 fL (ref 78.0–100.0)
Platelets: 184 10*3/uL (ref 150–400)
RBC: 4.26 MIL/uL (ref 3.87–5.11)
RDW: 14.5 % (ref 11.5–15.5)
WBC: 4.8 10*3/uL (ref 4.0–10.5)

## 2013-09-06 LAB — BASIC METABOLIC PANEL
BUN: 16 mg/dL (ref 6–23)
CALCIUM: 8.5 mg/dL (ref 8.4–10.5)
CO2: 22 mEq/L (ref 19–32)
Chloride: 104 mEq/L (ref 96–112)
Creatinine, Ser: 0.75 mg/dL (ref 0.50–1.10)
GFR calc Af Amer: >90 mL/min (ref >90)
GFR, EST NON AFRICAN AMERICAN: 84 mL/min — AB (ref >90)
Glucose, Bld: 144 mg/dL — ABNORMAL HIGH (ref 70–99)
Potassium: 3.4 mEq/L — ABNORMAL LOW (ref 3.7–5.3)
Sodium: 142 mEq/L (ref 137–147)

## 2013-09-06 LAB — HIV ANTIBODY (ROUTINE TESTING W REFLEX): HIV: NONREACTIVE

## 2013-09-06 MED ORDER — PREDNISONE 10 MG PO TABS: 20.0000 mg | ORAL_TABLET | Freq: Every day | ORAL | Status: DC

## 2013-09-06 MED ORDER — TRAMADOL HCL 50 MG PO TABS
50.0000 mg | ORAL_TABLET | Freq: Once | ORAL | Status: AC
  Administered 2013-09-06: 50 mg via ORAL
  Filled 2013-09-06: qty 1

## 2013-09-06 MED ORDER — AMOXICILLIN-POT CLAVULANATE 875-125 MG PO TABS: 1.0000 | ORAL_TABLET | Freq: Two times a day (BID) | ORAL | Status: DC

## 2013-09-06 MED ORDER — PREDNISONE 5 MG PO TABS: 5.0000 mg | ORAL_TABLET | Freq: Every day | ORAL | Status: DC

## 2013-09-09 NOTE — Discharge Summary (Signed)
Physician Discharge Summary  [name] BMW:[id number] DOB: [birth year] DOA: 09/04/2013  PCP: Vidal Schwalbe, MD  Admit date: 09/04/2013 Discharge date: 09/06/2013  Time spent: 45 minutes  Recommendations for Outpatient Follow-up:  1. PCP in 1 week 2. FU CXR in 4-6weeks  Discharge Diagnoses:    CAP (community acquired pneumonia)   COPD exacerbation   Interstitial lung disease   HTN   CAD-nonobstructive disease   H/o RA  Discharge Condition: stable  Diet recommendation: low sodium  Filed Weights   09/04/13 2326 09/05/13 0641  Weight: 75.751 kg (167 lb) 72.848 kg (160 lb 9.6 oz)    History of present illness:  HPI: [name] is a 73 y.o. female who presents to the ED with SOB. Has had wet sounding cough for the past few days but worse significantly today. Does report subjective fever at home. Saw doctor in office yesterday, had fever at that time, started on ABx for PNA diagnosis. Symptoms have since worsened despite taking ABX at home as prescribed, so she presents to the ED. Also has h/o CHF and COPD. Cough productive of greenish sputum.  Hospital Course:  PNA  -clinically improving  -treated with IV ceftriaxone/zithromax initially and transitioned to Po levaquin at discharge -afebrile now, leukocytosis resolved -blood culture-negative  COPD exacerbation  -wheezing noted on exam yesterday  - hence started on a prednsione to taper off quickly back to usual home regimen   HTN  -controlled-c/w Metropolol, Losartan,Lasix   Non-obstructive CAD  -stable  -ASA, Metoprolol, Statins   Hx of Rheumatoid Arthritis/Inclusion body myositis  -on prednisone, Leflunomide   Interstitial lung disease  -could be related to CAP (community acquired pneumonia) or her RA -needs Fu CXR, advised to Fu with Pulmonary     Discharge Exam: Filed Vitals:   09/06/13 1232  BP: 122/70  Pulse: 86  Temp: 98.2 F (36.8 C)  Resp: 18    General: AAOx3 Cardiovascular:  S1S2/RRR Respiratory: CTAB  Discharge Instructions  Discharge Orders   Future Appointments Provider Department Dept Phone   09/15/2013 4:30 PM Collene Gobble, MD [facility] [phone]   Future Orders Complete By Expires   Diet - low sodium heart healthy  As directed    Increase activity slowly  As directed        Medication List    STOP taking these medications       cefUROXime 500 MG tablet  Commonly known as:  CEFTIN      TAKE these medications       amoxicillin-clavulanate 875-125 MG per tablet  Commonly known as:  AUGMENTIN  Take 1 tablet by mouth 2 (two) times daily. For 5 days     aspirin 81 MG tablet  Take 81 mg by mouth daily.     atorvastatin 80 MG tablet  Commonly known as:  LIPITOR  Take 1 tablet (80 mg total) by mouth daily at 6 PM.     benzonatate 200 MG capsule  Commonly known as:  TESSALON  Take 200 mg by mouth 3 (three) times daily as needed for cough.     Cholecalciferol 2000 UNITS Tabs  Take 4,000 Units by mouth 2 (two) times daily.     ciprofloxacin 250 MG tablet  Commonly known as:  CIPRO     cyanocobalamin 2000 MCG tablet  Take 2,000 mcg by mouth daily.     furosemide 40 MG tablet  Commonly known as:  LASIX  Take 40 mg by mouth daily.  isosorbide mononitrate 30 MG 24 hr tablet  Commonly known as:  IMDUR  Take 0.5 tablets (15 mg total) by mouth daily.     leflunomide 20 MG tablet  Commonly known as:  ARAVA  Take 10 mg by mouth daily.     losartan 50 MG tablet  Commonly known as:  COZAAR  TAKE 1 TABLET (50 MG TOTAL) BY MOUTH DAILY.     metoprolol succinate 25 MG 24 hr tablet  Commonly known as:  TOPROL-XL  TAKE 1 TABLET (25 MG TOTAL) BY MOUTH DAILY. TAKE WITH OR IMMEDIATELY FOLLOWING A MEAL.     MUCUS RELIEF DM 20-400 MG Tabs  Generic drug:  Dextromethorphan-Guaifenesin  Take 1 tablet by mouth every 4 (four) hours as needed (couh and congestion).     NITROSTAT 0.4 MG SL tablet  Generic drug:  nitroGLYCERIN   PLACE 1 TABLET (0.4 MG TOTAL) UNDER THE TONGUE EVERY 5 (FIVE) MINUTES AS NEEDED FOR CHEST PAIN.     pantoprazole 40 MG tablet  Commonly known as:  PROTONIX  Take 80 mg by mouth daily.     potassium chloride 10 MEQ tablet  Commonly known as:  KLOR-CON 10  TAKE 2 TABLETS (20 MEQ TOTAL) BY MOUTH DAILY.     predniSONE 5 MG tablet  Commonly known as:  DELTASONE  Take 1 tablet (5 mg total) by mouth daily with breakfast. Resume after 4 days     predniSONE 10 MG tablet  Commonly known as:  DELTASONE  Take 2-3 tablets (20-30 mg total) by mouth daily with breakfast. 30mg  for 2days then 20mg  for 2days then STOP     PROAIR HFA 108 (90 BASE) MCG/ACT inhaler  Generic drug:  albuterol  Inhale 2 puffs into the lungs every 6 (six) hours as needed. For shortness breath     ranitidine 300 MG tablet  Commonly known as:  ZANTAC  Take 300 mg by mouth at bedtime.     trimethoprim 100 MG tablet  Commonly known as:  TRIMPEX  Take 100 mg by mouth daily.     vitamin C 500 MG tablet  Commonly known as:  ASCORBIC ACID  Take 1,000 mg by mouth daily.       Allergies  Allergen Reactions  . Methotrexate Derivatives Shortness Of Breath and Other (See Comments)    Caused build up of scarring in  Lungs.  Causes problems with elevated liver enzymes.  . Codeine Itching    Causes minor itching, patient can take with benadryl  . Imuran [Azathioprine] Nausea And Vomiting  . Levofloxacin Swelling  . Lunesta [Eszopiclone] Nausea And Vomiting  . Nitrofurantoin Itching    *MACROBID*  Tongue swelling       Follow-up Information   Follow up with Collene Gobble., MD. Schedule an appointment as soon as possible for a visit in 2 weeks.   Specialty:  Pulmonary Disease   Contact information:   [address] [place] [address] [phone]        The results of significant diagnostics from this hospitalization (including imaging, microbiology, ancillary and laboratory) are listed below for  reference.    Significant Diagnostic Studies: Ct Angio Chest Pe W/cm &/or Wo Cm  09/05/2013   CLINICAL DATA:  Worsening shortness of breath.  EXAM: CT ANGIOGRAPHY CHEST WITH CONTRAST  TECHNIQUE: Multidetector CT imaging of the chest was performed using the standard protocol during bolus administration of intravenous contrast. Multiplanar CT image reconstructions including MIPs were obtained to evaluate the vascular anatomy.  CONTRAST:  134mL OMNIPAQUE IOHEXOL 350 MG/ML SOLN  COMPARISON:  DG CHEST 1V PORT dated 09/04/2013; CT CHEST W/O CM dated 12/03/2012  FINDINGS: There is no evidence of pulmonary embolus.  Bilateral subpleural fibrotic changes are again seen, predominantly within the lower lobes, with scattered subpleural ground-glass opacities again seen. Ground-glass opacity at the right upper lobe has increased in size, and subpleural fibrotic changes are worsened. There is no evidence of pleural effusion or pneumothorax. No masses are identified; no abnormal focal contrast enhancement is seen.  A mildly prominent aortopulmonary window node is seen, measuring 1.2 cm in short axis. Remaining visualized mediastinal nodes are normal in size. No pericardial effusion is identified. Scattered coronary artery calcifications are seen. The great vessels are grossly unremarkable in appearance, aside from scattered calcific atherosclerotic disease. No axillary lymphadenopathy is seen. The visualized portions of the thyroid gland are unremarkable in appearance.  The visualized portions of the liver are unremarkable. The spleen is enlarged and somewhat bulky in appearance. The patient is status post cholecystectomy, with clips noted along the gallbladder fossa.  No acute osseous abnormalities are seen.  Review of the MIP images confirms the above findings.  IMPRESSION: 1. No evidence of pulmonary embolus. 2. Bilateral subpleural fibrotic changes, predominantly within the lower lobes, have worsened from prior studies, with  increasing scattered subpleural ground-glass airspace opacities. As before, this could reflect worsening nonspecific interstitial pneumonitis (NSIP) or possibly usual interstitial pneumonitis (UIP). Superimposed mild acute pneumonia at the right upper lobe cannot be excluded. 3. Mildly prominent aortopulmonary window node seen, nonspecific in appearance. 4. Scattered coronary artery calcifications seen. 5. Splenomegaly noted.   Electronically Signed   By: Garald Balding M.D.   On: 09/05/2013 05:08   Dg Chest Portable 1 View  09/04/2013   CLINICAL DATA:  Shortness of breath  EXAM: PORTABLE CHEST - 1 VIEW  COMPARISON:  08/18/2012  FINDINGS: The heart size is upper limits of normal in size. The mediastinal contours are within normal limits. Both lungs are clear. The visualized skeletal structures are unremarkable.  IMPRESSION: 1. No acute findings. 2. Mild cardiac enlargement   Electronically Signed   By: Kerby Moors M.D.   On: 09/04/2013 23:54    Microbiology: Recent Results (from the past 240 hour(s))  CULTURE, BLOOD (ROUTINE X 2)     Status: None   Collection Time    09/05/13  8:20 AM      Result Value Range Status   Specimen Description BLOOD RIGHT ANTECUBITAL   Final   Special Requests BOTTLES DRAWN AEROBIC AND ANAEROBIC 10CC   Final   Culture  Setup Time     Final   Value: 09/05/2013 14:43     Performed at [facility]   Culture     Final   Value:        BLOOD CULTURE RECEIVED NO GROWTH TO DATE CULTURE WILL BE HELD FOR 5 DAYS BEFORE ISSUING A FINAL NEGATIVE REPORT     Performed at [facility]   Report Status PENDING   Incomplete  CULTURE, BLOOD (ROUTINE X 2)     Status: None   Collection Time    09/05/13  8:40 AM      Result Value Range Status   Specimen Description BLOOD LEFT HAND   Final   Special Requests BOTTLES DRAWN AEROBIC AND ANAEROBIC 10CC   Final   Culture  Setup Time     Final   Value: 09/05/2013 14:43     Performed  at [facility]      Final   Value:        BLOOD CULTURE RECEIVED NO GROWTH TO DATE CULTURE WILL BE HELD FOR 5 DAYS BEFORE ISSUING A FINAL NEGATIVE REPORT     Performed at [facility]   Report Status PENDING   Incomplete     Labs: Basic Metabolic Panel:  Recent Labs Lab 09/04/13 2352 09/06/13 0440  NA 142 142  K 3.7 3.4*  CL 108 104  CO2  --  22  GLUCOSE 112* 144*  BUN 9 16  CREATININE 0.90 0.75  CALCIUM  --  8.5   Liver Function Tests: No results found for this basename: AST, ALT, ALKPHOS, BILITOT, PROT, ALBUMIN,  in the last 168 hours No results found for this basename: LIPASE, AMYLASE,  in the last 168 hours No results found for this basename: AMMONIA,  in the last 168 hours CBC:  Recent Labs Lab 09/04/13 0115 09/04/13 2352 09/06/13 0440  WBC 9.0  --  4.8  HGB 11.4* 11.2* 11.9*  HCT 34.3* 33.0* 36.5  MCV 86.4  --  85.7  PLT 168  --  184   Cardiac Enzymes: No results found for this basename: CKTOTAL, CKMB, CKMBINDEX, TROPONINI,  in the last 168 hours BNP: BNP (last 3 results)  Recent Labs  09/04/13 0115  PROBNP 1403.0*   CBG: No results found for this basename: GLUCAP,  in the last 168 hours     Signed:  Josean Lycan  [facility] 09/09/2013, 5:22 PM

## 2013-09-11 LAB — CULTURE, BLOOD (ROUTINE X 2)
Culture: NO GROWTH
Culture: NO GROWTH

## 2013-09-15 ENCOUNTER — Encounter: Payer: Self-pay | Admitting: Emergency Medicine

## 2013-09-15 ENCOUNTER — Ambulatory Visit (INDEPENDENT_AMBULATORY_CARE_PROVIDER_SITE_OTHER): Payer: Medicare Other | Admitting: Emergency Medicine

## 2013-09-15 VITALS — BP 120/86 | HR 95 | Ht 63.5 in | Wt 157.0 lb

## 2013-09-15 DIAGNOSIS — J8410 Pulmonary fibrosis, unspecified: Secondary | ICD-10-CM

## 2013-09-15 DIAGNOSIS — J4489 Other specified chronic obstructive pulmonary disease: Secondary | ICD-10-CM

## 2013-09-15 DIAGNOSIS — J449 Chronic obstructive pulmonary disease, unspecified: Secondary | ICD-10-CM

## 2013-09-15 DIAGNOSIS — J849 Interstitial pulmonary disease, unspecified: Secondary | ICD-10-CM

## 2013-09-15 MED ORDER — ALBUTEROL SULFATE (2.5 MG/3ML) 0.083% IN NEBU: 2.5000 mg | INHALATION_SOLUTION | Freq: Four times a day (QID) | RESPIRATORY_TRACT | Status: DC | PRN

## 2013-09-15 MED ORDER — ALBUTEROL SULFATE 1.25 MG/3ML IN NEBU: 1.0000 | INHALATION_SOLUTION | Freq: Four times a day (QID) | RESPIRATORY_TRACT | Status: AC | PRN

## 2013-09-15 NOTE — Progress Notes (Signed)
Subjective:    Patient ID: [name], female    DOB: 09-Feb-1941, 73 y.o.   MRN: [id number]  Shortness of Breath Pertinent negatives include no ear pain, fever, headaches, leg swelling, rash, rhinorrhea, sore throat, vomiting or wheezing.   73 yo former smoker, hx carotid disease, RA diagnosed by Dr Trudie Reed in June '11, started on MTX in 7/11.  No evidence CAD by myoview in 5/10. Also hx PE in 1988 after a bladder surgery, anti-coag x 38mo.  Referred by Dr Dema Severin to evaluate SOB. She tells me that her breathing had some minor limitations up to 2 yrs ago. This improved some when she was treated for Vit D and B12 deficiency. Then became much more bothersome Summer 2010 when she started Prednisone, followed by MTX for her RA. She has gained about 30 lbs since the Summer. She believes that the SOB relates to the initiation of these meds. She was started on Spiriva 12/11 ago - no difference in her breathing.   ROV 08/23/10 -- Hx tobacco, follows up for evaluation of SOB. We performed CBC that showed no anemia, CXR without evidence for ILD. Tells me that MTX was stopped last Friday, still on pred. Having some nasal congestion today. Her breathing is still difficult w exertion. We stopped Spiriva last time, she doesn't miss it. Not on any other BDs.   ROV 01/03/11 -- hx tobacco, RA on MTX. CAD being followed by Dr Ron Parker. L and R cath in 2/12 showed CAD, normal LV and RV pressures. Has restriction vs mixed disease - did not improve with Spiriva and albuterol. Has been off prednisone since January. She had a CT scan chest that showed very subtle subpleural interstitial changes.   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 Also mixed dz on PFT but no clinical response to Spiriva + SABA. Returns for f/u CT scan chest done 06/28/11. She tells me that she has been more SOB since last visit, was admitted to [facility] 10/12 for dyspnea and chest pain, was felt to be volume  overloaded and responded to diuretics - improved resp status. Her LV fxn has been normal by TTE, has diastolic dysfxn and A Fib as potential causes (? Related to Embrel also - started 3-4 months ago). She remains on MTX. Her CT scan shows progression of sub-pleural fibrosis. Her MTX was increased last week - she isn't certain that the MTX has every helped her.   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 Also mixed dz on PFT but no clinical response to Spiriva + SABA. Her Embrel was stopped due to CHF exacerbation, but she is still on MTX. Her breathing hasn't really changed very much off the Embrel. She was temporarily off of MTX for three weeks, thought that her breathing was better. Her last CT scan 11/12 showed subpleural ILD, ? Related to MTX.   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 Also mixed dz on PFT but no clinical response to Spiriva + SABA. Her MTX was stopped by Dr Trudie Reed 10/2011. She continues to have low energy. Breathing is about the same, still able to walk 2 miles every day. Last TTE was 2011.   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 Also mixed dz on PFT but no clinical response to Spiriva + SABA. Repeat  TTE 4/24 showed estimated RVSP . She is still able to walk her two miles, but she has dyspnea with chores, sweeping, etc. Her wt has gone up over the last year, probably 7-8 lbs.   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 Also mixed dz on PFT but no clinical response to Spiriva + SABA.  Returns after repeat CT scan of the chest to eval her ILD and nodules >> stable. She tells me today that her breathing has been stable to improved.  She was started on Pred for the last 2 weeks for joint pain - also seems to have made her breathing better.   ROV 08/08/12 -- Hx RA and mild subpleural ILD (without  nodules), has been on MTX (currently off). Also mixed dz on PFT but no clinical response to Spiriva + SABA. Last time discussed retrying albuterol for DOE. She tells me that since last visit she was admitted for NSTEMI. L cath done without intervention. 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   OV 11/20/12 (MR) Acute visit for Dr. Delton Coombes patient with NEW PROBLEM OF DYSPNEA.  Has rheumatoid arthritis with interstitial lung disease not otherwise specified. She has been off methotrexate and Enbrel for over a year. For the last several months she's had generalized muscle weakness. 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 Then 3 weeks ago was started on prednisone 60 mg once a day. Since then 15 pound weight gain and insidious onset of dyspnea that is progressively worse. At baseline she can walk one or 2 miles. However, now she is unable to do activities of daily living without dyspnea. Dyspnea is relieved by rest and rated as moderate in severity. There is no associated cough or chest pain but that is associated weight gain. 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 Seen as above by Dr MR for progressive dyspnea. Repeat CT scan performed and stable as below. She has not had full PFT yet. 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 She has gained more wt - 30 lbs over last 3 months. She c/o nausea, exertional dyspnea. Feels like she is going to "pass out". The sx correspond with the increase in imuran.   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 Hx of CAD and an improved Takotsubo CM (last EF 50%). Hx remote PE. Has had trouble with more dyspnea since March '14. This seemed to associate with medication changes and wt gain. She  has been taken off imuran, started back on pred, Arava - now following w Dr Jon Billings. Her wt has been stable since last time. Less peripheral edema compared with last time. Her energy level is better today, SOB is the same and possibly worse than last visit.   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 Hx of CAD and an improved Takotsubo CM (last EF 50%). Hx remote PE. She remains on Pred 5, Arava. She is having cough but it is better than at time of hosp. Her breathing is at baseline. Does not use ProAir > has trouble using it, her hands aren't strong enough to activate the HFA.  09/05/13 CT scan  COMPARISON: DG CHEST 1V PORT dated 09/04/2013; CT CHEST W/O CM dated  12/03/2012  FINDINGS:  There is no evidence of pulmonary embolus.  Bilateral subpleural fibrotic changes are again seen, predominantly  within the lower lobes, with scattered subpleural ground-glass  opacities again seen. Ground-glass opacity at the right upper lobe  has increased in size, and subpleural fibrotic changes are worsened.  There is no evidence of pleural effusion or pneumothorax. No masses  are identified; no abnormal focal contrast enhancement is seen.  A mildly prominent aortopulmonary window node is seen, measuring 1.2  cm in short axis. Remaining visualized mediastinal nodes are normal  in size. No pericardial effusion is identified. Scattered coronary  artery calcifications are seen. The great vessels are grossly  unremarkable in appearance, aside from scattered calcific  atherosclerotic disease. No axillary lymphadenopathy is seen. The  visualized portions of the thyroid gland are unremarkable in  appearance.  The visualized portions of the liver are unremarkable. The spleen is  enlarged and somewhat bulky in appearance. The patient is status  post cholecystectomy, with clips noted along the gallbladder fossa.  No acute osseous  abnormalities are seen.  Review of the MIP images confirms the above findings.  IMPRESSION:  1. No evidence of pulmonary embolus.  2. Bilateral subpleural fibrotic changes, predominantly within the  lower lobes, have worsened from prior studies, with increasing  scattered subpleural ground-glass airspace opacities. As before,  this could reflect worsening nonspecific interstitial pneumonitis  (NSIP) or possibly usual interstitial pneumonitis (UIP).  Superimposed mild acute pneumonia at the right upper lobe cannot be  excluded.  3. Mildly prominent aortopulmonary window node seen, nonspecific in  appearance.  4. Scattered coronary artery calcifications seen.  5. Splenomegaly noted.   Review of Systems  Constitutional: Positive for unexpected weight change. Negative for fever.  HENT: Negative for congestion, dental problem, ear pain, nosebleeds, postnasal drip, rhinorrhea, sinus pressure, sneezing, sore throat and trouble swallowing.   Eyes: Negative for redness and itching.  Respiratory: Positive for shortness of breath. Negative for cough, chest tightness and wheezing.   Cardiovascular: Negative for palpitations and leg swelling.  Gastrointestinal: Negative for nausea and vomiting.  Genitourinary: Negative for dysuria.  Musculoskeletal: Negative for joint swelling.  Skin: Negative for rash.  Neurological: Negative for headaches.  Hematological: Does not bruise/bleed easily.  Psychiatric/Behavioral: Negative for dysphoric mood. The patient is not nervous/anxious.        Objective:   Physical Exam Filed Vitals:   09/15/13 1634  BP: 120/86  Pulse: 95  Height: 5' 3.5" (1.613 m)  Weight: 157 lb (71.215 kg)  SpO2: 100%   Gen: Pleasant, obese, in no distress,  normal affect; she has gained wt since last visit  ENT: No lesions,  mouth clear,  oropharynx clear, no postnasal drip; cushingoid faces  Neck: No JVD, no TMG, no carotid bruits  Lungs: R > L basilar insp crackles,  clear superiorly  Cardiovascular: RRR, heart sounds normal, no murmur or gallops, no LE edema  Musculoskeletal: No deformities, no cyanosis or clubbing  Neuro: alert, non focal  Skin: Warm, no lesions or rashes     Assessment & Plan:  Interstitial lung disease For the most part stable on most recent CT scan 1/'15. Likely due to RA and hx MTX.  - follow clinically and by imaging  COPD (chronic obstructive pulmonary disease) We will start albuterol nebs prn since the [facility] is difficult for her to use.

## 2013-09-15 NOTE — Addendum Note (Signed)
Addended by: Carlos American A on: 09/15/2013 05:15 PM   Modules accepted: Orders

## 2013-09-15 NOTE — Addendum Note (Signed)
Addended by: Carlos American A on: 09/15/2013 05:08 PM   Modules accepted: Orders

## 2013-09-15 NOTE — Patient Instructions (Addendum)
Please continue your current medications  You may use albuterol through nebulizer or 2 puffs from your inhaler if needed for shortness of breath.   Follow with Dr Lamonte Sakai in 4 months or sooner if you have any problems.

## 2013-09-15 NOTE — Assessment & Plan Note (Signed)
For the most part stable on most recent CT scan 1/'15. Likely due to RA and hx MTX.  - follow clinically and by imaging

## 2013-09-15 NOTE — Assessment & Plan Note (Addendum)
We will start albuterol nebs prn since the [facility] is difficult for her to use.

## 2013-09-23 ENCOUNTER — Other Ambulatory Visit: Payer: Self-pay | Admitting: Gastroenterology

## 2013-09-24 ENCOUNTER — Other Ambulatory Visit: Payer: Self-pay | Admitting: Gastroenterology

## 2013-11-24 ENCOUNTER — Other Ambulatory Visit: Payer: Self-pay | Admitting: Physical Medicine and Rehabilitation

## 2013-11-24 DIAGNOSIS — M79604 Pain in right leg: Secondary | ICD-10-CM

## 2013-11-26 ENCOUNTER — Ambulatory Visit
Admission: RE | Admit: 2013-11-26 | Discharge: 2013-11-26 | Disposition: A | Discharge status: Home / Self Care | Payer: Medicare Other | Source: Ambulatory Visit | Attending: Physical Medicine and Rehabilitation | Admitting: Physical Medicine and Rehabilitation

## 2013-11-26 DIAGNOSIS — M79604 Pain in right leg: Secondary | ICD-10-CM

## 2014-01-11 ENCOUNTER — Encounter: Payer: Self-pay | Admitting: Cardiology

## 2014-01-11 ENCOUNTER — Ambulatory Visit (INDEPENDENT_AMBULATORY_CARE_PROVIDER_SITE_OTHER): Payer: Medicare Other | Admitting: Cardiology

## 2014-01-11 VITALS — BP 132/53 | HR 90 | Ht 63.0 in | Wt 161.0 lb

## 2014-01-11 DIAGNOSIS — I10 Essential (primary) hypertension: Secondary | ICD-10-CM

## 2014-01-11 DIAGNOSIS — I739 Peripheral vascular disease, unspecified: Secondary | ICD-10-CM

## 2014-01-11 DIAGNOSIS — I5181 Takotsubo syndrome: Secondary | ICD-10-CM

## 2014-01-11 DIAGNOSIS — I779 Disorder of arteries and arterioles, unspecified: Secondary | ICD-10-CM

## 2014-01-11 DIAGNOSIS — I2510 Atherosclerotic heart disease of native coronary artery without angina pectoris: Secondary | ICD-10-CM

## 2014-01-11 NOTE — Progress Notes (Signed)
Patient ID: [name], female   DOB: [birth year], 73 y.o.   MRN: [id number]    HPI  Patient is seen today to follow coronary disease. She had episode of Takotsubo in the past. She recovered LV function to normal level. Nuclear scan November, 2014 revealed no scar or ischemia. Her ejection fraction is normal. She's been hospitalized with a pneumonia since I saw her last. She seen by pulmonary recently. She does have difficulties secondary to her rheumatoid arthritis on methotrexate therapy. She received a nebulizer. She is fatigued but not having any cardiac symptoms. She does have intermittent trace edema.  Allergies  Allergen Reactions  . Methotrexate Derivatives Shortness Of Breath and Other (See Comments)    Caused build up of scarring in  Lungs.  Causes problems with elevated liver enzymes.  . Codeine Itching    Causes minor itching, patient can take with benadryl  . Imuran [Azathioprine] Nausea And Vomiting  . Levofloxacin Swelling  . Lunesta [Eszopiclone] Nausea And Vomiting  . Nitrofurantoin Itching    *MACROBID*  Tongue swelling    Current Outpatient Prescriptions  Medication Sig Dispense Refill  . albuterol (ACCUNEB) 1.25 MG/3ML nebulizer solution Take 3 mLs (1.25 mg total) by nebulization every 6 (six) hours as needed for wheezing.  75 mL  12  . albuterol (PROVENTIL) (2.5 MG/3ML) 0.083% nebulizer solution Take 3 mLs (2.5 mg total) by nebulization every 6 (six) hours as needed for wheezing.  360 mL  6  . Ascorbic Acid (VITAMIN C) 500 MG tablet Take 1,000 mg by mouth daily.       Marland Kitchen aspirin 81 MG tablet Take 81 mg by mouth daily.        Marland Kitchen atorvastatin (LIPITOR) 80 MG tablet Take 1 tablet (80 mg total) by mouth daily at 6 PM.  30 tablet  6  . Cholecalciferol 2000 UNITS TABS Take 4,000 Units by mouth 2 (two) times daily.      . furosemide (LASIX) 40 MG tablet Take 40 mg by mouth daily.      . isosorbide mononitrate (IMDUR) 30 MG 24 hr tablet Take 0.5 tablets (15 mg total) by  mouth daily.  135 tablet  3  . leflunomide (ARAVA) 20 MG tablet Take 20 mg by mouth daily.       Marland Kitchen losartan (COZAAR) 50 MG tablet TAKE 1 TABLET (50 MG TOTAL) BY MOUTH DAILY.  90 tablet  0  . metoprolol succinate (TOPROL-XL) 25 MG 24 hr tablet TAKE 1 TABLET (25 MG TOTAL) BY MOUTH DAILY. TAKE WITH OR IMMEDIATELY FOLLOWING A MEAL.  90 tablet  1  . NITROSTAT 0.4 MG SL tablet PLACE 1 TABLET (0.4 MG TOTAL) UNDER THE TONGUE EVERY 5 (FIVE) MINUTES AS NEEDED FOR CHEST PAIN.  25 tablet  1  . pantoprazole (PROTONIX) 40 MG tablet Take 80 mg by mouth daily.      . potassium chloride (KLOR-CON 10) 10 MEQ tablet TAKE 2 TABLETS (20 MEQ TOTAL) BY MOUTH DAILY.  180 tablet  1  . PROAIR HFA 108 (90 BASE) MCG/ACT inhaler Inhale 2 puffs into the lungs every 6 (six) hours as needed. For shortness breath      . ranitidine (ZANTAC) 300 MG tablet Take 300 mg by mouth at bedtime.      Marland Kitchen trimethoprim (TRIMPEX) 100 MG tablet Take 100 mg by mouth daily.      . [DISCONTINUED] Etanercept (ENBREL [place]) Inject 1 mg into the skin. Once a week  No current facility-administered medications for this visit.    History   Social History  . Marital Status: Widowed    Spouse Name: N/A    Number of Children: 2  . Years of Education: N/A   Occupational History  . retired    Social History Main Topics  . Smoking status: Former Smoker -- 1.00 packs/day for 30 years    Quit date: 08/14/2007  . Smokeless tobacco: Never Used  . Alcohol Use: No  . Drug Use: No  . Sexual Activity: Not on file   Other Topics Concern  . Not on file   Social History Narrative   Single widow   Alcohol Use - no   Retired from [employer]   Patient is a former smoker. -stopped about 3 years    Illicit Drug Use - no   Patient does not get regular exercise.     Family History  Problem Relation Age of Onset  . Arthritis Brother   . Colon cancer Paternal Grandmother   . Lung cancer Father     x 3 uncles and a aunt  . Brain cancer  Father   . Heart disease Mother   . Kidney disease Mother     chronic UTI  . Diabetes Mother   . Diabetes Sister   . Ovarian cancer Maternal Aunt   . Heart disease Maternal Uncle   . Kidney cancer Maternal Uncle   . Heart disease Paternal Aunt   . Stomach cancer Paternal Aunt   . Pancreatic cancer Maternal Grandmother   . Esophageal cancer Neg Hx   . Rectal cancer Neg Hx     Past Medical History  Diagnosis Date  . Pulmonary embolism     a. 07/2012 CTA chest negative for PE.  Marland Kitchen Rheumatoid arthritis(714.0)   . Hypertension   . Incontinence of feces   . Unspecified disorder of bladder   . Rectal prolapse   . Hiatal hernia   . IBS (irritable bowel syndrome)   . GERD (gastroesophageal reflux disease)   . Pain in limb   . Lumbago     hospital admission with back pain in April, 2011 repair of a disc herniation L4-L5   . Osteoarthrosis, unspecified whether generalized or localized, unspecified site   . COPD (chronic obstructive pulmonary disease)   . Tobacco use disorder   . CAD (coronary artery disease) Non-obstructive    a. Nuclear, May, 2010, no ischemia;  b. cath 2012, nonobstructive CAD ( 40% circumflex, 40% LAD) EF normal;  c. 07/2012 NSTEMI/Cath: nonobs dzs, EF 40% w/ periapical AK.;  d.  [facility] Adam Myoview (11/14): EF 61%, no ischemia, normal study.  . Carotid artery disease     a. Doppler, July, 2010, 40-59% bilateral, b. Dopplers 6/13:  40-59% bilat (f/u  01/2014)  . Adenomatous polyp 02/2008  . AVM (arteriovenous malformation)     Small colonic  . Shortness of breath     Cardiac evaluation, Dr.Katz, pulmonary evaluation, 2012, Dr. Lamonte Sakai.  Marland Kitchen UTI (lower urinary tract infection)     Possible UTI by history June 14, 2011,   . Gout   . Shoulder pain, right   . Spinal stenosis   . Hyperlipidemia   . Glaucoma   . Splenomegaly   . Fatty infiltration of liver   . Cystitis   . Takotsubo cardiomyopathy Presumed    a. 07/2012 NSTEMI/Cath (nonobs dzs)/Echo:EF 45-50%,  mid-distal antsept AK w/ Gr 1 DD.;  b.  Echo 09/2012: EF 55%, mild  LAE  . Dysphagia     a. s/p prior esophageal dilatation. //  Dysarthria and dysphasia hospitalization December, 2013, neurology workup ongoing  . CHF (congestive heart failure)   . Pneumonia     hx of; has not had since taken pneumonia vaccine  . Elevated CPK     Been evaluated with muscle biopsy, February, 2014  . Speech abnormality     January, 2014, question small CVA  . Depression   . Inclusion body myositis     Past Surgical History  Procedure Laterality Date  . Laminectomy      lumbar, HX of L4-5 x 2  . Cervical disc compression      c5-c6 Hx of  . Appendectomy    . Cholecystectomy    . Bladder surgery      x 2  . Vaginal hysterectomy    . Rectocele repair    . Interstim medronic stimulator  04/2010  . Back surgery      x 2  . Eye surgery      bilateral cataract removal  . Cardiac catheterization  07/28/12    no PCI  . Colonoscopy    . Muscle biopsy  09/17/2012    Procedure: GASTROCNEMIUS MUSCLE BIOPSY;  Surgeon: Winfield Cunas, MD;  Location: [facility];  Service: Neurosurgery;  Laterality: Right;  RIGHT DELTOID muscle biopsy    Patient Active Problem List   Diagnosis Date Noted  . Shortness of breath     Priority: High  . Ejection fraction     Priority: High  . Carotid artery disease     Priority: High  . Hypertension     Priority: High  . CAD (coronary artery disease)     Priority: High  . Mouth sores 12/26/2012  . 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 Nausea with vomiting 12/19/2012  . Dyspnea 11/25/2012  . Speech abnormality   . Elevated CPK   . Dysphagia   . Takotsubo cardiomyopathy   . Interstitial lung disease 07/02/2011  . Pulmonary embolism   . Rheumatoid arthritis   . COPD (chronic obstructive pulmonary disease)   . Tobacco use disorder   . AVM (arteriovenous malformation)   . PREMATURE VENTRICULAR CONTRACTIONS 10/11/2010  . OSTEOARTHRITIS 07/24/2010  .  HEMORRHOIDS-INTERNAL 07/24/2010  . ANGIODYSPLASIA-INTESTINE 07/24/2010  . ABDOMINAL PAIN, LEFT UPPER QUADRANT 07/24/2010  . PERSONAL HISTORY OF COLONIC POLYPS 07/24/2010  . Other acquired absence of organ 11/16/2008  . RECTAL PROLAPSE 02/10/2008  . BLADDER PROLAPSE 02/10/2008  . INCONTINENCE OF FECES 02/10/2008  . GASTROESOPHAGEAL REFLUX DISEASE 02/09/2008  . HIATAL HERNIA 02/09/2008  . IRRITABLE BOWEL SYNDROME 02/09/2008    ROS   Patient denies fever, chills, headache, sweats, rash, change in vision, change in hearing, chest pain, cough, nausea vomiting, urinary symptoms. All other systems are reviewed and are negative.  PHYSICAL EXAM  Patient is oriented to person time and place. Affect is normal. She is overweight. Head is atraumatic. Sclera and conjunctiva are normal. There is no jugulovenous distention. Lungs reveal a few scattered rhonchi. There is no respiratory distress. Cardiac exam reveals S1-S2. Abdomen is soft. There is trace peripheral edema. There are no skin rashes.  Filed Vitals:   01/11/14 1535  BP: 132/53  Pulse: 90  Height: 5\' 3"  (1.6 m)  Weight: 161 lb (73.029 kg)     ASSESSMENT & PLAN

## 2014-01-11 NOTE — Patient Instructions (Signed)
**Note De-identified  Obfuscation** Your physician recommends that you continue on your current medications as directed. Please refer to the Current Medication list given to you today.  Your physician has requested that you have a carotid duplex. This test is an ultrasound of the carotid arteries in your neck. It looks at blood flow through these arteries that supply the brain with blood. Allow one hour for this exam. There are no restrictions or special instructions.  Your physician wants you to follow-up in: 6 months. You will receive a reminder letter in the mail two months in advance. If you don't receive a letter, please call our office to schedule the follow-up appointment.  

## 2014-01-11 NOTE — Assessment & Plan Note (Signed)
Blood pressure is controlled. No change in therapy. 

## 2014-01-11 NOTE — Assessment & Plan Note (Signed)
Carotids were stable in June, 2013. He will soon be time for a 2 year followup. This will be arranged.

## 2014-01-11 NOTE — Assessment & Plan Note (Signed)
Coronary disease is stable. No change in therapy. 

## 2014-01-11 NOTE — Assessment & Plan Note (Signed)
Patient recovered completely 5 followup echo

## 2014-01-18 ENCOUNTER — Inpatient Hospital Stay (HOSPITAL_COMMUNITY)
Admission: EM | Admit: 2014-01-18 | Discharge: 2014-02-10 | DRG: 023 | Disposition: E | Payer: Medicare Other | Attending: Neurology | Admitting: Neurology

## 2014-01-18 ENCOUNTER — Inpatient Hospital Stay (HOSPITAL_COMMUNITY): Payer: Medicare Other

## 2014-01-18 ENCOUNTER — Other Ambulatory Visit: Payer: Self-pay

## 2014-01-18 ENCOUNTER — Emergency Department (HOSPITAL_COMMUNITY): Payer: Medicare Other

## 2014-01-18 ENCOUNTER — Encounter (HOSPITAL_COMMUNITY): Admission: EM | Disposition: E | Payer: Self-pay | Source: Home / Self Care | Attending: Neurology

## 2014-01-18 ENCOUNTER — Encounter (HOSPITAL_COMMUNITY): Payer: Medicare Other | Admitting: Certified Registered Nurse Anesthetist

## 2014-01-18 ENCOUNTER — Ambulatory Visit (HOSPITAL_BASED_OUTPATIENT_CLINIC_OR_DEPARTMENT_OTHER): Payer: Medicare Other | Admitting: Cardiology

## 2014-01-18 ENCOUNTER — Encounter (HOSPITAL_COMMUNITY): Payer: Self-pay | Admitting: Anesthesiology

## 2014-01-18 ENCOUNTER — Emergency Department (HOSPITAL_COMMUNITY): Payer: Medicare Other | Admitting: Certified Registered Nurse Anesthetist

## 2014-01-18 DIAGNOSIS — Z87891 Personal history of nicotine dependence: Secondary | ICD-10-CM

## 2014-01-18 DIAGNOSIS — J449 Chronic obstructive pulmonary disease, unspecified: Secondary | ICD-10-CM | POA: Diagnosis present

## 2014-01-18 DIAGNOSIS — Z801 Family history of malignant neoplasm of trachea, bronchus and lung: Secondary | ICD-10-CM

## 2014-01-18 DIAGNOSIS — I6340 Cerebral infarction due to embolism of unspecified cerebral artery: Secondary | ICD-10-CM | POA: Diagnosis present

## 2014-01-18 DIAGNOSIS — M069 Rheumatoid arthritis, unspecified: Secondary | ICD-10-CM | POA: Diagnosis present

## 2014-01-18 DIAGNOSIS — K589 Irritable bowel syndrome without diarrhea: Secondary | ICD-10-CM | POA: Diagnosis present

## 2014-01-18 DIAGNOSIS — J9600 Acute respiratory failure, unspecified whether with hypoxia or hypercapnia: Secondary | ICD-10-CM | POA: Diagnosis not present

## 2014-01-18 DIAGNOSIS — G8190 Hemiplegia, unspecified affecting unspecified side: Secondary | ICD-10-CM | POA: Diagnosis present

## 2014-01-18 DIAGNOSIS — Z7982 Long term (current) use of aspirin: Secondary | ICD-10-CM

## 2014-01-18 DIAGNOSIS — I63512 Cerebral infarction due to unspecified occlusion or stenosis of left middle cerebral artery: Secondary | ICD-10-CM

## 2014-01-18 DIAGNOSIS — I509 Heart failure, unspecified: Secondary | ICD-10-CM | POA: Diagnosis present

## 2014-01-18 DIAGNOSIS — R471 Dysarthria and anarthria: Secondary | ICD-10-CM | POA: Diagnosis present

## 2014-01-18 DIAGNOSIS — G459 Transient cerebral ischemic attack, unspecified: Secondary | ICD-10-CM

## 2014-01-18 DIAGNOSIS — G936 Cerebral edema: Secondary | ICD-10-CM | POA: Diagnosis not present

## 2014-01-18 DIAGNOSIS — K219 Gastro-esophageal reflux disease without esophagitis: Secondary | ICD-10-CM | POA: Diagnosis present

## 2014-01-18 DIAGNOSIS — I6529 Occlusion and stenosis of unspecified carotid artery: Secondary | ICD-10-CM

## 2014-01-18 DIAGNOSIS — H409 Unspecified glaucoma: Secondary | ICD-10-CM | POA: Diagnosis present

## 2014-01-18 DIAGNOSIS — E870 Hyperosmolality and hypernatremia: Secondary | ICD-10-CM | POA: Diagnosis not present

## 2014-01-18 DIAGNOSIS — E876 Hypokalemia: Secondary | ICD-10-CM | POA: Diagnosis not present

## 2014-01-18 DIAGNOSIS — G9340 Encephalopathy, unspecified: Secondary | ICD-10-CM | POA: Diagnosis not present

## 2014-01-18 DIAGNOSIS — F329 Major depressive disorder, single episode, unspecified: Secondary | ICD-10-CM | POA: Diagnosis present

## 2014-01-18 DIAGNOSIS — M109 Gout, unspecified: Secondary | ICD-10-CM | POA: Diagnosis present

## 2014-01-18 DIAGNOSIS — K7689 Other specified diseases of liver: Secondary | ICD-10-CM | POA: Diagnosis present

## 2014-01-18 DIAGNOSIS — R29898 Other symptoms and signs involving the musculoskeletal system: Secondary | ICD-10-CM | POA: Diagnosis present

## 2014-01-18 DIAGNOSIS — I779 Disorder of arteries and arterioles, unspecified: Secondary | ICD-10-CM

## 2014-01-18 DIAGNOSIS — I5181 Takotsubo syndrome: Secondary | ICD-10-CM | POA: Diagnosis present

## 2014-01-18 DIAGNOSIS — I10 Essential (primary) hypertension: Secondary | ICD-10-CM | POA: Diagnosis present

## 2014-01-18 DIAGNOSIS — R9430 Abnormal result of cardiovascular function study, unspecified: Secondary | ICD-10-CM | POA: Diagnosis present

## 2014-01-18 DIAGNOSIS — IMO0002 Reserved for concepts with insufficient information to code with codable children: Secondary | ICD-10-CM | POA: Diagnosis present

## 2014-01-18 DIAGNOSIS — K449 Diaphragmatic hernia without obstruction or gangrene: Secondary | ICD-10-CM | POA: Diagnosis present

## 2014-01-18 DIAGNOSIS — R209 Unspecified disturbances of skin sensation: Secondary | ICD-10-CM

## 2014-01-18 DIAGNOSIS — Z800 Family history of malignant neoplasm of digestive organs: Secondary | ICD-10-CM

## 2014-01-18 DIAGNOSIS — Z8041 Family history of malignant neoplasm of ovary: Secondary | ICD-10-CM

## 2014-01-18 DIAGNOSIS — F3289 Other specified depressive episodes: Secondary | ICD-10-CM | POA: Diagnosis present

## 2014-01-18 DIAGNOSIS — Z515 Encounter for palliative care: Secondary | ICD-10-CM

## 2014-01-18 DIAGNOSIS — M3320 Polymyositis, organ involvement unspecified: Secondary | ICD-10-CM | POA: Diagnosis present

## 2014-01-18 DIAGNOSIS — H518 Other specified disorders of binocular movement: Secondary | ICD-10-CM | POA: Diagnosis present

## 2014-01-18 DIAGNOSIS — R4182 Altered mental status, unspecified: Secondary | ICD-10-CM

## 2014-01-18 DIAGNOSIS — J810 Acute pulmonary edema: Secondary | ICD-10-CM | POA: Diagnosis not present

## 2014-01-18 DIAGNOSIS — R161 Splenomegaly, not elsewhere classified: Secondary | ICD-10-CM | POA: Diagnosis present

## 2014-01-18 DIAGNOSIS — I6350 Cerebral infarction due to unspecified occlusion or stenosis of unspecified cerebral artery: Principal | ICD-10-CM | POA: Diagnosis present

## 2014-01-18 DIAGNOSIS — E785 Hyperlipidemia, unspecified: Secondary | ICD-10-CM | POA: Diagnosis present

## 2014-01-18 DIAGNOSIS — I739 Peripheral vascular disease, unspecified: Principal | ICD-10-CM

## 2014-01-18 DIAGNOSIS — Z808 Family history of malignant neoplasm of other organs or systems: Secondary | ICD-10-CM

## 2014-01-18 DIAGNOSIS — M1990 Unspecified osteoarthritis, unspecified site: Secondary | ICD-10-CM | POA: Diagnosis present

## 2014-01-18 DIAGNOSIS — Z833 Family history of diabetes mellitus: Secondary | ICD-10-CM

## 2014-01-18 DIAGNOSIS — R5381 Other malaise: Secondary | ICD-10-CM | POA: Diagnosis present

## 2014-01-18 DIAGNOSIS — I2510 Atherosclerotic heart disease of native coronary artery without angina pectoris: Secondary | ICD-10-CM | POA: Diagnosis present

## 2014-01-18 DIAGNOSIS — K5520 Angiodysplasia of colon without hemorrhage: Secondary | ICD-10-CM | POA: Diagnosis present

## 2014-01-18 DIAGNOSIS — R031 Nonspecific low blood-pressure reading: Secondary | ICD-10-CM | POA: Diagnosis not present

## 2014-01-18 DIAGNOSIS — D62 Acute posthemorrhagic anemia: Secondary | ICD-10-CM | POA: Diagnosis not present

## 2014-01-18 DIAGNOSIS — Z66 Do not resuscitate: Secondary | ICD-10-CM | POA: Diagnosis not present

## 2014-01-18 DIAGNOSIS — Z8249 Family history of ischemic heart disease and other diseases of the circulatory system: Secondary | ICD-10-CM

## 2014-01-18 DIAGNOSIS — H538 Other visual disturbances: Secondary | ICD-10-CM | POA: Diagnosis present

## 2014-01-18 DIAGNOSIS — J4489 Other specified chronic obstructive pulmonary disease: Secondary | ICD-10-CM | POA: Diagnosis present

## 2014-01-18 DIAGNOSIS — Z86711 Personal history of pulmonary embolism: Secondary | ICD-10-CM

## 2014-01-18 DIAGNOSIS — Z8051 Family history of malignant neoplasm of kidney: Secondary | ICD-10-CM

## 2014-01-18 DIAGNOSIS — I498 Other specified cardiac arrhythmias: Secondary | ICD-10-CM | POA: Diagnosis not present

## 2014-01-18 DIAGNOSIS — R4701 Aphasia: Secondary | ICD-10-CM | POA: Diagnosis present

## 2014-01-18 DIAGNOSIS — R5383 Other fatigue: Secondary | ICD-10-CM

## 2014-01-18 DIAGNOSIS — R1310 Dysphagia, unspecified: Secondary | ICD-10-CM | POA: Diagnosis present

## 2014-01-18 LAB — CBC
HEMATOCRIT: 37.6 % (ref 36.0–46.0)
HEMOGLOBIN: 12.4 g/dL (ref 12.0–15.0)
MCH: 28.7 pg (ref 26.0–34.0)
MCHC: 33 g/dL (ref 30.0–36.0)
MCV: 87 fL (ref 78.0–100.0)
Platelets: 221 10*3/uL (ref 150–400)
RBC: 4.32 MIL/uL (ref 3.87–5.11)
RDW: 15.2 % (ref 11.5–15.5)
WBC: 7.2 10*3/uL (ref 4.0–10.5)

## 2014-01-18 LAB — PROTIME-INR
INR: 1.02 (ref 0.00–1.49)
Prothrombin Time: 13.2 seconds (ref 11.6–15.2)

## 2014-01-18 LAB — I-STAT CHEM 8, ED
BUN: 12 mg/dL (ref 6–23)
CALCIUM ION: 1.07 mmol/L — AB (ref 1.13–1.30)
CHLORIDE: 101 meq/L (ref 96–112)
Creatinine, Ser: 1 mg/dL (ref 0.50–1.10)
Glucose, Bld: 92 mg/dL (ref 70–99)
HEMATOCRIT: 37 % (ref 36.0–46.0)
HEMOGLOBIN: 12.6 g/dL (ref 12.0–15.0)
Potassium: 3.9 mEq/L (ref 3.7–5.3)
Sodium: 142 mEq/L (ref 137–147)
TCO2: 25 mmol/L (ref 0–100)

## 2014-01-18 LAB — COMPREHENSIVE METABOLIC PANEL
ALT: 13 U/L (ref 0–35)
AST: 16 U/L (ref 0–37)
Albumin: 3.5 g/dL (ref 3.5–5.2)
Alkaline Phosphatase: 91 U/L (ref 39–117)
BILIRUBIN TOTAL: 0.4 mg/dL (ref 0.3–1.2)
BUN: 14 mg/dL (ref 6–23)
CHLORIDE: 105 meq/L (ref 96–112)
CO2: 26 mEq/L (ref 19–32)
CREATININE: 0.89 mg/dL (ref 0.50–1.10)
Calcium: 9.1 mg/dL (ref 8.4–10.5)
GFR calc Af Amer: 74 mL/min — ABNORMAL LOW (ref >90)
GFR calc non Af Amer: 64 mL/min — ABNORMAL LOW (ref >90)
Glucose, Bld: 94 mg/dL (ref 70–99)
Potassium: 4.2 mEq/L (ref 3.7–5.3)
Sodium: 144 mEq/L (ref 137–147)
Total Protein: 6.4 g/dL (ref 6.0–8.3)

## 2014-01-18 LAB — URINALYSIS, ROUTINE W REFLEX MICROSCOPIC
BILIRUBIN URINE: NEGATIVE
GLUCOSE, UA: NEGATIVE mg/dL
Hgb urine dipstick: NEGATIVE
KETONES UR: NEGATIVE mg/dL
Nitrite: NEGATIVE
PH: 6 (ref 5.0–8.0)
Protein, ur: NEGATIVE mg/dL
Specific Gravity, Urine: 1.026 (ref 1.005–1.030)
Urobilinogen, UA: 0.2 mg/dL (ref 0.0–1.0)

## 2014-01-18 LAB — APTT: aPTT: 27 seconds (ref 24–37)

## 2014-01-18 LAB — URINE MICROSCOPIC-ADD ON

## 2014-01-18 LAB — DIFFERENTIAL
Basophils Absolute: 0.1 10*3/uL (ref 0.0–0.1)
Basophils Relative: 1 % (ref 0–1)
EOS ABS: 0.2 10*3/uL (ref 0.0–0.7)
Eosinophils Relative: 3 % (ref 0–5)
Lymphocytes Relative: 18 % (ref 12–46)
Lymphs Abs: 1.3 10*3/uL (ref 0.7–4.0)
MONO ABS: 0.6 10*3/uL (ref 0.1–1.0)
MONOS PCT: 8 % (ref 3–12)
Neutro Abs: 5 10*3/uL (ref 1.7–7.7)
Neutrophils Relative %: 70 % (ref 43–77)

## 2014-01-18 LAB — POCT I-STAT 3, ART BLOOD GAS (G3+)
ACID-BASE DEFICIT: 5 mmol/L — AB (ref 0.0–2.0)
BICARBONATE: 21.3 meq/L (ref 20.0–24.0)
O2 SAT: 100 %
Patient temperature: 97.9
TCO2: 23 mmol/L (ref 0–100)
pCO2 arterial: 42.6 mmHg (ref 35.0–45.0)
pH, Arterial: 7.305 — ABNORMAL LOW (ref 7.350–7.450)
pO2, Arterial: 411 mmHg — ABNORMAL HIGH (ref 80.0–100.0)

## 2014-01-18 LAB — PLATELET INHIBITION P2Y12: PLATELET FUNCTION P2Y12: 137 [PRU] — AB (ref 194–418)

## 2014-01-18 LAB — I-STAT TROPONIN, ED: TROPONIN I, POC: 0 ng/mL (ref 0.00–0.08)

## 2014-01-18 LAB — ABO/RH: ABO/RH(D): O POS

## 2014-01-18 LAB — MRSA PCR SCREENING: MRSA BY PCR: NEGATIVE

## 2014-01-18 LAB — CBG MONITORING, ED: Glucose-Capillary: 86 mg/dL (ref 70–99)

## 2014-01-18 LAB — PREPARE RBC (CROSSMATCH)

## 2014-01-18 SURGERY — RADIOLOGY WITH ANESTHESIA
Anesthesia: General

## 2014-01-18 MED ORDER — HEPARIN SODIUM (PORCINE) 1000 UNIT/ML IJ SOLN
INTRAMUSCULAR | Status: DC | PRN
  Administered 2014-01-18: 500 [IU] via INTRAVENOUS
  Administered 2014-01-18: 3000 [IU] via INTRAVENOUS
  Administered 2014-01-18 (×4): 500 [IU] via INTRAVENOUS

## 2014-01-18 MED ORDER — ROCURONIUM BROMIDE 100 MG/10ML IV SOLN
INTRAVENOUS | Status: DC | PRN
  Administered 2014-01-18: 40 mg via INTRAVENOUS
  Administered 2014-01-18: 50 mg via INTRAVENOUS
  Administered 2014-01-18: 10 mg via INTRAVENOUS

## 2014-01-18 MED ORDER — PHENYLEPHRINE HCL 10 MG/ML IJ SOLN
10.0000 mg | INTRAVENOUS | Status: DC | PRN
  Administered 2014-01-18: 10 ug/min via INTRAVENOUS

## 2014-01-18 MED ORDER — PHENYLEPHRINE HCL 10 MG/ML IJ SOLN
INTRAMUSCULAR | Status: DC | PRN
  Administered 2014-01-18 (×4): 40 ug via INTRAVENOUS

## 2014-01-18 MED ORDER — PANTOPRAZOLE SODIUM 40 MG IV SOLR
40.0000 mg | INTRAVENOUS | Status: DC
  Administered 2014-01-18 – 2014-01-19 (×2): 40 mg via INTRAVENOUS
  Filled 2014-01-18 (×3): qty 40

## 2014-01-18 MED ORDER — PROPOFOL INFUSION 10 MG/ML OPTIME
INTRAVENOUS | Status: DC | PRN
  Administered 2014-01-18: 25 ug/kg/min via INTRAVENOUS

## 2014-01-18 MED ORDER — PROPOFOL 10 MG/ML IV EMUL
0.0000 ug/kg/min | INTRAVENOUS | Status: DC
  Administered 2014-01-18: 25 ug/kg/min via INTRAVENOUS
  Administered 2014-01-19: 15 ug/kg/min via INTRAVENOUS
  Administered 2014-01-19: 10 ug/kg/min via INTRAVENOUS
  Administered 2014-01-19 (×2): 70 ug/kg/min via INTRAVENOUS
  Administered 2014-01-19: 30 ug/kg/min via INTRAVENOUS
  Administered 2014-01-20: 15 ug/kg/min via INTRAVENOUS
  Filled 2014-01-18 (×4): qty 100

## 2014-01-18 MED ORDER — EPHEDRINE SULFATE 50 MG/ML IJ SOLN
INTRAMUSCULAR | Status: DC | PRN
  Administered 2014-01-18: 5 mg via INTRAVENOUS

## 2014-01-18 MED ORDER — IOHEXOL 300 MG/ML  SOLN
150.0000 mL | Freq: Once | INTRAMUSCULAR | Status: AC | PRN
  Administered 2014-01-18: 200 mL via INTRAVENOUS

## 2014-01-18 MED ORDER — ALBUTEROL SULFATE (2.5 MG/3ML) 0.083% IN NEBU: 2.5000 mg | INHALATION_SOLUTION | RESPIRATORY_TRACT | Status: DC | PRN

## 2014-01-18 MED ORDER — SENNOSIDES-DOCUSATE SODIUM 8.6-50 MG PO TABS
1.0000 | ORAL_TABLET | Freq: Every evening | ORAL | Status: DC | PRN
  Filled 2014-01-18: qty 1

## 2014-01-18 MED ORDER — ACETAMINOPHEN 650 MG RE SUPP: 650.0000 mg | Freq: Four times a day (QID) | RECTAL | Status: DC | PRN

## 2014-01-18 MED ORDER — CLOPIDOGREL BISULFATE 300 MG PO TABS
300.0000 mg | ORAL_TABLET | Freq: Once | ORAL | Status: AC
  Administered 2014-01-18: 300 mg via ORAL

## 2014-01-18 MED ORDER — ALBUMIN HUMAN 5 % IV SOLN
INTRAVENOUS | Status: DC | PRN
  Administered 2014-01-18: 19:00:00 via INTRAVENOUS

## 2014-01-18 MED ORDER — ASPIRIN 325 MG PO TABS
325.0000 mg | ORAL_TABLET | Freq: Every day | ORAL | Status: DC
  Administered 2014-01-19 – 2014-01-21 (×3): 325 mg via ORAL
  Filled 2014-01-18 (×3): qty 1

## 2014-01-18 MED ORDER — IPRATROPIUM-ALBUTEROL 0.5-2.5 (3) MG/3ML IN SOLN
3.0000 mL | Freq: Four times a day (QID) | RESPIRATORY_TRACT | Status: DC
  Administered 2014-01-19 – 2014-01-21 (×9): 3 mL via RESPIRATORY_TRACT
  Filled 2014-01-18 (×10): qty 3

## 2014-01-18 MED ORDER — NICARDIPINE HCL IN NACL 20-0.86 MG/200ML-% IV SOLN
5.0000 mg/h | INTRAVENOUS | Status: DC
  Administered 2014-01-18: 5 mg/h via INTRAVENOUS

## 2014-01-18 MED ORDER — PROPOFOL 10 MG/ML IV BOLUS
INTRAVENOUS | Status: DC | PRN
  Administered 2014-01-18: 130 mg via INTRAVENOUS

## 2014-01-18 MED ORDER — ASPIRIN 300 MG RE SUPP
300.0000 mg | Freq: Every day | RECTAL | Status: DC
  Filled 2014-01-18 (×3): qty 1

## 2014-01-18 MED ORDER — RANITIDINE HCL 150 MG/10ML PO SYRP: 300.0000 mg | ORAL_SOLUTION | Freq: Every day | ORAL | Status: DC

## 2014-01-18 MED ORDER — ASPIRIN 325 MG PO TABS: 325.0000 mg | ORAL_TABLET | Freq: Every day | ORAL | Status: DC

## 2014-01-18 MED ORDER — EPTIFIBATIDE 2 MG/ML IV SOLN
INTRAVENOUS | Status: AC
  Administered 2014-01-18: 2 mg
  Administered 2014-01-18: 1 mg
  Administered 2014-01-18 (×6): 2 mg
  Administered 2014-01-18: 0.8 mg
  Administered 2014-01-18: 2 mg
  Filled 2014-01-18: qty 10

## 2014-01-18 MED ORDER — ACETAMINOPHEN 500 MG PO TABS
1000.0000 mg | ORAL_TABLET | Freq: Four times a day (QID) | ORAL | Status: DC | PRN
  Administered 2014-01-19 – 2014-01-21 (×4): 1000 mg via ORAL
  Filled 2014-01-18 (×4): qty 2

## 2014-01-18 MED ORDER — MIDAZOLAM HCL 5 MG/5ML IJ SOLN
INTRAMUSCULAR | Status: DC | PRN
  Administered 2014-01-18: 2 mg via INTRAVENOUS

## 2014-01-18 MED ORDER — ONDANSETRON HCL 4 MG/2ML IJ SOLN: 4.0000 mg | Freq: Four times a day (QID) | INTRAMUSCULAR | Status: DC | PRN

## 2014-01-18 MED ORDER — FAMOTIDINE 40 MG/5ML PO SUSR
20.0000 mg | Freq: Every day | ORAL | Status: DC
  Administered 2014-01-19: 20 mg via ORAL
  Filled 2014-01-18 (×3): qty 2.5

## 2014-01-18 MED ORDER — CLOPIDOGREL BISULFATE 75 MG PO TABS
ORAL_TABLET | ORAL | Status: AC
  Administered 2014-01-18: 300 mg via ORAL
  Filled 2014-01-18: qty 4

## 2014-01-18 MED ORDER — FENTANYL CITRATE 0.05 MG/ML IJ SOLN
INTRAMUSCULAR | Status: DC | PRN
  Administered 2014-01-18: 50 ug via INTRAVENOUS
  Administered 2014-01-18: 100 ug via INTRAVENOUS

## 2014-01-18 MED ORDER — NITROGLYCERIN 1 MG/10 ML FOR IR/CATH LAB
INTRA_ARTERIAL | Status: AC
  Administered 2014-01-18 (×2): 25 ug
  Filled 2014-01-18: qty 10

## 2014-01-18 MED ORDER — SUCCINYLCHOLINE CHLORIDE 20 MG/ML IJ SOLN
INTRAMUSCULAR | Status: DC | PRN
  Administered 2014-01-18: 100 mg via INTRAVENOUS

## 2014-01-18 MED ORDER — GLYCOPYRROLATE 0.2 MG/ML IJ SOLN
INTRAMUSCULAR | Status: DC | PRN
  Administered 2014-01-18 (×2): 0.2 mg via INTRAVENOUS

## 2014-01-18 MED ORDER — SODIUM CHLORIDE 0.9 % IV SOLN
INTRAVENOUS | Status: DC
  Administered 2014-01-18 – 2014-01-19 (×2): via INTRAVENOUS

## 2014-01-18 MED ORDER — CLOPIDOGREL BISULFATE 75 MG PO TABS
75.0000 mg | ORAL_TABLET | Freq: Every day | ORAL | Status: DC
  Administered 2014-01-19 – 2014-01-21 (×3): 75 mg via ORAL
  Filled 2014-01-18 (×5): qty 1

## 2014-01-18 MED ORDER — SODIUM CHLORIDE 0.9 % IV SOLN
INTRAVENOUS | Status: AC | PRN
  Administered 2014-01-18: 75 mL/h via INTRAVENOUS

## 2014-01-18 MED ORDER — SODIUM CHLORIDE 0.9 % IV SOLN
INTRAVENOUS | Status: DC | PRN
  Administered 2014-01-18 (×2): via INTRAVENOUS

## 2014-01-18 MED ORDER — ASPIRIN 325 MG PO TABS
ORAL_TABLET | ORAL | Status: AC
  Administered 2014-01-18: 650 mg via ORAL
  Filled 2014-01-18: qty 2

## 2014-01-18 MED ORDER — FENTANYL CITRATE 0.05 MG/ML IJ SOLN
50.0000 ug | INTRAMUSCULAR | Status: DC | PRN
  Administered 2014-01-18 – 2014-01-21 (×12): 50 ug via INTRAVENOUS
  Filled 2014-01-18 (×13): qty 2

## 2014-01-18 MED ORDER — LABETALOL HCL 5 MG/ML IV SOLN
INTRAVENOUS | Status: DC | PRN
  Administered 2014-01-18: 5 mg via INTRAVENOUS

## 2014-01-18 MED ORDER — ASPIRIN EC 325 MG PO TBEC
DELAYED_RELEASE_TABLET | ORAL | Status: AC
  Filled 2014-01-18: qty 2

## 2014-01-18 MED ORDER — EPTIFIBATIDE 2 MG/ML IV SOLN: 2.0000 mg | INTRAVENOUS | Status: DC

## 2014-01-18 MED ORDER — ASPIRIN 325 MG PO TABS
650.0000 mg | ORAL_TABLET | Freq: Once | ORAL | Status: AC
  Administered 2014-01-18: 650 mg via ORAL

## 2014-01-18 MED ORDER — EPTIFIBATIDE 2 MG/ML IV SOLN
INTRAVENOUS | Status: AC
  Filled 2014-01-18: qty 10

## 2014-01-18 NOTE — ED Notes (Signed)
Code stroke called at 1248, went to CT Scanner 3, returned to room E37, patient hooked to cardiac monitor, blood pressure monitor, HR and O2.  Patient is still nonverbal, deep breathing and moaning.  She is right arm flaccid,inattention to right side and will not follow commands or.  VS stable.

## 2014-01-18 NOTE — Sedation Documentation (Addendum)
ARrived to IR suite, anesthesia present.  Opens eyes to names, non verbal, right sided weakness.  Dr Estanislado Pandy speaking with family.

## 2014-01-18 NOTE — Consult Note (Signed)
PULMONARY / CRITICAL CARE MEDICINE  Name: [name] MRN: [id number] DOB: 1940/12/16    ADMISSION DATE:  02/09/2014 CONSULTATION DATE:    Loistine Chance MD :  Leonie Man PRIMARY SERVICE:  PCCM  CHIEF COMPLAINT:  CVA  BRIEF PATIENT DESCRIPTION: 73 y.o. F with hx of significant carotid artery stenosis, went to office for routine carotid dopplers then became flaccid on right with left facial gaze.  Sent to ED for further eval, found to have acute left MCA.  Taken to IR for angioplasty, returned to ICU on vent.  PCCM was consulted.  SIGNIFICANT EVENTS / STUDIES:  6/8 - outpatient appt for carotid dopplers, developed CVA symptoms, taken to ED and found to have acute left MCA stroke. 6/8 Head CT >>> impending large left MCA infarct >>> developing infarct in left MCA, no ME / MLS. 6/8 IR procedure >>> s/p bilateral common carotid arteriogram, infusion of integrelin, minimal recanalization of occluded left MCA and stent assisted angioplasty of symptomatic left ICA proximal stenosis. 6/8 echo >>> 6/9 MRI / MRA >>>  LINES / TUBES: OETT 6/8 >>> OGT 6/8 >>> Foley 6/8 >>> Left radial A-line 6/8 >>>  CULTURES: None  ANTIBIOTICS: None    HISTORY OF PRESENT ILLNESS:  Pt is encephalopathic; therefore, this HPI is obtained from chart review. [name] is a 73 y.o. F with multiple medical co-morbidities as outlined below including hx of b/l carotid artery stenosis, went to cards office 6/8 for routine repeat carotid dopplers. Per chart review, pt had been complaining of right sided numbness and tingling since 6/6.  At beginning of doppler study, technician had noted that pt began to have some dysarthria and increased right sided weakness.  She was subsequently sent to the ED for further workup.  In ED, she was initially speaking fluently, but then suddenly became flaccid on right side and stopped speaking.  STAT Head CT was obtained which revealed dense MCA sign on left.  IR was consulted and pt was  taken to IR suite for procedure listed above.  She then returned to the ICU on the vent and PCCM was consulted.  PAST MEDICAL HISTORY :  Past Medical History  Diagnosis Date  . Pulmonary embolism     a. 07/2012 CTA chest negative for PE.  Marland Kitchen Rheumatoid arthritis(714.0)   . Hypertension   . Incontinence of feces   . Unspecified disorder of bladder   . Rectal prolapse   . Hiatal hernia   . IBS (irritable bowel syndrome)   . GERD (gastroesophageal reflux disease)   . Pain in limb   . Lumbago     hospital admission with back pain in April, 2011 repair of a disc herniation L4-L5   . Osteoarthrosis, unspecified whether generalized or localized, unspecified site   . COPD (chronic obstructive pulmonary disease)   . Tobacco use disorder   . CAD (coronary artery disease) Non-obstructive    a. Nuclear, May, 2010, no ischemia;  b. cath 2012, nonobstructive CAD ( 40% circumflex, 40% LAD) EF normal;  c. 07/2012 NSTEMI/Cath: nonobs dzs, EF 40% w/ periapical AK.;  d.  Carlton Adam Myoview (11/14): EF 61%, no ischemia, normal study.  . Carotid artery disease     a. Doppler, July, 2010, 40-59% bilateral, b. Dopplers 6/13:  40-59% bilat (f/u  01/2014)  . Adenomatous polyp 02/2008  . AVM (arteriovenous malformation)     Small colonic  . Shortness of breath     Cardiac evaluation, Dr.Katz, pulmonary evaluation, 2012, Dr. Lamonte Sakai.  Marland Kitchen  UTI (lower urinary tract infection)     Possible UTI by history June 14, 2011,   . Gout   . Shoulder pain, right   . Spinal stenosis   . Hyperlipidemia   . Glaucoma   . Splenomegaly   . Fatty infiltration of liver   . Cystitis   . Takotsubo cardiomyopathy Presumed    a. 07/2012 NSTEMI/Cath (nonobs dzs)/Echo:EF 45-50%, mid-distal antsept AK w/ Gr 1 DD.;  b.  Echo 09/2012: EF 55%, mild LAE  . Dysphagia     a. s/p prior esophageal dilatation. //  Dysarthria and dysphasia hospitalization December, 2013, neurology workup ongoing  . CHF (congestive heart failure)   . Pneumonia      hx of; has not had since taken pneumonia vaccine  . Elevated CPK     Been evaluated with muscle biopsy, February, 2014  . Speech abnormality     January, 2014, question small CVA  . Depression   . Inclusion body myositis    Past Surgical History  Procedure Laterality Date  . Laminectomy      lumbar, HX of L4-5 x 2  . Cervical disc compression      c5-c6 Hx of  . Appendectomy    . Cholecystectomy    . Bladder surgery      x 2  . Vaginal hysterectomy    . Rectocele repair    . Interstim medronic stimulator  04/2010  . Back surgery      x 2  . Eye surgery      bilateral cataract removal  . Cardiac catheterization  07/28/12    no PCI  . Colonoscopy    . Muscle biopsy  09/17/2012    Procedure: GASTROCNEMIUS MUSCLE BIOPSY;  Surgeon: Winfield Cunas, MD;  Location: [facility];  Service: Neurosurgery;  Laterality: Right;  RIGHT DELTOID muscle biopsy   Prior to Admission medications   Medication Sig Start Date End Date Taking? Authorizing Provider  albuterol (ACCUNEB) 1.25 MG/3ML nebulizer solution Take 3 mLs (1.25 mg total) by nebulization every 6 (six) hours as needed for wheezing. 09/15/13   Collene Gobble, MD  albuterol (PROVENTIL) (2.5 MG/3ML) 0.083% nebulizer solution Take 3 mLs (2.5 mg total) by nebulization every 6 (six) hours as needed for wheezing. 09/15/13   Collene Gobble, MD  Ascorbic Acid (VITAMIN C) 500 MG tablet Take 1,000 mg by mouth daily.     Historical Provider, MD  aspirin 81 MG tablet Take 81 mg by mouth daily.      Historical Provider, MD  atorvastatin (LIPITOR) 80 MG tablet Take 1 tablet (80 mg total) by mouth daily at 6 PM. 07/30/12   Rogelia Mire, NP  Cholecalciferol 2000 UNITS TABS Take 4,000 Units by mouth 2 (two) times daily.    Historical Provider, MD  furosemide (LASIX) 40 MG tablet Take 40 mg by mouth daily.    Historical Provider, MD  isosorbide mononitrate (IMDUR) 30 MG 24 hr tablet Take 0.5 tablets (15 mg total) by mouth daily. 06/15/13   Liliane Shi, PA-C  leflunomide (ARAVA) 20 MG tablet Take 20 mg by mouth daily.     Historical Provider, MD  losartan (COZAAR) 50 MG tablet TAKE 1 TABLET (50 MG TOTAL) BY MOUTH DAILY. 09/03/13   Carlena Bjornstad, MD  metoprolol succinate (TOPROL-XL) 25 MG 24 hr tablet TAKE 1 TABLET (25 MG TOTAL) BY MOUTH DAILY. TAKE WITH OR IMMEDIATELY FOLLOWING A MEAL. 09/04/13   Carlena Bjornstad, MD  NITROSTAT 0.4 MG SL tablet PLACE 1 TABLET (0.4 MG TOTAL) UNDER THE TONGUE EVERY 5 (FIVE) MINUTES AS NEEDED FOR CHEST PAIN. 04/25/13   Carlena Bjornstad, MD  pantoprazole (PROTONIX) 40 MG tablet Take 80 mg by mouth daily.    Historical Provider, MD  potassium chloride (KLOR-CON 10) 10 MEQ tablet TAKE 2 TABLETS (20 MEQ TOTAL) BY MOUTH DAILY. 03/12/13   Carlena Bjornstad, MD  PROAIR HFA 108 (90 BASE) MCG/ACT inhaler Inhale 2 puffs into the lungs every 6 (six) hours as needed. For shortness breath 08/09/12   Historical Provider, MD  ranitidine (ZANTAC) 300 MG tablet Take 300 mg by mouth at bedtime.    Historical Provider, MD  trimethoprim (TRIMPEX) 100 MG tablet Take 100 mg by mouth daily.    Historical Provider, MD   Allergies  Allergen Reactions  . Methotrexate Derivatives Shortness Of Breath and Other (See Comments)    Caused build up of scarring in  Lungs.  Causes problems with elevated liver enzymes.  . Codeine Itching    Causes minor itching, patient can take with benadryl  . Imuran [Azathioprine] Nausea And Vomiting  . Levofloxacin Swelling  . Lunesta [Eszopiclone] Nausea And Vomiting  . Nitrofurantoin Itching    *MACROBID*  Tongue swelling    FAMILY HISTORY:  Family History  Problem Relation Age of Onset  . Arthritis Brother   . Colon cancer Paternal Grandmother   . Lung cancer Father     x 3 uncles and a aunt  . Brain cancer Father   . Heart disease Mother   . Kidney disease Mother     chronic UTI  . Diabetes Mother   . Diabetes Sister   . Ovarian cancer Maternal Aunt   . Heart disease Maternal Uncle   . Kidney  cancer Maternal Uncle   . Heart disease Paternal Aunt   . Stomach cancer Paternal Aunt   . Pancreatic cancer Maternal Grandmother   . Esophageal cancer Neg Hx   . Rectal cancer Neg Hx     SOCIAL HISTORY:  reports that she quit smoking about 6 years ago. She has never used smokeless tobacco. She reports that she does not drink alcohol or use illicit drugs.   REVIEW OF SYSTEMS:  Unable to complete as pt is encephalopathic.   SUBJECTIVE:  Mildly hypertensive, otherwise vitals stable.  VITAL SIGNS: Temp:  [98.2 F (36.8 C)] 98.2 F (36.8 C) (06/08 1306) Pulse Rate:  [82] 82 (06/08 1306) Resp:  [18] 18 (06/08 1306) BP: (155)/(76) 155/76 mmHg (06/08 1306) SpO2:  [100 %] 100 % (06/08 1306) FiO2 (%):  [100 %] 100 % (06/08 2015)  HEMODYNAMICS:    VENTILATOR SETTINGS: Vent Mode:  [-] PRVC FiO2 (%):  [100 %] 100 % Set Rate:  [15 bmp] 15 bmp Vt Set:  [450 mL] 450 mL PEEP:  [5 cmH20] 5 cmH20 Plateau Pressure:  [17 cmH20] 17 cmH20  INTAKE / OUTPUT: Intake/Output     06/08 0701 - 06/09 0700   I.V. 1900   IV Piggyback 250   Total Intake 2150   Urine 355   Blood 150   Total Output 505   Net +1645         PHYSICAL EXAMINATION: General: Sedate in bed on vent, in NAD. Neuro: Sedated. R hemiparesis HEENT: [place]/AT. PERRL, sclerae anicteric. Cardiovascular: RRR, no M/R/G.  Lungs: Respirations even and unlabored.  CTA bilaterally, No W/R/R.  On full vent support. Abdomen: BS x 4, soft, NT/ND.  Musculoskeletal:  No gross deformities, no edema.  Skin: Intact, warm, no rashes.    LABS: CBC  Recent Labs Lab 01/30/2014 1259 01/29/2014 1310  WBC 7.2  --   HGB 12.4 12.6  HCT 37.6 37.0  PLT 221  --    Coag's  Recent Labs Lab 01/29/2014 1259  APTT 27  INR 1.02   BMET  Recent Labs Lab 01/16/2014 1259 02/01/2014 1310  NA 144 142  K 4.2 3.9  CL 105 101  CO2 26  --   BUN 14 12  CREATININE 0.89 1.00  GLUCOSE 94 92   Electrolytes  Recent Labs Lab 02/01/2014 1259   CALCIUM 9.1   Sepsis Markers No results found for this basename: LATICACIDVEN, PROCALCITON, O2SATVEN,  in the last 168 hours ABG No results found for this basename: PHART, PCO2ART, PO2ART,  in the last 168 hours Liver Enzymes  Recent Labs Lab 02/08/2014 1259  AST 16  ALT 13  ALKPHOS 91  BILITOT 0.4  ALBUMIN 3.5   Cardiac Enzymes No results found for this basename: TROPONINI, PROBNP,  in the last 168 hours Glucose  Recent Labs Lab 01/15/2014 1303  GLUCAP 86    IMAGING: Ct Head Wo Contrast  01/19/2014   CLINICAL DATA:  Followup a thrombectomy  EXAM: CT HEAD WITHOUT CONTRAST  TECHNIQUE: Contiguous axial images were obtained from the base of the skull through the vertex without intravenous contrast.  COMPARISON:  Earlier same day  FINDINGS: The patient has recently had thrombectomy in the left middle cerebral artery. The brainstem and cerebellum continue have a normal appearance. No abnormality seen in the right hemisphere. On the left, there is loss of gray-white differentiation in the insular region in the frontoparietal brain consistent with developing infarction in the left middle cerebral artery territory. There is contrast staining in the insula and in the left caudate. There is probably some contrast trapped within residual thrombus in the middle cerebral artery. No sign of subarachnoid hemorrhage. No hydrocephalus or extra-axial collection.  IMPRESSION: Evidence of developing infarction in the left middle cerebral artery territory. No mass effect or shift at this time. Contrast staining in the insular brain and left caudate. Probable contrast staining within what likely represents residual thrombus in the left middle cerebral artery.   Electronically Signed   By: Nelson Chimes M.D.   On: 01/21/2014 20:44   Ct Head (brain) Wo Contrast  01/11/2014   CLINICAL DATA:  Unresponsive.  Right-sided is flaccid.  EXAM: CT HEAD WITHOUT CONTRAST  TECHNIQUE: Contiguous axial images were obtained  from the base of the skull through the vertex without intravenous contrast.  COMPARISON:  None.  FINDINGS: Interval development of dense left middle cerebral artery/ left carotid terminus consistent with thrombus which may indicate impending large left middle cerebral artery distribution infarct.  No intracranial hemorrhage.  No intracranial mass lesion noted on this unenhanced exam.  No hydrocephalus.  IMPRESSION: Interval development of dense left middle cerebral artery/ left carotid terminus consistent with thrombus which may indicate impending large left middle cerebral artery distribution infarct.  No intracranial hemorrhage.  These results were called by telephone at the time of interpretation on 02/01/2014 at 1:00 PM to Dr. Doy Mince who verbally acknowledged these results.   Electronically Signed   By: Chauncey Cruel M.D.   On: 01/27/2014 13:07   CXR: NACPD   ASSESSMENT / PLAN:  PULMONARY A:   VDRF Hx ILD Hx Tobacco use disorder P:   - Full mechanical support, wean as able. -  Goal SpO2>92. - VAP bundle. - DuoNebs / Albuterol. - SBT in AM. - ABG and CXR in AM.  CARDIOVASCULAR A:  Hx HTN Hx CAD Hx Takotsubo cardiomyopathy Hx carotid artery disease Transient hypotension P:  - Hold home Toprol, Losartan, Furosemide, Imdur. - Cardene for goal BP 120 - 130 per IR. - Plavix. - Neo PRN. - F/u on echo. - Consider restarting home aspirin in AM.  RENAL A:   No acute issues. P:   - Ns @ 75. - BMP in AM.  GASTROINTESTINAL A:   Hx GERD Hx IBS P:   - NPO as intubated. - TF if remains intubated > 24 hours. - Continue Pantoprazole / Zantac.  HEMATOLOGIC A:   Anemia post procedure (Hgb 7.5) P:  - Transfusing 1 unit PRBC now. - Hold 2nd unit PRBC until next H/H. - VTE Proph:  SCD's. - CBC in AM.  INFECTIOUS A:   No acute issues. P:   - Monitor for fever / leukocytosis.  ENDOCRINE  A:   No acute issues. P:   - Monitor glucose on BMP.  NEUROLOGIC A:   Acute CVA  - s/p bilateral common carotid arteriogram, infusion of integrelin, minimal recanalization of occluded left MCA and stent assisted angioplasty of symptomatic left ICA proximal stenosis. Acute encephalopathy P:   - Sedation:  Propofol. - Goal RASS 0 to -1. - Daily WUA. - PT/OT.  MUSCULOSKELETAL A: Rheumatoid Arthritis Hx myositis P: - Hold home leflunomide for now.   Montey Hora, [facility] Pgr: [phone]  or [phone]   I have personally obtained history, examined patient, evaluated and interpreted laboratory and imaging results, reviewed medical records, formulated assessment / plan and placed orders.  CRITICAL CARE:  The patient is critically ill with multiple organ systems failure and requires high complexity decision making for assessment and support, frequent evaluation and titration of therapies, application of advanced monitoring technologies and extensive interpretation of multiple databases. Critical Care Time devoted to patient care services described in this note is 35 minutes.   Merton Border, MD ; [facility] [phone].  After 5:30 PM or weekends, call [phone]

## 2014-01-18 NOTE — ED Notes (Addendum)
This weakness patient started to experience numbness and weakness to right arm and face and slurred speech on Saturday , patient drove herself to [place] this a.m., EMS called for patient to come to ER for further evaluation.  Patient alert and oriented on arrival from EMS.  Patient status change. Please see new notes.

## 2014-01-18 NOTE — H&P (Signed)
Referring Physician: Zenia Resides    Chief Complaint: Poor responsiveness, right sided weakness  HPI: [name] is an 73 y.o. female who reports that on Friday night she began to notice some right upper extremity weakness and numbness.  The patient had fallen earlier and had weakness since that time.  That is not particularly unusual for her since she has weakness at times related to her other medical problems.  She went to her PCP today and a carotid doppler was performed.  She had significant carotid disease noted and was sent to the ED.  While being evaluated in the ED the patient became poorly responsive with left eye deviation and right upper and lower extremity weakness.  Code stroke was called at that time.    Date last known well: Date: 01/15/2014 Time last known well: Unable to determine tPA Given: No: Outside time window  Past Medical History  Diagnosis Date  . Pulmonary embolism     a. 07/2012 CTA chest negative for PE.  Marland Kitchen Rheumatoid arthritis(714.0)   . Hypertension   . Incontinence of feces   . Unspecified disorder of bladder   . Rectal prolapse   . Hiatal hernia   . IBS (irritable bowel syndrome)   . GERD (gastroesophageal reflux disease)   . Pain in limb   . Lumbago     hospital admission with back pain in April, 2011 repair of a disc herniation L4-L5   . Osteoarthrosis, unspecified whether generalized or localized, unspecified site   . COPD (chronic obstructive pulmonary disease)   . Tobacco use disorder   . CAD (coronary artery disease) Non-obstructive    a. Nuclear, May, 2010, no ischemia;  b. cath 2012, nonobstructive CAD ( 40% circumflex, 40% LAD) EF normal;  c. 07/2012 NSTEMI/Cath: nonobs dzs, EF 40% w/ periapical AK.;  d.  Carlton Adam Myoview (11/14): EF 61%, no ischemia, normal study.  . Carotid artery disease     a. Doppler, July, 2010, 40-59% bilateral, b. Dopplers 6/13:  40-59% bilat (f/u  01/2014)  . Adenomatous polyp 02/2008  . AVM (arteriovenous  malformation)     Small colonic  . Shortness of breath     Cardiac evaluation, Dr.Katz, pulmonary evaluation, 2012, Dr. Lamonte Sakai.  Marland Kitchen UTI (lower urinary tract infection)     Possible UTI by history June 14, 2011,   . Gout   . Shoulder pain, right   . Spinal stenosis   . Hyperlipidemia   . Glaucoma   . Splenomegaly   . Fatty infiltration of liver   . Cystitis   . Takotsubo cardiomyopathy Presumed    a. 07/2012 NSTEMI/Cath (nonobs dzs)/Echo:EF 45-50%, mid-distal antsept AK w/ Gr 1 DD.;  b.  Echo 09/2012: EF 55%, mild LAE  . Dysphagia     a. s/p prior esophageal dilatation. //  Dysarthria and dysphasia hospitalization December, 2013, neurology workup ongoing  . CHF (congestive heart failure)   . Pneumonia     hx of; has not had since taken pneumonia vaccine  . Elevated CPK     Been evaluated with muscle biopsy, February, 2014  . Speech abnormality     January, 2014, question small CVA  . Depression   . Inclusion body myositis     Past Surgical History  Procedure Laterality Date  . Laminectomy      lumbar, HX of L4-5 x 2  . Cervical disc compression      c5-c6 Hx of  . Appendectomy    . Cholecystectomy    .  Bladder surgery      x 2  . Vaginal hysterectomy    . Rectocele repair    . Interstim medronic stimulator  04/2010  . Back surgery      x 2  . Eye surgery      bilateral cataract removal  . Cardiac catheterization  07/28/12    no PCI  . Colonoscopy    . Muscle biopsy  09/17/2012    Procedure: GASTROCNEMIUS MUSCLE BIOPSY;  Surgeon: Winfield Cunas, MD;  Location: [facility];  Service: Neurosurgery;  Laterality: Right;  RIGHT DELTOID muscle biopsy    Family History  Problem Relation Age of Onset  . Arthritis Brother   . Colon cancer Paternal Grandmother   . Lung cancer Father     x 3 uncles and a aunt  . Brain cancer Father   . Heart disease Mother   . Kidney disease Mother     chronic UTI  . Diabetes Mother   . Diabetes Sister   . Ovarian cancer Maternal  Aunt   . Heart disease Maternal Uncle   . Kidney cancer Maternal Uncle   . Heart disease Paternal Aunt   . Stomach cancer Paternal Aunt   . Pancreatic cancer Maternal Grandmother   . Esophageal cancer Neg Hx   . Rectal cancer Neg Hx    Social History:  reports that she quit smoking about 6 years ago. She has never used smokeless tobacco. She reports that she does not drink alcohol or use illicit drugs.  Allergies:  Allergies  Allergen Reactions  . Methotrexate Derivatives Shortness Of Breath and Other (See Comments)    Caused build up of scarring in  Lungs.  Causes problems with elevated liver enzymes.  . Codeine Itching    Causes minor itching, patient can take with benadryl  . Imuran [Azathioprine] Nausea And Vomiting  . Levofloxacin Swelling  . Lunesta [Eszopiclone] Nausea And Vomiting  . Nitrofurantoin Itching    *MACROBID*  Tongue swelling    Medications: I have reviewed the patient's current medications. Prior to Admission:  Current outpatient prescriptions:albuterol (ACCUNEB) 1.25 MG/3ML nebulizer solution, Take 3 mLs (1.25 mg total) by nebulization every 6 (six) hours as needed for wheezing., Disp: 75 mL, Rfl: 12;  albuterol (PROVENTIL) (2.5 MG/3ML) 0.083% nebulizer solution, Take 3 mLs (2.5 mg total) by nebulization every 6 (six) hours as needed for wheezing., Disp: 360 mL, Rfl: 6 Ascorbic Acid (VITAMIN C) 500 MG tablet, Take 1,000 mg by mouth daily. , Disp: , Rfl: ;  aspirin 81 MG tablet, Take 81 mg by mouth daily.  , Disp: , Rfl: ;  atorvastatin (LIPITOR) 80 MG tablet, Take 1 tablet (80 mg total) by mouth daily at 6 PM., Disp: 30 tablet, Rfl: 6;  Cholecalciferol 2000 UNITS TABS, Take 4,000 Units by mouth 2 (two) times daily., Disp: , Rfl: ;  furosemide (LASIX) 40 MG tablet, Take 40 mg by mouth daily., Disp: , Rfl:  isosorbide mononitrate (IMDUR) 30 MG 24 hr tablet, Take 0.5 tablets (15 mg total) by mouth daily., Disp: 135 tablet, Rfl: 3;  leflunomide (ARAVA) 20 MG tablet, Take  20 mg by mouth daily. , Disp: , Rfl: ;  losartan (COZAAR) 50 MG tablet, TAKE 1 TABLET (50 MG TOTAL) BY MOUTH DAILY., Disp: 90 tablet, Rfl: 0 metoprolol succinate (TOPROL-XL) 25 MG 24 hr tablet, TAKE 1 TABLET (25 MG TOTAL) BY MOUTH DAILY. TAKE WITH OR IMMEDIATELY FOLLOWING A MEAL., Disp: 90 tablet, Rfl: 1;  NITROSTAT 0.4 MG  SL tablet, PLACE 1 TABLET (0.4 MG TOTAL) UNDER THE TONGUE EVERY 5 (FIVE) MINUTES AS NEEDED FOR CHEST PAIN., Disp: 25 tablet, Rfl: 1;  pantoprazole (PROTONIX) 40 MG tablet, Take 80 mg by mouth daily., Disp: , Rfl:  potassium chloride (KLOR-CON 10) 10 MEQ tablet, TAKE 2 TABLETS (20 MEQ TOTAL) BY MOUTH DAILY., Disp: 180 tablet, Rfl: 1;  PROAIR HFA 108 (90 BASE) MCG/ACT inhaler, Inhale 2 puffs into the lungs every 6 (six) hours as needed. For shortness breath, Disp: , Rfl: ;  ranitidine (ZANTAC) 300 MG tablet, Take 300 mg by mouth at bedtime., Disp: , Rfl: ;  trimethoprim (TRIMPEX) 100 MG tablet, Take 100 mg by mouth daily., Disp: , Rfl:  [DISCONTINUED] Etanercept (ENBREL [place]), Inject 1 mg into the skin. Once a week , Disp: , Rfl:   ROS: Unable to obtain  Physical Examination: Blood pressure 155/76, pulse 82, temperature 98.2 F (36.8 C), temperature source Oral, resp. rate 18, SpO2 100.00%.  Neurologic Examination: Mental Status: Lethargic.  Turns to name being called.  Will follow only some simple commands.  No speech, just groans. Cranial Nerves: II: Discs flat bilaterally.  Does not blink to bilateral confrontation.  Left pupil 10mm and sluggishly reactive.  Right pupil briskly reactive and 74mm III,IV, VI: ptosis not present, left eye deviation V,VII: right facial droop VIII: hearing normal bilaterally IX,X: gag reflex weak XI: unable to test XII: unable to test Motor: Will not maintain the right upper or lower extremity against gravity.  Maintains the left upper extremity against gravity.  Some tone noted in the left lower extremity Tone and bulk:normal tone throughout; no  atrophy noted Sensory: Responds to noxious stimuli throughout Deep Tendon Reflexes: 2+ and symmetric throughout Plantars: Right: upgoing   Left: upgoing Cerebellar: Unable to perform Gait: Unable to perform CV: pulses palpable throughout     Laboratory Studies:  Basic Metabolic Panel:  Recent Labs Lab 02/03/2014 1259 01/20/2014 1310  NA 144 142  K 4.2 3.9  CL 105 101  CO2 26  --   GLUCOSE 94 92  BUN 14 12  CREATININE 0.89 1.00  CALCIUM 9.1  --     Liver Function Tests:  Recent Labs Lab 01/21/2014 1259  AST 16  ALT 13  ALKPHOS 91  BILITOT 0.4  PROT 6.4  ALBUMIN 3.5   No results found for this basename: LIPASE, AMYLASE,  in the last 168 hours No results found for this basename: AMMONIA,  in the last 168 hours  CBC:  Recent Labs Lab 02/09/2014 1259 02/04/2014 1310  WBC 7.2  --   NEUTROABS 5.0  --   HGB 12.4 12.6  HCT 37.6 37.0  MCV 87.0  --   PLT 221  --     Cardiac Enzymes: No results found for this basename: CKTOTAL, CKMB, CKMBINDEX, TROPONINI,  in the last 168 hours  BNP: No components found with this basename: POCBNP,   CBG:  Recent Labs Lab 02/03/2014 1303  [place]    Microbiology: Results for orders placed during the hospital encounter of 09/04/13  CULTURE, BLOOD (ROUTINE X 2)     Status: None   Collection Time    09/05/13  8:20 AM      Result Value Ref Range Status   Specimen Description BLOOD RIGHT ANTECUBITAL   Final   Special Requests BOTTLES DRAWN AEROBIC AND ANAEROBIC 10CC   Final   Culture  Setup Time     Final   Value: 09/05/2013 14:43  Performed at [facility]     Final   Value: NO GROWTH 5 DAYS     Performed at [facility]   Report Status 09/11/2013 FINAL   Final  CULTURE, BLOOD (ROUTINE X 2)     Status: None   Collection Time    09/05/13  8:40 AM      Result Value Ref Range Status   Specimen Description BLOOD LEFT HAND   Final   Special Requests BOTTLES DRAWN AEROBIC AND ANAEROBIC 10CC    Final   Culture  Setup Time     Final   Value: 09/05/2013 14:43     Performed at [facility]   Culture     Final   Value: NO GROWTH 5 DAYS     Performed at [facility]   Report Status 09/11/2013 FINAL   Final    Coagulation Studies:  Recent Labs  01/24/2014 1259  LABPROT 13.2  INR 1.02    Urinalysis:   Recent Labs Lab 01/15/2014 1345  COLORURINE YELLOW  LABSPEC 1.026  PHURINE 6.0  GLUCOSEU NEGATIVE  HGBUR NEGATIVE  BILIRUBINUR NEGATIVE  KETONESUR NEGATIVE  PROTEINUR NEGATIVE  UROBILINOGEN 0.2  NITRITE NEGATIVE  LEUKOCYTESUR SMALL*    Lipid Panel:    Component Value Date/Time   CHOL 213* 07/27/2012 0932   TRIG 263* 07/27/2012 0932   HDL 37* 07/27/2012 0932   CHOLHDL 5.8 07/27/2012 0932   VLDL 53* 07/27/2012 0932   LDLCALC 123* 07/27/2012 0932    HgbA1C:  Lab Results  Component Value Date   HGBA1C 5.4 07/27/2012    Urine Drug Screen:   No results found for this basename: labopia,  cocainscrnur,  labbenz,  amphetmu,  thcu,  labbarb    Alcohol Level: No results found for this basename: ETH,  in the last 168 hours  Other results: EKG: normal sinus rhythm at 83 bpm.  Imaging: Ct Head (brain) Wo Contrast  01/31/2014   CLINICAL DATA:  Unresponsive.  Right-sided is flaccid.  EXAM: CT HEAD WITHOUT CONTRAST  TECHNIQUE: Contiguous axial images were obtained from the base of the skull through the vertex without intravenous contrast.  COMPARISON:  None.  FINDINGS: Interval development of dense left middle cerebral artery/ left carotid terminus consistent with thrombus which may indicate impending large left middle cerebral artery distribution infarct.  No intracranial hemorrhage.  No intracranial mass lesion noted on this unenhanced exam.  No hydrocephalus.  IMPRESSION: Interval development of dense left middle cerebral artery/ left carotid terminus consistent with thrombus which may indicate impending large left middle cerebral artery distribution  infarct.  No intracranial hemorrhage.  These results were called by telephone at the time of interpretation on 01/11/2014 at 1:00 PM to Dr. Doy Mince who verbally acknowledged these results.   Electronically Signed   By: Chauncey Cruel M.D.   On: 01/21/2014 13:07    Assessment: 72 y.o. female presenting with right upper extremity weakness that worsened in the ED to lethargy, right sided weakness and eye deviation.  Since there is a possibility that the patient's symptoms may started actually a few days ago, it was felt that the patient would not be a IV tPA candidate.  Patient clearly had an acute event that caused her worsening in the ED though.  Conversation had with IR and it was determined that the patient would have a catheter angiogram with further treatment to be decided at that time.  Head CT reviewed and shows a dense  left M1.    Stroke Risk Factors - hyperlipidemia and hypertension  Plan: 1. HgbA1c, fasting lipid panel 2. MRI, MRA  of the brain without contrast 3. PT consult, OT consult, Speech consult 4. Echocardiogram 5. Prophylactic therapy-Plavix, ASA 6. Risk factor modification 7. Telemetry monitoring 8. Frequent neuro checks 9. Admit to ICU  This patient is critically ill and at significant risk of neurological worsening, death and care requires constant monitoring of vital signs, hemodynamics,respiratory and cardiac monitoring, neurological assessment, discussion with family, other specialists and medical decision making of high complexity. I spent 120 minutes of neurocritical care time  in the care of  this patient.  Alexis Goodell, MD [facility] [phone] 01/14/2014  4:14 PM

## 2014-01-18 NOTE — Anesthesia Procedure Notes (Signed)
Procedure Name: Intubation Date/Time: 01/11/2014 2:12 PM Performed by: Ollen Bowl Pre-anesthesia Checklist: Patient identified, Emergency Drugs available, Suction available, Patient being monitored and Timeout performed Patient Re-evaluated:Patient Re-evaluated prior to inductionOxygen Delivery Method: Circle system utilized and Simple face mask Preoxygenation: Pre-oxygenation with 100% oxygen Intubation Type: IV induction, Cricoid Pressure applied and Rapid sequence Laryngoscope Size: Miller and 2 Grade View: Grade I Tube type: Oral Laser Tube: Cuffed inflated with minimal occlusive pressure - saline Tube size: 7.5 mm Number of attempts: 1 Airway Equipment and Method: Patient positioned with wedge pillow and Stylet Placement Confirmation: ETT inserted through vocal cords under direct vision,  positive ETCO2 and breath sounds checked- equal and bilateral Secured at: 21 cm Tube secured with: Tape Dental Injury: Teeth and Oropharynx as per pre-operative assessment

## 2014-01-18 NOTE — Sedation Documentation (Signed)
P2Y12 drawn and hand delivered to lab.

## 2014-01-18 NOTE — Anesthesia Preprocedure Evaluation (Addendum)
Anesthesia Evaluation  Patient identified by MRN, date of birth, ID band Patient awake    Reviewed: Allergy & Precautions, H&P , NPO status , Patient's Chart, lab work & pertinent test results  Airway Mallampati: II TM Distance: >3 FB Neck ROM: Full  Mouth opening: Limited Mouth Opening  Dental  (+) Edentulous Upper   Pulmonary shortness of breath, pneumonia -, COPDformer smoker,  breath sounds clear to auscultation        Cardiovascular hypertension, + CAD, + Peripheral Vascular Disease and +CHF Rhythm:Regular Rate:Normal     Neuro/Psych    GI/Hepatic hiatal hernia, GERD-  ,  Endo/Other    Renal/GU      Musculoskeletal  (+) Arthritis -,   Abdominal   Peds  Hematology   Anesthesia Other Findings   Reproductive/Obstetrics                          Anesthesia Physical Anesthesia Plan  ASA: IV and emergent  Anesthesia Plan: General   Post-op Pain Management:    Induction: Intravenous  Airway Management Planned: Oral ETT  Additional Equipment: Arterial line  Intra-op Plan:   Post-operative Plan: Post-operative intubation/ventilation  Informed Consent: I have reviewed the patients History and Physical, chart, labs and discussed the procedure including the risks, benefits and alternatives for the proposed anesthesia with the patient or authorized representative who has indicated his/her understanding and acceptance.   Dental advisory given and Only emergency history available  Plan Discussed with: CRNA and Anesthesiologist  Anesthesia Plan Comments:        Anesthesia Quick Evaluation

## 2014-01-18 NOTE — Sedation Documentation (Signed)
Recannalization

## 2014-01-18 NOTE — Transfer of Care (Signed)
Immediate Anesthesia Transfer of Care Note  Patient: [name]  Procedure(s) Performed: * No procedures listed *  Patient Location: PACU and NICU  Anesthesia Type:General  Level of Consciousness: sedated, unresponsive and Patient remains intubated per anesthesia plan  Airway & Oxygen Therapy: Patient remains intubated per anesthesia plan and Patient placed on Ventilator (see vital sign flow sheet for setting)  Post-op Assessment: Report given to PACU RN and Post -op Vital signs reviewed and stable  Post vital signs: Reviewed and stable  Complications: No apparent anesthesia complications

## 2014-01-18 NOTE — Procedures (Signed)
S/P bilateral common carotid arteriogram,followed by superselective infusion of 18 mg of IA Integrelin,and 3 passes with Solitaire FR retrieval device with minimal recanalization of occluded LT MCA prox and stent assisted angioplasty of symptomatic LT ICA prox stenosis.

## 2014-01-18 NOTE — Anesthesia Postprocedure Evaluation (Signed)
  Anesthesia Post-op Note  Patient: [name]  Procedure(s) Performed: Procedure(s): RADIOLOGY WITH ANESTHESIA (N/A)  Patient Location: PACU and ICU  Anesthesia Type:General  Level of Consciousness: sedated  Airway and Oxygen Therapy: Patient remains intubated per anesthesia plan  Post-op Pain: mild  Post-op Assessment: Post-op Vital signs reviewed  Post-op Vital Signs: Reviewed  Last Vitals:  Filed Vitals:   01/15/2014 1306  BP: 155/76  Pulse: 82  Temp: 36.8 C  Resp: 18    Complications: No apparent anesthesia complications

## 2014-01-18 NOTE — Anesthesia Postprocedure Evaluation (Signed)
  Anesthesia Post-op Note  Patient: [name]  Procedure(s) Performed: * No procedures listed *  Patient Location: PACU and ICU  Anesthesia Type:General  Level of Consciousness: sedated  Airway and Oxygen Therapy: Patient remains intubated per anesthesia plan  Post-op Pain: mild  Post-op Assessment: Post-op Vital signs reviewed  Post-op Vital Signs: Reviewed  Last Vitals:  Filed Vitals:   01/15/2014 1306  BP: 155/76  Pulse: 82  Temp: 36.8 C  Resp: 18    Complications: No apparent anesthesia complications

## 2014-01-18 NOTE — Code Documentation (Signed)
Per clinic notes patient at clinic for routine carotid dopplers. Patient complained of right sided numbness and tingling since Saturday. Tech noted during the exam she began to have dysarthria.  EMS transported patient to ED.  Per the ED nurse patient was initially speaking fluently with out slurring of words and was able to sign the EMS documentation.  Suddenly about 1243 patient became flaccid on the right and stopped speaking, she also had a left gaze.  Head Ct done and labs drawn.  Per MD dense MCA sign noted on CT scan.  NIHSS 27, Right hemi, Left gaze, mute.  Dr Doy Mince and Dr Estanislado Pandy spoke with family about risk and benefit of IR intervention.  2 IVs placed and foley placed. Patient transported to IR.

## 2014-01-18 NOTE — ED Provider Notes (Signed)
CSN: [id number]     Arrival date & time 01/29/2014  1239 History   First MD Initiated Contact with Patient 02/01/2014 1249     Chief Complaint  Patient presents with  . Stroke Symptoms     (Consider location/radiation/quality/duration/timing/severity/associated sxs/prior Treatment) The history is provided by the patient and medical records.   Level 5 Caveat:  AMS This is a 73 year old female with past medical history significant for hypertension, coronary artery disease, carotid artery disease, COPD, presenting to the ED from her cardiologist office following a carotid artery doppler-- has severe stenosis of left carotid-- when she called EMS due to progressively worsening headache, RUE weakness, and facial paresthesias.  On arrival to ED pt was AAOx3, able to answer questions and sign for her own paperwork for EMS (unsure which hand she signed with).  Pt abruptly had a decline in her mental status and became very altered.  Code stroke was called after initial triage.  Past Medical History  Diagnosis Date  . Pulmonary embolism     a. 07/2012 CTA chest negative for PE.  Marland Kitchen Rheumatoid arthritis(714.0)   . Hypertension   . Incontinence of feces   . Unspecified disorder of bladder   . Rectal prolapse   . Hiatal hernia   . IBS (irritable bowel syndrome)   . GERD (gastroesophageal reflux disease)   . Pain in limb   . Lumbago     hospital admission with back pain in April, 2011 repair of a disc herniation L4-L5   . Osteoarthrosis, unspecified whether generalized or localized, unspecified site   . COPD (chronic obstructive pulmonary disease)   . Tobacco use disorder   . CAD (coronary artery disease) Non-obstructive    a. Nuclear, May, 2010, no ischemia;  b. cath 2012, nonobstructive CAD ( 40% circumflex, 40% LAD) EF normal;  c. 07/2012 NSTEMI/Cath: nonobs dzs, EF 40% w/ periapical AK.;  d.  Carlton Adam Myoview (11/14): EF 61%, no ischemia, normal study.  . Carotid artery disease     a. Doppler,  July, 2010, 40-59% bilateral, b. Dopplers 6/13:  40-59% bilat (f/u  01/2014)  . Adenomatous polyp 02/2008  . AVM (arteriovenous malformation)     Small colonic  . Shortness of breath     Cardiac evaluation, Dr.Katz, pulmonary evaluation, 2012, Dr. Lamonte Sakai.  Marland Kitchen UTI (lower urinary tract infection)     Possible UTI by history June 14, 2011,   . Gout   . Shoulder pain, right   . Spinal stenosis   . Hyperlipidemia   . Glaucoma   . Splenomegaly   . Fatty infiltration of liver   . Cystitis   . Takotsubo cardiomyopathy Presumed    a. 07/2012 NSTEMI/Cath (nonobs dzs)/Echo:EF 45-50%, mid-distal antsept AK w/ Gr 1 DD.;  b.  Echo 09/2012: EF 55%, mild LAE  . Dysphagia     a. s/p prior esophageal dilatation. //  Dysarthria and dysphasia hospitalization December, 2013, neurology workup ongoing  . CHF (congestive heart failure)   . Pneumonia     hx of; has not had since taken pneumonia vaccine  . Elevated CPK     Been evaluated with muscle biopsy, February, 2014  . Speech abnormality     January, 2014, question small CVA  . Depression   . Inclusion body myositis    Past Surgical History  Procedure Laterality Date  . Laminectomy      lumbar, HX of L4-5 x 2  . Cervical disc compression      c5-c6 Hx  of  . Appendectomy    . Cholecystectomy    . Bladder surgery      x 2  . Vaginal hysterectomy    . Rectocele repair    . Interstim medronic stimulator  04/2010  . Back surgery      x 2  . Eye surgery      bilateral cataract removal  . Cardiac catheterization  07/28/12    no PCI  . Colonoscopy    . Muscle biopsy  09/17/2012    Procedure: GASTROCNEMIUS MUSCLE BIOPSY;  Surgeon: Winfield Cunas, MD;  Location: [facility];  Service: Neurosurgery;  Laterality: Right;  RIGHT DELTOID muscle biopsy   Family History  Problem Relation Age of Onset  . Arthritis Brother   . Colon cancer Paternal Grandmother   . Lung cancer Father     x 3 uncles and a aunt  . Brain cancer Father   . Heart disease  Mother   . Kidney disease Mother     chronic UTI  . Diabetes Mother   . Diabetes Sister   . Ovarian cancer Maternal Aunt   . Heart disease Maternal Uncle   . Kidney cancer Maternal Uncle   . Heart disease Paternal Aunt   . Stomach cancer Paternal Aunt   . Pancreatic cancer Maternal Grandmother   . Esophageal cancer Neg Hx   . Rectal cancer Neg Hx    History  Substance Use Topics  . Smoking status: Former Smoker -- 1.00 packs/day for 30 years    Quit date: 08/14/2007  . Smokeless tobacco: Never Used  . Alcohol Use: No   OB History   Grav Para Term Preterm Abortions TAB SAB Ect Mult Living                 Review of Systems  Unable to perform ROS: Mental status change      Allergies  Methotrexate derivatives; Codeine; Imuran; Levofloxacin; Lunesta; and Nitrofurantoin  Home Medications   Prior to Admission medications   Medication Sig Start Date End Date Taking? Authorizing Provider  albuterol (ACCUNEB) 1.25 MG/3ML nebulizer solution Take 3 mLs (1.25 mg total) by nebulization every 6 (six) hours as needed for wheezing. 09/15/13   Collene Gobble, MD  albuterol (PROVENTIL) (2.5 MG/3ML) 0.083% nebulizer solution Take 3 mLs (2.5 mg total) by nebulization every 6 (six) hours as needed for wheezing. 09/15/13   Collene Gobble, MD  Ascorbic Acid (VITAMIN C) 500 MG tablet Take 1,000 mg by mouth daily.     Historical Provider, MD  aspirin 81 MG tablet Take 81 mg by mouth daily.      Historical Provider, MD  atorvastatin (LIPITOR) 80 MG tablet Take 1 tablet (80 mg total) by mouth daily at 6 PM. 07/30/12   Rogelia Mire, NP  Cholecalciferol 2000 UNITS TABS Take 4,000 Units by mouth 2 (two) times daily.    Historical Provider, MD  furosemide (LASIX) 40 MG tablet Take 40 mg by mouth daily.    Historical Provider, MD  isosorbide mononitrate (IMDUR) 30 MG 24 hr tablet Take 0.5 tablets (15 mg total) by mouth daily. 06/15/13   Liliane Shi, PA-C  leflunomide (ARAVA) 20 MG tablet Take 20  mg by mouth daily.     Historical Provider, MD  losartan (COZAAR) 50 MG tablet TAKE 1 TABLET (50 MG TOTAL) BY MOUTH DAILY. 09/03/13   Carlena Bjornstad, MD  metoprolol succinate (TOPROL-XL) 25 MG 24 hr tablet TAKE 1 TABLET (25  MG TOTAL) BY MOUTH DAILY. TAKE WITH OR IMMEDIATELY FOLLOWING A MEAL. 09/04/13   Carlena Bjornstad, MD  NITROSTAT 0.4 MG SL tablet PLACE 1 TABLET (0.4 MG TOTAL) UNDER THE TONGUE EVERY 5 (FIVE) MINUTES AS NEEDED FOR CHEST PAIN. 04/25/13   Carlena Bjornstad, MD  pantoprazole (PROTONIX) 40 MG tablet Take 80 mg by mouth daily.    Historical Provider, MD  potassium chloride (KLOR-CON 10) 10 MEQ tablet TAKE 2 TABLETS (20 MEQ TOTAL) BY MOUTH DAILY. 03/12/13   Carlena Bjornstad, MD  PROAIR HFA 108 (90 BASE) MCG/ACT inhaler Inhale 2 puffs into the lungs every 6 (six) hours as needed. For shortness breath 08/09/12   Historical Provider, MD  ranitidine (ZANTAC) 300 MG tablet Take 300 mg by mouth at bedtime.    Historical Provider, MD  trimethoprim (TRIMPEX) 100 MG tablet Take 100 mg by mouth daily.    Historical Provider, MD   BP 155/76  Pulse 82  Temp(Src) 98.2 F (36.8 C) (Oral)  Resp 18  SpO2 100%  Physical Exam  Nursing note and vitals reviewed. Constitutional: She appears well-developed and well-nourished. No distress.  HENT:  Head: Normocephalic and atraumatic.  Eyes: Conjunctivae are normal.  Eyes are not tracking normally; left pupil dilated when compared with right  Cardiovascular: Normal rate, regular rhythm and normal heart sounds.   Pulmonary/Chest: Effort normal and breath sounds normal. No respiratory distress. She has no wheezes.  Protecting airway  Abdominal: Soft. Bowel sounds are normal.  Musculoskeletal: Normal range of motion.  Neurological:  Altered, unable to answer questions or follow commands, moaning responses only; spontaneously moving LUE and LLE; does not withdraw from pain in RUE, pain response normal in other 3 extremities  Skin: Skin is warm and dry. She is  not diaphoretic.  Psychiatric: She has a normal mood and affect.    ED Course  Procedures (including critical care time)  CRITICAL CARE Performed by: Larene Pickett   Total critical care time: 40  Critical care time was exclusive of separately billable procedures and treating other patients.  Critical care was necessary to treat or prevent imminent or life-threatening deterioration.  Critical care was time spent personally by me on the following activities: development of treatment plan with patient and/or surrogate as well as nursing, discussions with consultants, evaluation of patient's response to treatment, examination of patient, obtaining history from patient or surrogate, ordering and performing treatments and interventions, ordering and review of laboratory studies, ordering and review of radiographic studies, pulse oximetry and re-evaluation of patient's condition.'   Date: 01/21/2014  Rate: 83  Rhythm: normal sinus rhythm and premature ventricular contractions (PVC)  QRS Axis: normal  Intervals: normal  ST/T Wave abnormalities: normal  Conduction Disutrbances:none  Narrative Interpretation:   Old EKG Reviewed: unchanged   Labs Review Labs Reviewed  COMPREHENSIVE METABOLIC PANEL - Abnormal; Notable for the following:    GFR calc non Af Amer 64 (*)    GFR calc Af Amer 74 (*)    All other components within normal limits  I-STAT CHEM 8, ED - Abnormal; Notable for the following:    Calcium, Ion 1.07 (*)    All other components within normal limits  PROTIME-INR  APTT  CBC  DIFFERENTIAL  URINALYSIS, ROUTINE W REFLEX MICROSCOPIC  CBG MONITORING, ED  I-STAT TROPOININ, ED    Imaging Review Ct Head (brain) Wo Contrast     CLINICAL DATA:  Unresponsive.  Right-sided is flaccid.  EXAM: CT HEAD WITHOUT CONTRAST  TECHNIQUE: Contiguous axial images were obtained from the base of the skull through the vertex without intravenous contrast.  COMPARISON:  None.   FINDINGS: Interval development of dense left middle cerebral artery/ left carotid terminus consistent with thrombus which may indicate impending large left middle cerebral artery distribution infarct.  No intracranial hemorrhage.  No intracranial mass lesion noted on this unenhanced exam.  No hydrocephalus.  IMPRESSION: Interval development of dense left middle cerebral artery/ left carotid terminus consistent with thrombus which may indicate impending large left middle cerebral artery distribution infarct.  No intracranial hemorrhage.  These results were called by telephone at the time of interpretation on 02/07/2014 at 1:00 PM to Dr. Doy Mince who verbally acknowledged these results.   Electronically Signed   By: Chauncey Cruel M.D.   On: 01/12/2014 13:07     EKG Interpretation None      MDM   Final diagnoses:  Altered mental status  Carotid artery disease  Coronary artery disease  HTN (hypertension)   73 year old female with right arm and facila numbness/weakness beginning Saturday.  She was AAOx3 on arrival to ED but had rapid decline after arrival.  She is now essentially unresponsive and unable to provide any history or follow commands on exam.  She is protecting her airway and is in no respiratory distress.  Code stroke was called immediately after initial evaluation.  CT is negative.  Dr. Doy Mince has evaluated the pt, elected not to pursue IV tPA as pts sx started Saturday.  Pt has gone to IR suite and will be undergoing catheter angiogram.  Pt will be transferred to ICU after her procedure.  Larene Pickett, PA-C  [phone]

## 2014-01-18 NOTE — ED Notes (Addendum)
Patient transported to IR Neurology/Radiology.  Reported to IR Nurse.

## 2014-01-18 NOTE — ED Notes (Signed)
Patient has started having new neuro symptoms.  She will not communicate and left pupil is more dilated than right

## 2014-01-18 NOTE — Sedation Documentation (Signed)
Groin puncture, right femoral artery

## 2014-01-18 NOTE — Progress Notes (Signed)
Carotid duplex performed.  Patient came in for routine follow-up, with c/o right hand numbness/tingling which began Friday evening, and has gotten progressively worse since that time.  She also has right side facial numbness, blurred vision in the eye.  At the beginning of the exam, her speech was fairly normal.  15 minutes later, she was experiencing progressive dysphasia. She was seen briefly by Dr. Mare Ferrari, then sent to [facility] ER for evaluation.

## 2014-01-18 NOTE — ED Provider Notes (Addendum)
Medical screening examination/treatment/procedure(s) were conducted as a shared visit with non-physician practitioner(s) and myself.  I personally evaluated the patient during the encounter.   EKG Interpretation None      Patient here with acute onset of worsening weakness and mental status changes. Weakness began this weekend. Had a carotid Doppler test done prior to arrival which showed severe stenosis. She had an acute onset of altered mental status while here. Suspect that she is having a stroke at this time. Code stroke initiated. Workup pending  2:42 PM Patient has been seen by neurology and she has gone to the interventional suite  Leota Jacobsen, MD 01/19/2014 [place], MD 02/07/2014 [phone]

## 2014-01-18 NOTE — Progress Notes (Signed)
Patient transported to unit from CT on ventilator without difficulties.  Vent connected in unit and check performed.

## 2014-01-18 NOTE — Sedation Documentation (Signed)
To CT

## 2014-01-19 ENCOUNTER — Inpatient Hospital Stay (HOSPITAL_COMMUNITY): Payer: Medicare Other

## 2014-01-19 ENCOUNTER — Encounter (HOSPITAL_COMMUNITY): Payer: Self-pay | Admitting: Interventional Radiology

## 2014-01-19 ENCOUNTER — Telehealth (HOSPITAL_COMMUNITY): Payer: Self-pay | Admitting: Interventional Radiology

## 2014-01-19 DIAGNOSIS — R4182 Altered mental status, unspecified: Secondary | ICD-10-CM

## 2014-01-19 DIAGNOSIS — I6350 Cerebral infarction due to unspecified occlusion or stenosis of unspecified cerebral artery: Secondary | ICD-10-CM

## 2014-01-19 DIAGNOSIS — G936 Cerebral edema: Secondary | ICD-10-CM

## 2014-01-19 DIAGNOSIS — I517 Cardiomegaly: Secondary | ICD-10-CM

## 2014-01-19 LAB — CBC WITH DIFFERENTIAL/PLATELET
Basophils Absolute: 0 10*3/uL (ref 0.0–0.1)
Basophils Relative: 1 % (ref 0–1)
EOS PCT: 1 % (ref 0–5)
Eosinophils Absolute: 0.1 10*3/uL (ref 0.0–0.7)
HEMATOCRIT: 27.4 % — AB (ref 36.0–46.0)
Hemoglobin: 9.3 g/dL — ABNORMAL LOW (ref 12.0–15.0)
LYMPHS ABS: 0.8 10*3/uL (ref 0.7–4.0)
LYMPHS PCT: 13 % (ref 12–46)
MCH: 29.7 pg (ref 26.0–34.0)
MCHC: 33.9 g/dL (ref 30.0–36.0)
MCV: 87.5 fL (ref 78.0–100.0)
MONO ABS: 0.4 10*3/uL (ref 0.1–1.0)
Monocytes Relative: 6 % (ref 3–12)
Neutro Abs: 4.8 10*3/uL (ref 1.7–7.7)
Neutrophils Relative %: 78 % — ABNORMAL HIGH (ref 43–77)
Platelets: 164 10*3/uL (ref 150–400)
RBC: 3.13 MIL/uL — AB (ref 3.87–5.11)
RDW: 15.3 % (ref 11.5–15.5)
WBC: 6.1 10*3/uL (ref 4.0–10.5)

## 2014-01-19 LAB — GLUCOSE, CAPILLARY
GLUCOSE-CAPILLARY: 101 mg/dL — AB (ref 70–99)
Glucose-Capillary: 105 mg/dL — ABNORMAL HIGH (ref 70–99)
Glucose-Capillary: 89 mg/dL (ref 70–99)

## 2014-01-19 LAB — BASIC METABOLIC PANEL
BUN: 9 mg/dL (ref 6–23)
CO2: 19 meq/L (ref 19–32)
CREATININE: 0.78 mg/dL (ref 0.50–1.10)
Calcium: 7 mg/dL — ABNORMAL LOW (ref 8.4–10.5)
Chloride: 107 mEq/L (ref 96–112)
GFR calc Af Amer: >90 mL/min (ref >90)
GFR calc non Af Amer: 83 mL/min — ABNORMAL LOW (ref >90)
GLUCOSE: 113 mg/dL — AB (ref 70–99)
Potassium: 3.5 mEq/L — ABNORMAL LOW (ref 3.7–5.3)
SODIUM: 141 meq/L (ref 137–147)

## 2014-01-19 LAB — HEMOGLOBIN A1C
HEMOGLOBIN A1C: 5.4 % (ref <5.7)
MEAN PLASMA GLUCOSE: 108 mg/dL (ref <117)

## 2014-01-19 LAB — SODIUM
Sodium: 143 mEq/L (ref 137–147)
Sodium: 146 mEq/L (ref 137–147)

## 2014-01-19 LAB — TRIGLYCERIDES: Triglycerides: 261 mg/dL — ABNORMAL HIGH (ref <150)

## 2014-01-19 LAB — LIPID PANEL
CHOL/HDL RATIO: 4.7 ratio
CHOLESTEROL: 89 mg/dL (ref 0–200)
HDL: 19 mg/dL — ABNORMAL LOW (ref >39)
LDL CALC: 17 mg/dL (ref 0–99)
Triglycerides: 264 mg/dL — ABNORMAL HIGH (ref <150)
VLDL: 53 mg/dL — AB (ref 0–40)

## 2014-01-19 LAB — PLATELET INHIBITION P2Y12: PLATELET FUNCTION P2Y12: 210 [PRU] (ref 194–418)

## 2014-01-19 MED ORDER — CLOPIDOGREL BISULFATE 75 MG PO TABS
75.0000 mg | ORAL_TABLET | Freq: Once | ORAL | Status: AC
  Administered 2014-01-19: 75 mg via ORAL
  Filled 2014-01-19: qty 1

## 2014-01-19 MED ORDER — POTASSIUM CHLORIDE 20 MEQ/15ML (10%) PO LIQD
40.0000 meq | Freq: Once | ORAL | Status: AC
  Administered 2014-01-19: 40 meq
  Filled 2014-01-19: qty 30

## 2014-01-19 MED ORDER — SODIUM CHLORIDE 3 % IV SOLN
INTRAVENOUS | Status: DC
  Administered 2014-01-19: 20:00:00 via INTRAVENOUS
  Administered 2014-01-20 (×3): 75 mL/h via INTRAVENOUS
  Filled 2014-01-19 (×9): qty 500

## 2014-01-19 MED ORDER — VITAL HIGH PROTEIN PO LIQD
1000.0000 mL | ORAL | Status: DC
  Administered 2014-01-19: 1000 mL
  Filled 2014-01-19 (×3): qty 1000

## 2014-01-19 MED ORDER — CHLORHEXIDINE GLUCONATE 0.12 % MT SOLN
15.0000 mL | Freq: Two times a day (BID) | OROMUCOSAL | Status: DC
  Administered 2014-01-19 – 2014-01-21 (×5): 15 mL via OROMUCOSAL
  Filled 2014-01-19 (×5): qty 15

## 2014-01-19 MED ORDER — BIOTENE DRY MOUTH MT LIQD
15.0000 mL | Freq: Four times a day (QID) | OROMUCOSAL | Status: DC
  Administered 2014-01-19 – 2014-01-21 (×9): 15 mL via OROMUCOSAL

## 2014-01-19 MED ORDER — VITAL AF 1.2 CAL PO LIQD
1000.0000 mL | ORAL | Status: DC
  Administered 2014-01-20: 1000 mL
  Filled 2014-01-19 (×7): qty 1000

## 2014-01-19 MED ORDER — CLOPIDOGREL BISULFATE 75 MG PO TABS: 75.0000 mg | ORAL_TABLET | Freq: Once | ORAL | Status: DC

## 2014-01-19 MED ORDER — MIDAZOLAM HCL 2 MG/2ML IJ SOLN
2.0000 mg | Freq: Once | INTRAMUSCULAR | Status: AC
  Administered 2014-01-19: 2 mg via INTRAVENOUS
  Filled 2014-01-19: qty 2

## 2014-01-19 NOTE — Progress Notes (Signed)
[facility] Progress Note Patient Name: [name] [facility] DOB: 28-Jun-1941 MRN: [id number]  Date of Service  01/19/2014   HPI/Events of Note   pcxr for line  eICU Interventions  Wnl, may use   Intervention Category Minor Interventions: Clinical assessment - ordering diagnostic tests  Raylene Miyamoto 01/19/2014, 7:54 PM

## 2014-01-19 NOTE — Progress Notes (Signed)
PULMONARY / CRITICAL CARE MEDICINE  Name: Myria Steenbergen MRN: [id number] DOB: 11-Jul-1941    ADMISSION DATE:  01/17/2014 CONSULTATION DATE:    Loistine Chance MD :  Leonie Man PRIMARY SERVICE:  PCCM  CHIEF COMPLAINT:  CVA  BRIEF PATIENT DESCRIPTION: 73 y.o. F with hx of significant carotid artery stenosis, went to office for routine carotid dopplers then became flaccid on right with left facial gaze.  Sent to ED for further eval, found to have acute left MCA.  Taken to IR for angioplasty, returned to ICU on vent.  PCCM was consulted.  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 Hx of CAD and an improved Takotsubo CM (last EF 50%). Hx remote PE '88.    SIGNIFICANT EVENTS / STUDIES:  6/8 - outpatient appt for carotid dopplers, developed CVA symptoms, taken to ED and found to have acute left MCA stroke. 6/8 Head CT >>> impending large left MCA infarct >>> developing infarct in left MCA, no ME / MLS. 6/8 IR procedure >>> s/p bilateral common carotid arteriogram, infusion of integrelin, minimal recanalization of occluded left MCA and stent assisted angioplasty of symptomatic left ICA proximal stenosis. 6/8 echo >>> 6/9 MRI / MRA >>>  LINES / TUBES: OETT 6/8 >>> OGT 6/8 >>> Foley 6/8 >>> Left radial A-line 6/8 >>>  CULTURES: None  ANTIBIOTICS: None    SUBJECTIVE:  Afebrile Not waking up , off propofol since 8a Bp ok Weaning well on PS 5/5  VITAL SIGNS: Temp:  [95.5 F (35.3 C)-99.8 F (37.7 C)] 99.8 F (37.7 C) (06/09 0700) Pulse Rate:  [39-108] 93 (06/09 0900) Resp:  [14-30] 22 (06/09 0900) BP: (50-155)/(26-109) 141/56 mmHg (06/09 0900) SpO2:  [99 %-100 %] 100 % (06/09 0900) Arterial Line BP: (93-163)/(40-73) 116/54 mmHg (06/09 0900) FiO2 (%):  [30 %-100 %] 30 % (06/09 0900) Weight:  [73 kg (160 lb 15 oz)] 73 kg (160 lb 15 oz) (06/08 2100)  HEMODYNAMICS:    VENTILATOR SETTINGS: Vent Mode:  [-]  CPAP;PSV FiO2 (%):  [30 %-100 %] 30 % Set Rate:  [15 bmp] 15 bmp Vt Set:  [450 mL] 450 mL PEEP:  [5 cmH20] 5 cmH20 Pressure Support:  [5 cmH20] 5 cmH20 Plateau Pressure:  [15 cmH20-18 cmH20] 15 cmH20  INTAKE / OUTPUT: Intake/Output     06/08 0701 - 06/09 0700 06/09 0701 - 06/10 0700   I.V. (mL/kg) 2853.7 (39.1) 166.1 (2.3)   Blood 332.5    IV Piggyback 250    Total Intake(mL/kg) 3436.2 (47.1) 166.1 (2.3)   Urine (mL/kg/hr) 855 100 (0.6)   Blood 150    Total Output 1005 100   Net +2431.2 +66.1          PHYSICAL EXAMINATION: General: Sedate in bed on vent, in NAD. Neuro: Sedated. R hemiparesis HEENT: [place]/AT. PERRL, sclerae anicteric. Cardiovascular: RRR, no M/R/G.  Lungs: Respirations even and unlabored.  CTA bilaterally, No W/R/R.   Abdomen: BS x 4, soft, NT/ND.  Musculoskeletal: No gross deformities, no edema.  Skin: Intact, warm, no rashes.    LABS: CBC  Recent Labs Lab 01/11/2014 1259 01/13/2014 1310 01/19/14 0330  WBC 7.2  --  6.1  HGB 12.4 12.6 9.3*  HCT 37.6 37.0 27.4*  PLT 221  --  164   Coag's  Recent Labs Lab 02/09/2014 1259  APTT 27  INR 1.02   BMET  Recent Labs Lab 01/15/2014 1259 02/05/2014 1310 01/19/14 0330  NA 144  142 141  K 4.2 3.9 3.5*  CL 105 101 107  CO2 26  --  19  BUN 14 12 9   CREATININE 0.89 1.00 0.78  GLUCOSE 94 92 113*   Electrolytes  Recent Labs Lab  1259 01/19/14 0330  CALCIUM 9.1 7.0*   Sepsis Markers No results found for this basename: LATICACIDVEN, PROCALCITON, O2SATVEN,  in the last 168 hours ABG  Recent Labs Lab 02/08/2014 2318  PHART 7.305*  PCO2ART 42.6  PO2ART 411.0*   Liver Enzymes  Recent Labs Lab 01/28/2014 1259  AST 16  ALT 13  ALKPHOS 91  BILITOT 0.4  ALBUMIN 3.5   Cardiac Enzymes No results found for this basename: TROPONINI, PROBNP,  in the last 168 hours Glucose  Recent Labs Lab 01/12/2014 1303  GLUCAP 86    IMAGING: Ct Head Wo Contrast  01/21/2014   CLINICAL DATA:  Followup  a thrombectomy  EXAM: CT HEAD WITHOUT CONTRAST  TECHNIQUE: Contiguous axial images were obtained from the base of the skull through the vertex without intravenous contrast.  COMPARISON:  Earlier same day  FINDINGS: The patient has recently had thrombectomy in the left middle cerebral artery. The brainstem and cerebellum continue have a normal appearance. No abnormality seen in the right hemisphere. On the left, there is loss of gray-white differentiation in the insular region in the frontoparietal brain consistent with developing infarction in the left middle cerebral artery territory. There is contrast staining in the insula and in the left caudate. There is probably some contrast trapped within residual thrombus in the middle cerebral artery. No sign of subarachnoid hemorrhage. No hydrocephalus or extra-axial collection.  IMPRESSION: Evidence of developing infarction in the left middle cerebral artery territory. No mass effect or shift at this time. Contrast staining in the insular brain and left caudate. Probable contrast staining within what likely represents residual thrombus in the left middle cerebral artery.   Electronically Signed   By: Nelson Chimes M.D.   On: 01/21/2014 20:44   Ct Head (brain) Wo Contrast  01/14/2014   CLINICAL DATA:  Unresponsive.  Right-sided is flaccid.  EXAM: CT HEAD WITHOUT CONTRAST  TECHNIQUE: Contiguous axial images were obtained from the base of the skull through the vertex without intravenous contrast.  COMPARISON:  None.  FINDINGS: Interval development of dense left middle cerebral artery/ left carotid terminus consistent with thrombus which may indicate impending large left middle cerebral artery distribution infarct.  No intracranial hemorrhage.  No intracranial mass lesion noted on this unenhanced exam.  No hydrocephalus.  IMPRESSION: Interval development of dense left middle cerebral artery/ left carotid terminus consistent with thrombus which may indicate impending large  left middle cerebral artery distribution infarct.  No intracranial hemorrhage.  These results were called by telephone at the time of interpretation on 02/09/2014 at 1:00 PM to Dr. Doy Mince who verbally acknowledged these results.   Electronically Signed   By: Chauncey Cruel M.D.   On: 02/09/2014 13:07   Dg Chest Port 1 View  01/19/2014   CLINICAL DATA:  Intubation.  EXAM: PORTABLE CHEST - 1 VIEW  COMPARISON:  01/14/2014.  FINDINGS: Endotracheal tube and NG tube in good anatomic position. Cardiomegaly. Mild pulmonary vascular prominence. Bibasilar atelectasis and/or infiltrates are present. A component of bronchiectasis in the right lung base cannot be excluded. No pleural effusion or pneumothorax. No acute osseus abnormality. Prior cervical spine fusion.  IMPRESSION: 1. Line and tube position stable. 2. Bibasilar atelectasis and/or infiltrates. 3. Bronchiectasis in the right lung base  cannot be excluded. 4. Cardiomegaly with mild pulmonary venous congestion.   Electronically Signed   By: Marcello Moores  Register   On: 01/19/2014 07:18   Dg Chest Port 1 View  01/31/2014   CLINICAL DATA:  Stroke.  Endotracheal tube placement.  EXAM: PORTABLE CHEST - 1 VIEW  COMPARISON:  09/04/2013  FINDINGS: Endotracheal tube placement with tip measuring 3.1 cm above the carinal. Enteric tube tip is been placed with tip in the right upper quadrant consistent with location in the distal stomach. Shallow inspiration. Borderline heart size with normal pulmonary vascularity. No developing airspace disease or consolidation. Probable mild atelectasis in the lung bases. No pneumothorax. Postoperative changes in the cervical spine.  IMPRESSION: Endotracheal tube tip measures 3.1 cm above the carina. Borderline heart size. No evidence of consolidation or edema.   Electronically Signed   By: Lucienne Capers M.D.   On: 02/09/2014 21:44   CXR: NACPD   ASSESSMENT / PLAN:  PULMONARY A:   VDRF -airway protection Hx ILD Hx Tobacco use  disorder P:   - Full mechanical support, wean as able. - Goal SpO2>92. - VAP bundle. - DuoNebs / Albuterol. - SBTs as tolerated   CARDIOVASCULAR A:  Hx HTN Hx CAD Hx Takotsubo cardiomyopathy Hx carotid artery disease Transient hypotension P:  - Hold home Toprol, Losartan, Furosemide, Imdur. - restart as Bp rises - ASA/ Plavix per neuro. - Neo PRN. - F/u on echo.   RENAL A:  Hypokalemia P:  replete - Ns @ 75. - BMP in AM.  GASTROINTESTINAL A:   Hx GERD Hx IBS P:   -start  TF  - Continue Pantoprazole / Zantac.  HEMATOLOGIC A:   Anemia post procedure (Hgb 7.5)-s/p 1 unit PRBC P:   - VTE Proph:  SCD's.   INFECTIOUS A:   No acute issues. P:   - Monitor for fever / leukocytosis.  ENDOCRINE  A:   No acute issues. P:   - Monitor glucose on BMP.  NEUROLOGIC A:   Acute CVA - s/p bilateral common carotid arteriogram, infusion of integrelin, minimal recanalization of occluded left MCA and stent assisted angioplasty of symptomatic left ICA proximal stenosis. Acute encephalopathy P:   - Sedation:  fent int +Propofol. - Goal RASS 0  - Daily WUA.   MUSCULOSKELETAL A: Rheumatoid Arthritis Hx myositis P: - Hold home leflunomide for now.  Summary - Mental status main barrier to extubation here  I have personally obtained history, examined patient, evaluated and interpreted laboratory and imaging results, reviewed medical records, formulated assessment / plan and placed orders.  CRITICAL CARE:  The patient is critically ill with multiple organ systems failure and requires high complexity decision making for assessment and support, frequent evaluation and titration of therapies, application of advanced monitoring technologies and extensive interpretation of multiple databases. Critical Care Time devoted to patient care services described in this note is 35 minutes.   Kara Mead MD. Shade Flood. [facility] Pager [phone] If no response  call [phone]

## 2014-01-19 NOTE — Progress Notes (Signed)
1 Day Post-Op  Subjective: CVA Recanalization of L MCA with IA integrilin and angioplasty/stent placement On vent; no response Does move left arm and leg spontaneously  Objective: Vital signs in last 24 hours: Temp:  [95.5 F (35.3 C)-99.8 F (37.7 C)] 99.8 F (37.7 C) (06/09 0700) Pulse Rate:  [39-119] 101 (06/09 1115) Resp:  [12-30] 12 (06/09 1115) BP: (50-155)/(26-109) 114/46 mmHg (06/09 1115) SpO2:  [99 %-100 %] 100 % (06/09 1115) Arterial Line BP: (71-163)/(40-73) 71/44 mmHg (06/09 1115) FiO2 (%):  [30 %-100 %] 30 % (06/09 1100) Weight:  [73 kg (160 lb 15 oz)] 73 kg (160 lb 15 oz) (06/08 2100)    Intake/Output from previous day: 06/08 0701 - 06/09 0700 In: 3436.2 [I.V.:2853.7; Blood:332.5; IV Piggyback:250] Out: 1005 [Urine:855; Blood:150] Intake/Output this shift: Total I/O In: 241.1 [I.V.:241.1] Out: 160 [Urine:160]  PE:  Low grade temp; vss On vent - no response to command Does move Left hand and leg spontaneously Rt groin : no hematoma; sheath removed Rt foot: 2+ pulses CT head ordered  Lab Results:   Recent Labs  01/29/2014 1259 01/17/2014 1310 01/19/14 0330  WBC 7.2  --  6.1  HGB 12.4 12.6 9.3*  HCT 37.6 37.0 27.4*  PLT 221  --  164   BMET  Recent Labs  01/26/2014 1259 02/02/2014 1310 01/19/14 0330  NA 144 142 141  K 4.2 3.9 3.5*  CL 105 101 107  CO2 26  --  19  GLUCOSE 94 92 113*  BUN 14 12 9   CREATININE 0.89 1.00 0.78  CALCIUM 9.1  --  7.0*   PT/INR  Recent Labs  01/11/2014 1259  LABPROT 13.2  INR 1.02   ABG  Recent Labs  01/31/2014 2318  PHART 7.305*  HCO3 21.3    Studies/Results: Ct Head Wo Contrast  01/16/2014   CLINICAL DATA:  Followup a thrombectomy  EXAM: CT HEAD WITHOUT CONTRAST  TECHNIQUE: Contiguous axial images were obtained from the base of the skull through the vertex without intravenous contrast.  COMPARISON:  Earlier same day  FINDINGS: The patient has recently had thrombectomy in the left middle cerebral artery. The  brainstem and cerebellum continue have a normal appearance. No abnormality seen in the right hemisphere. On the left, there is loss of gray-white differentiation in the insular region in the frontoparietal brain consistent with developing infarction in the left middle cerebral artery territory. There is contrast staining in the insula and in the left caudate. There is probably some contrast trapped within residual thrombus in the middle cerebral artery. No sign of subarachnoid hemorrhage. No hydrocephalus or extra-axial collection.  IMPRESSION: Evidence of developing infarction in the left middle cerebral artery territory. No mass effect or shift at this time. Contrast staining in the insular brain and left caudate. Probable contrast staining within what likely represents residual thrombus in the left middle cerebral artery.   Electronically Signed   By: Nelson Chimes M.D.   On: 02/06/2014 20:44   Ct Head (brain) Wo Contrast  02/09/2014   CLINICAL DATA:  Unresponsive.  Right-sided is flaccid.  EXAM: CT HEAD WITHOUT CONTRAST  TECHNIQUE: Contiguous axial images were obtained from the base of the skull through the vertex without intravenous contrast.  COMPARISON:  None.  FINDINGS: Interval development of dense left middle cerebral artery/ left carotid terminus consistent with thrombus which may indicate impending large left middle cerebral artery distribution infarct.  No intracranial hemorrhage.  No intracranial mass lesion noted on this unenhanced exam.  No hydrocephalus.  IMPRESSION: Interval development of dense left middle cerebral artery/ left carotid terminus consistent with thrombus which may indicate impending large left middle cerebral artery distribution infarct.  No intracranial hemorrhage.  These results were called by telephone at the time of interpretation on 01/17/2014 at 1:00 PM to Dr. Doy Mince who verbally acknowledged these results.   Electronically Signed   By: Chauncey Cruel M.D.   On: 02/07/2014  13:07   Dg Chest Port 1 View  01/19/2014   CLINICAL DATA:  Intubation.  EXAM: PORTABLE CHEST - 1 VIEW  COMPARISON:  02/09/2014.  FINDINGS: Endotracheal tube and NG tube in good anatomic position. Cardiomegaly. Mild pulmonary vascular prominence. Bibasilar atelectasis and/or infiltrates are present. A component of bronchiectasis in the right lung base cannot be excluded. No pleural effusion or pneumothorax. No acute osseus abnormality. Prior cervical spine fusion.  IMPRESSION: 1. Line and tube position stable. 2. Bibasilar atelectasis and/or infiltrates. 3. Bronchiectasis in the right lung base cannot be excluded. 4. Cardiomegaly with mild pulmonary venous congestion.   Electronically Signed   By: Marcello Moores  Register   On: 01/19/2014 07:18   Dg Chest Port 1 View  01/21/2014   CLINICAL DATA:  Stroke.  Endotracheal tube placement.  EXAM: PORTABLE CHEST - 1 VIEW  COMPARISON:  09/04/2013  FINDINGS: Endotracheal tube placement with tip measuring 3.1 cm above the carinal. Enteric tube tip is been placed with tip in the right upper quadrant consistent with location in the distal stomach. Shallow inspiration. Borderline heart size with normal pulmonary vascularity. No developing airspace disease or consolidation. Probable mild atelectasis in the lung bases. No pneumothorax. Postoperative changes in the cervical spine.  IMPRESSION: Endotracheal tube tip measures 3.1 cm above the carina. Borderline heart size. No evidence of consolidation or edema.   Electronically Signed   By: Lucienne Capers M.D.   On:  21:44    Anti-infectives: Anti-infectives   None      Assessment/Plan: s/p Procedure(s): RADIOLOGY WITH ANESTHESIA (N/A)  CVA L MCA recanalization with IA Integrilin and pta/stent Dr Estanislado Pandy in IR 01/15/2014 On vent Spontaneous movement on left Will follow Need p2y12 in am CT head ordered   LOS: 1 day    Lavonia Drafts 01/19/2014

## 2014-01-19 NOTE — Telephone Encounter (Signed)
Called RN station on [facility] to notify them that pt has a rectal stimulator implant and cannot have MRI/MRA studies. They states they will change the existing order in EPIC. JMichaux

## 2014-01-19 NOTE — Progress Notes (Signed)
PT Cancellation Note  Patient Details Name: [name] MRN: [id number] DOB: Aug 08, 1941   Cancelled Treatment:    Reason Eval/Treat Not Completed: Patient not medically ready (bedrest orders active)   Duncan Dull 01/19/2014, 7:33 AM Alben Deeds, PT DPT  [phone]

## 2014-01-19 NOTE — Progress Notes (Signed)
60f sheath removed 1030am exoseal closure device used no complications v-pad, pressure dressing, and 10lb sandbag applied.   Groin site reviewed with Dominica rn.

## 2014-01-19 NOTE — Progress Notes (Signed)
OT Cancellation Note  Patient Details Name: [name] MRN: [id number] DOB: 10-Jan-1941   Cancelled Treatment:    Reason Eval/Treat Not Completed: Patient not medically ready (bedrest. will discuss with nsg.)  [facility], OTR/L  [phone] 01/19/2014 01/19/2014, 9:56 AM

## 2014-01-19 NOTE — Progress Notes (Signed)
[facility] ADULT ICU REPLACEMENT PROTOCOL FOR AM LAB REPLACEMENT ONLY  The patient does not apply for the [facility] Adult ICU Electrolyte Replacment Protocol based on the criteria listed below:    Is urine output >/= 0.5 ml/kg/hr for the last 6 hours? no Patient's UOP is 0.2 ml/kg/hr  Abnormal electrolyte(s): K3.5  If a panic level lab has been reported, has the CCM MD in charge been notified? yes.   Physician:  Patricia Nettle, MD  Vear Clock 01/19/2014 5:35 AM

## 2014-01-19 NOTE — Progress Notes (Signed)
SLP Cancellation Note  Patient Details Name: [name] MRN: [id number] DOB: 1941/01/05   Cancelled treatment:       Reason Eval/Treat Not Completed: Medical issues which prohibited therapy.  Will follow for readiness/improvements.    Frederich Balding Shirl [place] 01/19/2014, 11:42 AM

## 2014-01-19 NOTE — Progress Notes (Signed)
  Echocardiogram 2D Echocardiogram has been performed.  Basilia Jumbo 01/19/2014, 10:28 AM

## 2014-01-19 NOTE — Procedures (Signed)
Central Venous Catheter Insertion Procedure Note Melondy Blanchard [facility] [id number] 1940-12-28  Procedure: Insertion of Central Venous Catheter Indications: Assessment of intravascular volume, Drug and/or fluid administration and Frequent blood sampling  Procedure Details Consent: Risks of procedure as well as the alternatives and risks of each were explained to the (patient/caregiver).  Consent for procedure obtained. Time Out: Verified patient identification, verified procedure, site/side was marked, verified correct patient position, special equipment/implants available, medications/allergies/relevent history reviewed, required imaging and test results available.  Performed  Maximum sterile technique was used including antiseptics, cap, gloves, gown, hand hygiene, mask and sheet. Skin prep: Chlorhexidine; local anesthetic administered A antimicrobial bonded/coated triple lumen catheter was placed in the left internal jugular vein using the Seldinger technique. Ultrasound guidance used.yes Catheter placed to 19 cm. Blood aspirated via all 3 ports and then flushed x 3. Line sutured x 2 and dressing applied.  Evaluation Blood flow good Complications: No apparent complications Patient did tolerate procedure well. Chest X-ray ordered to verify placement.  CXR: CVl in good position  Georgann Housekeeper, ACNP [facility] Pulmonology/Critical Care Pager [phone] or [phone]  Rigoberto Noel MD

## 2014-01-19 NOTE — Progress Notes (Signed)
INITIAL NUTRITION ASSESSMENT  DOCUMENTATION CODES Per approved criteria  -Not Applicable   INTERVENTION: Initiate Vital AF 1.2 @ 20 ml/hr and increase by 10 ml every 4 hours to goal rate of 50 ml/hr.   Tube feeding regimen provides 1440 kcal (100% of needs), 90 grams of protein, and 973 ml of H2O.   NUTRITION DIAGNOSIS: Inadequate oral intake related to inability to eat as evidenced by NPO status  Goal: Pt to meet >/= 90% of their estimated nutrition needs   Monitor:  Vent status, TF initiation and tolerance, weight trend, labs   Reason for Assessment: Consult received to initiate and manage enteral nutrition support.  73 y.o. female  Admitting Dx: <principal problem not specified>  ASSESSMENT: Patient is currently intubated on ventilator support after CVA with recanalization of L MCA with stent placement.  MV: 9.7 L/min Temp (24hrs), Avg:98.6 F (37 C), Min:95.5 F (35.3 C), Max:100.5 F (38.1 C)  Propofol: off  Potassium low.  Pt out of room at CT.   Height: Ht Readings from Last 1 Encounters:  01/16/2014 5\' 3"  (1.6 m)    Weight: Wt Readings from Last 1 Encounters:  02/05/2014 160 lb 15 oz (73 kg)    Ideal Body Weight: 52.2 kg   % Ideal Body Weight: 139%  Wt Readings from Last 10 Encounters:  01/15/2014 160 lb 15 oz (73 kg)  02/05/2014 160 lb 15 oz (73 kg)  01/11/14 161 lb (73.029 kg)  09/15/13 157 lb (71.215 kg)  09/05/13 160 lb 9.6 oz (72.848 kg)  07/07/13 161 lb (73.029 kg)  06/23/13 162 lb (73.483 kg)  06/15/13 163 lb (73.936 kg)  05/12/13 166 lb (75.297 kg)  04/28/13 166 lb (75.297 kg)    Usual Body Weight: 160 lb  % Usual Body Weight: 100%  BMI:  Body mass index is 28.52 kg/(m^2).  Estimated Nutritional Needs: Kcal: 6195 Protein: 90-115 grams Fluid: > 1.5 L/day  Skin: no issues noted  Diet Order: NPO  EDUCATION NEEDS: -No education needs identified at this time   Intake/Output Summary (Last 24 hours) at 01/19/14 1611 Last data  filed at 01/19/14 1600  Gross per 24 hour  Intake 3216.03 ml  Output   1400 ml  Net 1816.03 ml    Last BM: PTA   Labs:   Recent Labs Lab 01/19/2014 1259 01/20/2014 1310 01/19/14 0330  NA 144 142 141  K 4.2 3.9 3.5*  CL 105 101 107  CO2 26  --  19  BUN 14 12 9   CREATININE 0.89 1.00 0.78  CALCIUM 9.1  --  7.0*  GLUCOSE 94 92 113*    CBG (last 3)   Recent Labs  01/16/2014 1303 01/19/14 1138 01/19/14 1542  GLUCAP 86 101* 105*    Scheduled Meds: . antiseptic oral rinse  15 mL Mouth Rinse QID  . aspirin  300 mg Rectal Daily   Or  . aspirin  325 mg Oral Daily  . chlorhexidine  15 mL Mouth Rinse BID  . clopidogrel  75 mg Oral Q breakfast  . famotidine  20 mg Oral QHS  . feeding supplement (VITAL HIGH PROTEIN)  1,000 mL Per Tube Q24H  . ipratropium-albuterol  3 mL Nebulization Q6H  . pantoprazole (PROTONIX) IV  40 mg Intravenous Q24H    Continuous Infusions: . sodium chloride 75 mL/hr at 01/19/14 0235  . niCARDipine Stopped (01/11/2014 2300)  . propofol Stopped (01/19/14 1550)    Past Medical History  Diagnosis Date  . Pulmonary embolism  a. 07/2012 CTA chest negative for PE.  Marland Kitchen Rheumatoid arthritis(714.0)   . Hypertension   . Incontinence of feces   . Unspecified disorder of bladder   . Rectal prolapse   . Hiatal hernia   . IBS (irritable bowel syndrome)   . GERD (gastroesophageal reflux disease)   . Pain in limb   . Lumbago     hospital admission with back pain in April, 2011 repair of a disc herniation L4-L5   . Osteoarthrosis, unspecified whether generalized or localized, unspecified site   . COPD (chronic obstructive pulmonary disease)   . Tobacco use disorder   . CAD (coronary artery disease) Non-obstructive    a. Nuclear, May, 2010, no ischemia;  b. cath 2012, nonobstructive CAD ( 40% circumflex, 40% LAD) EF normal;  c. 07/2012 NSTEMI/Cath: nonobs dzs, EF 40% w/ periapical AK.;  d.  Carlton Adam Myoview (11/14): EF 61%, no ischemia, normal study.  .  Carotid artery disease     a. Doppler, July, 2010, 40-59% bilateral, b. Dopplers 6/13:  40-59% bilat (f/u  01/2014)  . Adenomatous polyp 02/2008  . AVM (arteriovenous malformation)     Small colonic  . Shortness of breath     Cardiac evaluation, Dr.Katz, pulmonary evaluation, 2012, Dr. Lamonte Sakai.  Marland Kitchen UTI (lower urinary tract infection)     Possible UTI by history June 14, 2011,   . Gout   . Shoulder pain, right   . Spinal stenosis   . Hyperlipidemia   . Glaucoma   . Splenomegaly   . Fatty infiltration of liver   . Cystitis   . Takotsubo cardiomyopathy Presumed    a. 07/2012 NSTEMI/Cath (nonobs dzs)/Echo:EF 45-50%, mid-distal antsept AK w/ Gr 1 DD.;  b.  Echo 09/2012: EF 55%, mild LAE  . Dysphagia     a. s/p prior esophageal dilatation. //  Dysarthria and dysphasia hospitalization December, 2013, neurology workup ongoing  . CHF (congestive heart failure)   . Pneumonia     hx of; has not had since taken pneumonia vaccine  . Elevated CPK     Been evaluated with muscle biopsy, February, 2014  . Speech abnormality     January, 2014, question small CVA  . Depression   . Inclusion body myositis     Past Surgical History  Procedure Laterality Date  . Laminectomy      lumbar, HX of L4-5 x 2  . Cervical disc compression      c5-c6 Hx of  . Appendectomy    . Cholecystectomy    . Bladder surgery      x 2  . Vaginal hysterectomy    . Rectocele repair    . Interstim medronic stimulator  04/2010  . Back surgery      x 2  . Eye surgery      bilateral cataract removal  . Cardiac catheterization  07/28/12    no PCI  . Colonoscopy    . Muscle biopsy  09/17/2012    Procedure: GASTROCNEMIUS MUSCLE BIOPSY;  Surgeon: Winfield Cunas, MD;  Location: [facility];  Service: Neurosurgery;  Laterality: Right;  RIGHT DELTOID muscle biopsy    Maylon Peppers RD, LDN, [place] Pager [phone] After Hours Pager

## 2014-01-19 NOTE — Progress Notes (Signed)
UR completed.  Jacoria Keiffer, RN BSN [facility] CCM Trauma/Neuro ICU Case Manager [phone]  

## 2014-01-19 NOTE — Progress Notes (Signed)
Stroke Team Progress Note  HISTORY [name] is an 73 y.o. female who reports that on Friday night 01/15/2014 she began to notice some right upper extremity weakness and numbness. The patient had fallen earlier and had weakness since that time, the numbness would come and go. That is not particularly unusual for her since she has weakness at times related to her other medical problems. She went to her PCP today and a carotid doppler was performed. She had significant carotid disease noted and was sent to the ED. While being evaluated in the ED the patient became poorly responsive with left eye deviation and right upper and lower extremity weakness. Code stroke was called at that time. last known well 01/15/2014, time unknown. Patient was not administered TPA secondary to delay in arrival. She was sent to intervention where she received IA integrelin as well as partial recanalization of R prox MCA, with R ICA stent placement. Post CT with hemorrhage vs contrast stain. she was loaded with aspirin and plavix.  She was admitted to the neuro ICU for further evaluation and treatment.  SUBJECTIVE Her sister and 2 daughters are at the bedside.  History reviewed.  OBJECTIVE Most recent Vital Signs: Filed Vitals:   01/19/14 0800 01/19/14 0842 01/19/14 0843 01/19/14 0846  BP: 123/34  118/53 113/50  Pulse: 96  87 87  Temp:      TempSrc:      Resp: 22  17 17   Height:      Weight:      SpO2: 100% 100% 100% 100%   CBG (last 3)   Recent Labs  01/26/2014 1303  GLUCAP 86    IV Fluid Intake:   . sodium chloride 75 mL/hr at 01/19/14 0235  . niCARDipine Stopped (02/09/2014 2300)  . propofol Stopped (01/19/14 0800)    MEDICATIONS  . antiseptic oral rinse  15 mL Mouth Rinse QID  . aspirin  300 mg Rectal Daily   Or  . aspirin  325 mg Oral Daily  . chlorhexidine  15 mL Mouth Rinse BID  . clopidogrel  75 mg Oral Q breakfast  . famotidine  20 mg Oral QHS  . ipratropium-albuterol  3 mL Nebulization  Q6H  . pantoprazole (PROTONIX) IV  40 mg Intravenous Q24H   PRN:  acetaminophen, acetaminophen, albuterol, fentaNYL, ondansetron (ZOFRAN) IV, senna-docusate  Diet:  NPO  Activity:  Bedrest DVT Prophylaxis:  SCDs   CLINICALLY SIGNIFICANT STUDIES Basic Metabolic Panel:   Recent Labs Lab 01/20/2014 1259 02/08/2014 1310 01/19/14 0330  NA 144 142 141  K 4.2 3.9 3.5*  CL 105 101 107  CO2 26  --  19  GLUCOSE 94 92 113*  BUN 14 12 9   CREATININE 0.89 1.00 0.78  CALCIUM 9.1  --  7.0*   Liver Function Tests:   Recent Labs Lab 02/02/2014 1259  AST 16  ALT 13  ALKPHOS 91  BILITOT 0.4  PROT 6.4  ALBUMIN 3.5   CBC:   Recent Labs Lab 01/17/2014 1259 01/29/2014 1310 01/19/14 0330  WBC 7.2  --  6.1  NEUTROABS 5.0  --  4.8  HGB 12.4 12.6 9.3*  HCT 37.6 37.0 27.4*  MCV 87.0  --  87.5  PLT 221  --  164   Coagulation:   Recent Labs Lab 01/11/2014 1259  LABPROT 13.2  INR 1.02   Cardiac Enzymes: No results found for this basename: CKTOTAL, CKMB, CKMBINDEX, TROPONINI,  in the last 168 hours Urinalysis:   Recent Labs  Lab  1345  COLORURINE YELLOW  LABSPEC 1.026  PHURINE 6.0  GLUCOSEU NEGATIVE  HGBUR NEGATIVE  BILIRUBINUR NEGATIVE  KETONESUR NEGATIVE  PROTEINUR NEGATIVE  UROBILINOGEN 0.2  NITRITE NEGATIVE  LEUKOCYTESUR SMALL*   Lipid Panel    Component Value Date/Time   CHOL 89 01/19/2014 0330   TRIG 264* 01/19/2014 0330   HDL 19* 01/19/2014 0330   CHOLHDL 4.7 01/19/2014 0330   VLDL 53* 01/19/2014 0330   LDLCALC 17 01/19/2014 0330   HgbA1C  Lab Results  Component Value Date   HGBA1C 5.4 07/27/2012    Urine Drug Screen:   No results found for this basename: labopia,  cocainscrnur,  labbenz,  amphetmu,  thcu,  labbarb    Alcohol Level: No results found for this basename: ETH,  in the last 168 hours   CT of the brain   01/24/2014    Evidence of developing infarction in the left middle cerebral artery territory. No mass effect or shift at this time. Contrast staining  in the insular brain and left caudate. Probable contrast staining within what likely represents residual thrombus in the left middle cerebral artery.    01/24/2014    Interval development of dense left middle cerebral artery/ left carotid terminus consistent with thrombus which may indicate impending large left middle cerebral artery distribution infarct.  No intracranial hemorrhage.    Cerebral Angiogram 02/08/2014 S/P bilateral common carotid arteriogram,followed by superselective infusion of 18 mg of IA Integrelin,and 3 passes with Solitaire FR retrieval device with minimal recanalization of occluded LT MCA prox and stent assisted angioplasty of symptomatic LT ICA prox stenosis.  MRI/MRA of the brain  Rectal stimulator precludes  2D Echocardiogram    Carotid Doppler  See angio  CXR   01/19/2014    1. Line and tube position stable. 2. Bibasilar atelectasis and/or infiltrates. 3. Bronchiectasis in the right lung base cannot be excluded. 4. Cardiomegaly with mild pulmonary venous congestion.    02/05/2014    Endotracheal tube tip measures 3.1 cm above the carina. Borderline heart size. No evidence of consolidation or edema.     EKG  normal sinus rhythm. For complete results please see formal report.   Therapy Recommendations   Physical Exam   Elderly Caucasian lady intubated. Has right groin arterial sheath in place.  Afebrile. Head is nontraumatic. Neck is supple without bruit.  . Cardiac exam no murmur or gallop. Lungs are clear to auscultation. Distal pulses are well felt.. Neurological Exam : Lethargic with eyes closed. Will not open eyes to sternal rub but will move left side purposefully against gravity. Left gaze preference. Will not move eyes to the right even with doll's eye movements. Pupils 3 mm irregular but reactive. Fundi could not be visualized. Does not blink to threat bilaterally. Right lower face weakness. Tongue midline. Dense right hemiplegia with minimum withdrawal in the upper and  lower extremity to pain. Purposeful antigravity movements on the left side. Right plantar is upgoing and left is equivocal. Will not follow any commands ASSESSMENT Ms. [name] is a 73 y.o. female who developed right facial numbness, blurred vision and expressive aphasia during a carotid doppler. She continued to worsen after arrival to the ED. She was taken to intervention where she received IA Integrelin, mechanical thrombectomy , and stent assisted angioplasty of proximal LT ICA stenosis with minimal recanalization. Post procedure CT confirms a left MCA infarct with staining, likely contrast but cannot rule out hemorrhage. Will repeat CT in am. Infarct felt  to be thrombotic secondary to large vessel disease R ICA stenosis and R MCA stenosis.  On aspirin 81 mg orally every day prior to admission. Now on clopidogrel 75 mg orally every day and aspirin 300 mg suppository following plavix load post stent placement for secondary stroke prevention. Patient with resultant VDRF, right hemiplegia, nonverbal, global aphasia. Stroke work up underway.  Acute respiratory failure, intubated for neuro intervention Acute blood loss anemia, Hgb 7.5, transfused 1u PRBCs  Hypertension Hyperlipidemia, LDL 17, on lipitor 80 mg daily PTA, now on no statin, goal LDL < 100 (< 70 for diabetics) Tobacco abuse, quit 2009 CAD Takosubo cardiomyopahty Carotid artery disease, 40-59% bilat since 2010 Known dysphagia COPD Colonic AVM  depression  Hx PE, post op, treated with course of coumadin  RA  Low back pain, has rectal simulator due to nerve damage from back surgery. Is not compatible with MRI  Dr. Leonie Man discussed diagnosis, prognosis,  treatment options and plan of care with family - expected recovery is limited. End-of life issues addressed. Family is tearful. Will continue ongoing aggressive care for now and discuss further with family over the next few days as workup is completed.   Hospital day #  1  TREATMENT/PLAN  Continue clopidogrel 75 mg orally every day and aspirin 300 mg suppository for secondary stroke prevention.  D/c groin shealth  Repeat CT head later this afternoon  F/u 2D echo  Resume statin once feeding route established  Discussed with Drs. Deveshwar and Elsworth Soho  SIGNED Burnetta Sabin, MSN, RN, ANVP-BC, ANP-BC, GNP-BC [facility] Pager: [phone] 01/19/2014 9:08 AM This patient is critically ill and at significant risk of neurological worsening, death and care requires constant monitoring of vital signs, hemodynamics,respiratory and cardiac monitoring,review of multiple databases, neurological assessment, discussion with family, other specialists and medical decision making of high complexity. I spent 40 minutes of neurocritical care time  in the care of  this patient.  I have personally obtained a history, examined the patient, evaluated imaging results, and formulated the assessment and plan of care. I agree with the above. Antony Contras, MD   To contact Stroke Continuity provider, please refer to [email or web address] After hours, contact General Neurology

## 2014-01-19 NOTE — Progress Notes (Signed)
RT Note: Patient was transported to and from CT with no complications. Patient tolerated transport well and is back in her room now. RT will continue to monitor.

## 2014-01-19 NOTE — Progress Notes (Signed)
CT scan complete.  Notified Burnetta Sabin, NP of CT results. No new orders received. Will continue to monitor.

## 2014-01-20 ENCOUNTER — Inpatient Hospital Stay (HOSPITAL_COMMUNITY): Payer: Medicare Other

## 2014-01-20 DIAGNOSIS — G936 Cerebral edema: Secondary | ICD-10-CM | POA: Diagnosis not present

## 2014-01-20 LAB — SODIUM
SODIUM: 158 meq/L — AB (ref 137–147)
Sodium: 152 mEq/L — ABNORMAL HIGH (ref 137–147)
Sodium: 156 mEq/L — ABNORMAL HIGH (ref 137–147)

## 2014-01-20 LAB — BASIC METABOLIC PANEL
BUN: 9 mg/dL (ref 6–23)
CALCIUM: 7.2 mg/dL — AB (ref 8.4–10.5)
CO2: 19 mEq/L (ref 19–32)
CREATININE: 0.7 mg/dL (ref 0.50–1.10)
Chloride: 118 mEq/L — ABNORMAL HIGH (ref 96–112)
GFR calc Af Amer: >90 mL/min (ref >90)
GFR calc non Af Amer: 86 mL/min — ABNORMAL LOW (ref >90)
GLUCOSE: 105 mg/dL — AB (ref 70–99)
Potassium: 3.4 mEq/L — ABNORMAL LOW (ref 3.7–5.3)
Sodium: 151 mEq/L — ABNORMAL HIGH (ref 137–147)

## 2014-01-20 LAB — CBC
HCT: 23.9 % — ABNORMAL LOW (ref 36.0–46.0)
Hemoglobin: 7.9 g/dL — ABNORMAL LOW (ref 12.0–15.0)
MCH: 29.5 pg (ref 26.0–34.0)
MCHC: 33.1 g/dL (ref 30.0–36.0)
MCV: 89.2 fL (ref 78.0–100.0)
PLATELETS: 148 10*3/uL — AB (ref 150–400)
RBC: 2.68 MIL/uL — ABNORMAL LOW (ref 3.87–5.11)
RDW: 15.8 % — AB (ref 11.5–15.5)
WBC: 4.9 10*3/uL (ref 4.0–10.5)

## 2014-01-20 LAB — POCT I-STAT, CHEM 8
BUN: 8 mg/dL (ref 6–23)
Calcium, Ion: 0.98 mmol/L — ABNORMAL LOW (ref 1.13–1.30)
Chloride: 109 mEq/L (ref 96–112)
Creatinine, Ser: 0.6 mg/dL (ref 0.50–1.10)
Glucose, Bld: 111 mg/dL — ABNORMAL HIGH (ref 70–99)
HCT: 22 % — ABNORMAL LOW (ref 36.0–46.0)
Hemoglobin: 7.5 g/dL — ABNORMAL LOW (ref 12.0–15.0)
Potassium: 3.6 mEq/L — ABNORMAL LOW (ref 3.7–5.3)
Sodium: 141 mEq/L (ref 137–147)
TCO2: 22 mmol/L (ref 0–100)

## 2014-01-20 LAB — POCT ACTIVATED CLOTTING TIME
ACTIVATED CLOTTING TIME: 155 s
ACTIVATED CLOTTING TIME: 182 s
Activated Clotting Time: 154 seconds
Activated Clotting Time: 155 seconds
Activated Clotting Time: 182 seconds
Activated Clotting Time: 193 seconds

## 2014-01-20 LAB — GLUCOSE, CAPILLARY
GLUCOSE-CAPILLARY: 115 mg/dL — AB (ref 70–99)
Glucose-Capillary: 103 mg/dL — ABNORMAL HIGH (ref 70–99)
Glucose-Capillary: 119 mg/dL — ABNORMAL HIGH (ref 70–99)
Glucose-Capillary: 134 mg/dL — ABNORMAL HIGH (ref 70–99)
Glucose-Capillary: 77 mg/dL (ref 70–99)
Glucose-Capillary: 93 mg/dL (ref 70–99)

## 2014-01-20 MED ORDER — ENOXAPARIN SODIUM 40 MG/0.4ML ~~LOC~~ SOLN
40.0000 mg | SUBCUTANEOUS | Status: DC
  Administered 2014-01-20 – 2014-01-21 (×2): 40 mg via SUBCUTANEOUS
  Filled 2014-01-20 (×2): qty 0.4

## 2014-01-20 MED ORDER — ATORVASTATIN CALCIUM 80 MG PO TABS
80.0000 mg | ORAL_TABLET | Freq: Every day | ORAL | Status: DC
  Administered 2014-01-20: 80 mg via ORAL
  Filled 2014-01-20 (×2): qty 1

## 2014-01-20 MED ORDER — CITALOPRAM HYDROBROMIDE 40 MG PO TABS
40.0000 mg | ORAL_TABLET | Freq: Every day | ORAL | Status: DC
  Administered 2014-01-20 – 2014-01-21 (×2): 40 mg via ORAL
  Filled 2014-01-20 (×2): qty 1

## 2014-01-20 MED ORDER — PANTOPRAZOLE SODIUM 40 MG PO PACK
40.0000 mg | PACK | Freq: Every day | ORAL | Status: DC
  Administered 2014-01-20 – 2014-01-21 (×2): 40 mg
  Filled 2014-01-20 (×2): qty 20

## 2014-01-20 NOTE — Progress Notes (Signed)
PT Cancellation Note  Patient Details Name: [name] MRN: [id number] DOB: 02/26/1941   Cancelled Treatment:    Reason Eval/Treat Not Completed: Patient not medically ready   Will sign off due to medical status. Please re consult when medically appropriate.    Duncan Dull 01/20/2014, 12:19 PM Alben Deeds, [place] DPT  [phone]

## 2014-01-20 NOTE — Progress Notes (Signed)
Stroke Team Progress Note  HISTORY Noami Bove is an 73 y.o. female who reports that on Friday night 01/15/2014 she began to notice some right upper extremity weakness and numbness. The patient had fallen earlier and had weakness since that time, the numbness would come and go. That is not particularly unusual for her since she has weakness at times related to her other medical problems. She went to her PCP today and a carotid doppler was performed. She had significant carotid disease noted and was sent to the ED. While being evaluated in the ED the patient became poorly responsive with left eye deviation and right upper and lower extremity weakness. Code stroke was called at that time. last known well 01/15/2014, time unknown. Patient was not administered TPA secondary to delay in arrival. She was sent to intervention where she received IA integrelin as well as partial recanalization of R prox MCA, with R ICA stent placement. Post CT with hemorrhage vs contrast stain. she was loaded with aspirin and plavix.  She was admitted to the neuro ICU for further evaluation and treatment.  SUBJECTIVE Family at the bedside. Asking appropriate questions.  OBJECTIVE Most recent Vital Signs: Filed Vitals:   01/20/14 0600 01/20/14 0700 01/20/14 0800 01/20/14 0840  BP: 108/37 102/39 116/53 126/62  Pulse: 73 77  72  Temp:   99.5 F (37.5 C)   TempSrc:   Oral   Resp: 18 20  20   Height:      Weight:      SpO2: 100% 100%  99%   CBG (last 3)   Recent Labs  01/20/14 0020 01/20/14 0426 01/20/14 0804  GLUCAP 77 93 103*    IV Fluid Intake:   . feeding supplement (VITAL AF 1.2 CAL)    . niCARDipine Stopped (01/17/2014 2300)  . propofol Stopped (01/20/14 0800)  . sodium chloride (hypertonic) 75 mL/hr at 01/20/14 0800    MEDICATIONS  . antiseptic oral rinse  15 mL Mouth Rinse QID  . aspirin  300 mg Rectal Daily   Or  . aspirin  325 mg Oral Daily  . chlorhexidine  15 mL Mouth Rinse BID  .  clopidogrel  75 mg Oral Q breakfast  . famotidine  20 mg Oral QHS  . ipratropium-albuterol  3 mL Nebulization Q6H  . pantoprazole (PROTONIX) IV  40 mg Intravenous Q24H   PRN:  acetaminophen, acetaminophen, albuterol, fentaNYL, ondansetron (ZOFRAN) IV, senna-docusate  Diet:  NPO  Activity:  Bedrest DVT Prophylaxis:  SCDs   CLINICALLY SIGNIFICANT STUDIES Basic Metabolic Panel:   Recent Labs Lab 01/19/14 0330  01/19/14 2249 01/20/14 0500  NA 141  < > 146 151*  K 3.5*  --   --  3.4*  CL 107  --   --  118*  CO2 19  --   --  19  GLUCOSE 113*  --   --  105*  BUN 9  --   --  9  CREATININE 0.78  --   --  0.70  CALCIUM 7.0*  --   --  7.2*  < > = values in this interval not displayed. Liver Function Tests:   Recent Labs Lab 01/31/2014 1259  AST 16  ALT 13  ALKPHOS 91  BILITOT 0.4  PROT 6.4  ALBUMIN 3.5   CBC:   Recent Labs Lab 01/13/2014 1259  01/19/14 0330 01/20/14 0500  WBC 7.2  --  6.1 4.9  NEUTROABS 5.0  --  4.8  --   HGB  12.4  < > 9.3* 7.9*  HCT 37.6  < > 27.4* 23.9*  MCV 87.0  --  87.5 89.2  PLT 221  --  164 148*  < > = values in this interval not displayed. Coagulation:   Recent Labs Lab 01/24/2014 1259  LABPROT 13.2  INR 1.02   Cardiac Enzymes: No results found for this basename: CKTOTAL, CKMB, CKMBINDEX, TROPONINI,  in the last 168 hours Urinalysis:   Recent Labs Lab 01/15/2014 1345  COLORURINE YELLOW  LABSPEC 1.026  PHURINE 6.0  GLUCOSEU NEGATIVE  HGBUR NEGATIVE  BILIRUBINUR NEGATIVE  KETONESUR NEGATIVE  PROTEINUR NEGATIVE  UROBILINOGEN 0.2  NITRITE NEGATIVE  LEUKOCYTESUR SMALL*   Lipid Panel    Component Value Date/Time   CHOL 89 01/19/2014 0330   TRIG 264* 01/19/2014 0330   HDL 19* 01/19/2014 0330   CHOLHDL 4.7 01/19/2014 0330   VLDL 53* 01/19/2014 0330   LDLCALC 17 01/19/2014 0330   HgbA1C  Lab Results  Component Value Date   HGBA1C 5.4 01/19/2014    Urine Drug Screen:   No results found for this basename: labopia,  cocainscrnur,  labbenz,   amphetmu,  thcu,  labbarb    Alcohol Level: No results found for this basename: ETH,  in the last 168 hours   CT of the brain   01/19/2014    Interval development of large territory acute infarct in the left MCA territory with edema and mass effect. No acute hemorrhage.   01/21/2014    Evidence of developing infarction in the left middle cerebral artery territory. No mass effect or shift at this time. Contrast staining in the insular brain and left caudate. Probable contrast staining within what likely represents residual thrombus in the left middle cerebral artery.    01/15/2014    Interval development of dense left middle cerebral artery/ left carotid terminus consistent with thrombus which may indicate impending large left middle cerebral artery distribution infarct.  No intracranial hemorrhage.    Cerebral Angiogram 01/17/2014 S/P bilateral common carotid arteriogram,followed by superselective infusion of 18 mg of IA Integrelin,and 3 passes with Solitaire FR retrieval device with minimal recanalization of occluded LT MCA prox and stent assisted angioplasty of symptomatic LT ICA prox stenosis.  MRI/MRA of the brain  Rectal stimulator precludes  2D Echocardiogram  EF 50-55% with no source of embolus.   Carotid Doppler  See angio  CXR   01/20/2014 Tubes and lines as described. Minimal left basilar atelectasis. 01/19/2014   Left internal jugular line terminating at low SVC; no pneumothorax.  Slight increase in left base Airspace disease, likely atelectasis.  Probable small left pleural effusion.   01/19/2014    1. Line and tube position stable. 2. Bibasilar atelectasis and/or infiltrates. 3. Bronchiectasis in the right lung base cannot be excluded. 4. Cardiomegaly with mild pulmonary venous congestion.        Endotracheal tube tip measures 3.1 cm above the carina. Borderline heart size. No evidence of consolidation or edema.     EKG  normal sinus rhythm. For complete results please see formal  report.   Therapy Recommendations   Physical Exam   Elderly Caucasian lady intubated.    Afebrile. Head is nontraumatic. Neck is supple without bruit.  . Cardiac exam no murmur or gallop. Lungs are clear to auscultation. Distal pulses are well felt.. Neurological Exam : stuporose with eyes closed. Will not open eyes to sternal rub but will move left side purposefully against gravity. Left gaze preference. Will not move  eyes to the right even with doll's eye movements. Pupils 3 mm irregular but reactive. Fundi could not be visualized. Does not blink to threat bilaterally. Right lower face weakness. Tongue midline. Dense right hemiplegia with mild withdrawal in the upper and lower extremity to pain and extensor posturing. Purposeful antigravity movements on the left side. Right plantar is upgoing and left is equivocal. Will not follow any commands  ASSESSMENT [name] is a 73 y.o. female who developed right facial numbness, blurred vision and expressive aphasia during a carotid doppler. She continued to worsen after arrival to the ED. She was taken to intervention where she received angioplasty of proximal LT ICA stenosis with eventual stent placement followed by IA integrilin and angioplasty of proximal LT ICA stenosis with minimal recanalization. Post procedure CT with left MCA infarct, contrast staining. 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 Started on 3% saline to decrease/prevent cerebral edema. Infarct felt to be thrombotic secondary to large vessel disease R ICA stenosis and R MCA stenosis.  On aspirin 81 mg orally every day prior to admission. Now on clopidogrel 75 mg orally every day and aspirin 300 mg suppository following plavix load post stent placement for secondary stroke prevention. Patient with resultant VDRF, right hemiplegia, nonverbal, global aphasia, with right sided reflex movements.    Acute respiratory failure, intubated for neuro intervention Acute blood loss anemia, Hgb 7.5->7.9, transfused 1u PRBCs Induced hypernatremia with 3% saline in order to decrease/reverese cerebral edema, Na 151  Hypertension Hyperlipidemia, LDL 17, on lipitor 80 mg daily PTA, now on no statin, goal LDL < 100  Tobacco abuse, quit 2009 CAD Takosubo cardiomyopahty Carotid artery disease, 40-59% bilat since 2010 Known dysphagia COPD Colonic AVM  depression  Hx PE, post op, treated with course of coumadin  RA  Low back pain, has rectal simulator due to nerve damage from back surgery. Is not compatible with MRI  Dr. Leonie Man discussed diagnosis, prognosis,  treatment options and plan of care with family - expected quality of life is limited. Aggressive plan of care as well as end-of life issues addressed. Family is tearful. For now, the want to continue ongoing aggressive care. They are asking when they need to make the next care decision. (anticipate trach and Peg would be next aggressive care decision). They are currently searching the patients things for her living will.   Hospital day # 2  TREATMENT/PLAN  Continue aggressive care at this time  Continue clopidogrel 75 mg orally every day and aspirin 300 mg suppository for secondary stroke prevention.  lovenox for VTE prophylaxis  Resume statin; consider additional home medications  F/u CT head in am  Discussed with Dr. Elsworth Soho and daughters and answered questions  SIGNED Burnetta Sabin, MSN, RN, ANVP-BC, ANP-BC, GNP-BC [facility] Pager: [phone] 01/20/2014 8:47 AM This patient is critically ill and at significant risk of neurological worsening, death and care requires constant monitoring of vital signs, hemodynamics,respiratory and cardiac monitoring,review of multiple databases, neurological assessment, discussion with family, other specialists and medical decision making of high complexity. I spent 35 minutes of  neurocritical care time  in the care of  this patient.  I have personally obtained a history, examined the patient, evaluated imaging results, and formulated the assessment and plan of care. I agree with the above.  Antony Contras, MD   To contact Stroke Continuity provider, please refer to [email or web address] After hours,  contact General Neurology

## 2014-01-20 NOTE — Progress Notes (Signed)
Dr. Leonel Ramsay said it was OK to change her SBP goal to <160.

## 2014-01-20 NOTE — ED Provider Notes (Signed)
Medical screening examination/treatment/procedure(s) were conducted as a shared visit with non-physician practitioner(s) and myself.  I personally evaluated the patient during the encounter.   EKG Interpretation None       Leota Jacobsen, MD 01/20/14 1132

## 2014-01-20 NOTE — Progress Notes (Signed)
SLP Cancellation Note  Patient Details Name: Sherlie Boyum MRN: [id number] DOB: 1940/10/02   Cancelled treatment:       Reason Eval/Treat Not Completed: Medical issues which prohibited therapy. Will sign off due to medical status. Please reorder if appropriate. Thank you.    Germain Osgood, M.A. CCC-SLP [phone]  Germain Osgood 01/20/2014, 2:33 PM

## 2014-01-20 NOTE — Progress Notes (Signed)
2 Days Post-Op  Subjective: CVA IA integrilin and L MCA pta/stent 6/8 On vent Responds to pain  Objective: Vital signs in last 24 hours: Temp:  [98.9 F (37.2 C)-100.5 F (38.1 C)] 99.5 F (37.5 C) (06/10 0800) Pulse Rate:  [73-119] 77 (06/10 0700) Resp:  [12-29] 20 (06/10 0700) BP: (71-143)/(28-71) 116/53 mmHg (06/10 0800) SpO2:  [95 %-100 %] 100 % (06/10 0700) Arterial Line BP: (71-169)/(38-65) 121/53 mmHg (06/10 0700) FiO2 (%):  [30 %-40 %] 40 % (06/10 0600) Weight:  [79.7 kg (175 lb 11.3 oz)-83.4 kg (183 lb 13.8 oz)] 83.4 kg (183 lb 13.8 oz) (06/10 0429)    Intake/Output from previous day: 06/09 0701 - 06/10 0700 In: 2411.5 [I.V.:1861.5; NG/GT:550] Out: 940 [Urine:940] Intake/Output this shift: Total I/O In: 153.8 [I.V.:83.8; NG/GT:70] Out: 65 [Urine:65]  PE:  Low grade temp On vent Withdraws to pain B Rt groin no bleeding; no hematoma Rt foot 2+ pulse CT Head 6/9: interval development of large L MCA territory infarct with edema and mass effect.  Lab Results:   Recent Labs  01/19/14 0330 01/20/14 0500  WBC 6.1 4.9  HGB 9.3* 7.9*  HCT 27.4* 23.9*  PLT 164 148*   BMET  Recent Labs  01/19/14 0330  01/19/14 2249 01/20/14 0500  NA 141  < > 146 151*  K 3.5*  --   --  3.4*  CL 107  --   --  118*  CO2 19  --   --  19  GLUCOSE 113*  --   --  105*  BUN 9  --   --  9  CREATININE 0.78  --   --  0.70  CALCIUM 7.0*  --   --  7.2*  < > = values in this interval not displayed. PT/INR  Recent Labs  02/04/2014 1259  LABPROT 13.2  INR 1.02   ABG  Recent Labs  02/02/2014 2318  PHART 7.305*  HCO3 21.3    Studies/Results: Ct Head Wo Contrast  01/19/2014   CLINICAL DATA:  Stroke.  Post intervention.  EXAM: CT HEAD WITHOUT CONTRAST  TECHNIQUE: Contiguous axial images were obtained from the base of the skull through the vertex without intravenous contrast.  COMPARISON:  CT head 02/09/2014  FINDINGS: Interval development of large left MCA infarct. There is  extensive low-density edema involving the left frontal, temporal, and parietal lobes. There is also hypodensity in the left basal ganglia involving the caudate and putamen consistent with acute infarct. There is diffuse edema and mass-effect with 6 mm midline shift to the right. Dense left MCA again noted due to thrombosis.  Negative for acute hemorrhage. Negative for hydrocephalus. No mass lesion.  IMPRESSION: Interval development of large territory acute infarct in the left MCA territory with edema and mass effect. No acute hemorrhage.   Electronically Signed   By: Franchot Gallo M.D.   On: 01/19/2014 16:47   Ct Head Wo Contrast  01/15/2014   CLINICAL DATA:  Followup a thrombectomy  EXAM: CT HEAD WITHOUT CONTRAST  TECHNIQUE: Contiguous axial images were obtained from the base of the skull through the vertex without intravenous contrast.  COMPARISON:  Earlier same day  FINDINGS: The patient has recently had thrombectomy in the left middle cerebral artery. The brainstem and cerebellum continue have a normal appearance. No abnormality seen in the right hemisphere. On the left, there is loss of gray-white differentiation in the insular region in the frontoparietal brain consistent with developing infarction in the left middle cerebral  artery territory. There is contrast staining in the insula and in the left caudate. There is probably some contrast trapped within residual thrombus in the middle cerebral artery. No sign of subarachnoid hemorrhage. No hydrocephalus or extra-axial collection.  IMPRESSION: Evidence of developing infarction in the left middle cerebral artery territory. No mass effect or shift at this time. Contrast staining in the insular brain and left caudate. Probable contrast staining within what likely represents residual thrombus in the left middle cerebral artery.   Electronically Signed   By: Nelson Chimes M.D.   On: 01/20/2014 20:44   Ct Head (brain) Wo Contrast  01/17/2014   CLINICAL DATA:   Unresponsive.  Right-sided is flaccid.  EXAM: CT HEAD WITHOUT CONTRAST  TECHNIQUE: Contiguous axial images were obtained from the base of the skull through the vertex without intravenous contrast.  COMPARISON:  None.  FINDINGS: Interval development of dense left middle cerebral artery/ left carotid terminus consistent with thrombus which may indicate impending large left middle cerebral artery distribution infarct.  No intracranial hemorrhage.  No intracranial mass lesion noted on this unenhanced exam.  No hydrocephalus.  IMPRESSION: Interval development of dense left middle cerebral artery/ left carotid terminus consistent with thrombus which may indicate impending large left middle cerebral artery distribution infarct.  No intracranial hemorrhage.  These results were called by telephone at the time of interpretation on 01/30/2014 at 1:00 PM to Dr. Doy Mince who verbally acknowledged these results.   Electronically Signed   By: Chauncey Cruel M.D.   On: 02/04/2014 13:07   Dg Chest Port 1 View  01/20/2014   CLINICAL DATA:  Check endotracheal tube position  EXAM: PORTABLE CHEST - 1 VIEW  COMPARISON:  01/19/2014  FINDINGS: The cardiac shadow is stable. An endotracheal tube is again noted 3.7 cm above the carina. A left-sided jugular central line is noted with in the mid superior vena cava. Nasogastric catheter extends to the stomach. The lungs are well aerated bilaterally. Very minimal residual left basilar atelectasis is noted. No pneumothorax is seen.  IMPRESSION: Tubes and lines as described.  Minimal left basilar atelectasis.   Electronically Signed   By: Inez Catalina M.D.   On: 01/20/2014 07:29   Dg Chest Port 1 View  01/19/2014   CLINICAL DATA:  Central line placement.  CHF.  Hypertension.  EXAM: PORTABLE CHEST - 1 VIEW  COMPARISON:  Earlier today at 0532 hr.  FINDINGS: 1941 hr. Endotracheal tube terminates 3.0 cm above carina. Left internal jugular line terminates at the low SVC.  Nasogastric tube extends  beyond the  inferior aspect of the film.  Cardiomegaly accentuated by AP portable technique. Possible small left pleural effusion. No pneumothorax. No congestive failure. Slight worsened left base airspace disease with persistent right base atelectasis.  IMPRESSION: Left internal jugular line terminating at low SVC; no pneumothorax.  Slight increase in left base Airspace disease, likely atelectasis.  Probable small left pleural effusion.  Cardiomegaly and low lung volumes.   Electronically Signed   By: Abigail Miyamoto M.D.   On: 01/19/2014 20:00   Dg Chest Port 1 View  01/19/2014   CLINICAL DATA:  Intubation.  EXAM: PORTABLE CHEST - 1 VIEW  COMPARISON:  02/03/2014.  FINDINGS: Endotracheal tube and NG tube in good anatomic position. Cardiomegaly. Mild pulmonary vascular prominence. Bibasilar atelectasis and/or infiltrates are present. A component of bronchiectasis in the right lung base cannot be excluded. No pleural effusion or pneumothorax. No acute osseus abnormality. Prior cervical spine fusion.  IMPRESSION: 1.  Line and tube position stable. 2. Bibasilar atelectasis and/or infiltrates. 3. Bronchiectasis in the right lung base cannot be excluded. 4. Cardiomegaly with mild pulmonary venous congestion.   Electronically Signed   By: Marcello Moores  Register   On: 01/19/2014 07:18   Dg Chest Port 1 View  01/25/2014   CLINICAL DATA:  Stroke.  Endotracheal tube placement.  EXAM: PORTABLE CHEST - 1 VIEW  COMPARISON:  09/04/2013  FINDINGS: Endotracheal tube placement with tip measuring 3.1 cm above the carinal. Enteric tube tip is been placed with tip in the right upper quadrant consistent with location in the distal stomach. Shallow inspiration. Borderline heart size with normal pulmonary vascularity. No developing airspace disease or consolidation. Probable mild atelectasis in the lung bases. No pneumothorax. Postoperative changes in the cervical spine.  IMPRESSION: Endotracheal tube tip measures 3.1 cm above the carina.  Borderline heart size. No evidence of consolidation or edema.   Electronically Signed   By: Lucienne Capers M.D.   On: 02/03/2014 21:44    Anti-infectives: Anti-infectives   None      Assessment/Plan: s/p Procedure(s): RADIOLOGY WITH ANESTHESIA (N/A)  CVA/ L MCA recanalization with IA integrilin and pta/stent 6/8 Will follow Plan per stroke team   LOS: 2 days    Vearl Allbaugh A 01/20/2014

## 2014-01-20 NOTE — Progress Notes (Signed)
PULMONARY / CRITICAL CARE MEDICINE  Name: [name] MRN: [id number] DOB: 02/06/1941    ADMISSION DATE:  01/24/2014 CONSULTATION DATE:    Loistine Chance MD :  Leonie Man PRIMARY SERVICE:  PCCM  CHIEF COMPLAINT:  CVA  BRIEF PATIENT DESCRIPTION: 73 y.o. F with hx of significant carotid artery stenosis, went to office for routine carotid dopplers then became flaccid on right with left facial gaze.  Sent to ED for further eval, found to have acute left MCA.  Taken to IR for angioplasty, returned to ICU on vent.  PCCM was consulted.  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 Hx of CAD and an improved Takotsubo CM (last EF 50%). Hx remote PE '88.    SIGNIFICANT EVENTS / STUDIES:  6/8 - outpatient appt for carotid dopplers, developed CVA symptoms, taken to ED and found to have acute left MCA stroke. 6/8 Head CT >>> impending large left MCA infarct >>> developing infarct in left MCA, no ME / MLS. 6/8 IR procedure >>> s/p bilateral common carotid arteriogram, infusion of integrelin, minimal recanalization of occluded left MCA and stent assisted angioplasty of symptomatic left ICA proximal stenosis. 6/8 echo >>>nml EF, no source of emboli 6/9 head CT >>>large territory acute infarct in the left MCA territory with edema and mass effect, 62mm shift   LINES / TUBES: OETT 6/8 >>> OGT 6/8 >>> Foley 6/8 >>> Left radial A-line 6/8 >>>  CULTURES: None  ANTIBIOTICS: None    SUBJECTIVE:  Low frade fever unresponsive Bp ok   VITAL SIGNS: Temp:  [98.9 F (37.2 C)-100.5 F (38.1 C)] 99.5 F (37.5 C) (06/10 0800) Pulse Rate:  [73-119] 77 (06/10 0700) Resp:  [12-29] 20 (06/10 0700) BP: (71-143)/(28-71) 116/53 mmHg (06/10 0800) SpO2:  [95 %-100 %] 100 % (06/10 0700) Arterial Line BP: (71-169)/(38-65) 121/53 mmHg (06/10 0700) FiO2 (%):  [30 %-40 %] 40 % (06/10 0600) Weight:  [79.7 kg (175 lb 11.3 oz)-83.4 kg (183 lb 13.8  oz)] 83.4 kg (183 lb 13.8 oz) (06/10 0429)  HEMODYNAMICS:    VENTILATOR SETTINGS: Vent Mode:  [-] PRVC FiO2 (%):  [30 %-40 %] 40 % Set Rate:  [15 bmp] 15 bmp Vt Set:  [450 mL] 450 mL PEEP:  [5 cmH20] 5 cmH20 Pressure Support:  [5 cmH20-8 cmH20] 8 cmH20 Plateau Pressure:  [11 cmH20-19 cmH20] 15 cmH20  INTAKE / OUTPUT: Intake/Output     06/09 0701 - 06/10 0700 06/10 0701 - 06/11 0700   I.V. (mL/kg) 1861.5 (22.3) 83.8 (1)   Blood     NG/GT 550 70   IV Piggyback     Total Intake(mL/kg) 2411.5 (28.9) 153.8 (1.8)   Urine (mL/kg/hr) 940 (0.5) 65 (0.5)   Blood     Total Output 940 65   Net +1471.5 +88.8          PHYSICAL EXAMINATION: General: in bed on vent, in NAD. Neuro:  R hemiparesis, decerebrate on right, moves left to stimuli, plantar upgoing on right HEENT: Lydia/AT. PERRL, sclerae anicteric. Cardiovascular: RRR, no M/R/G.  Lungs: Respirations even and unlabored.  CTA bilaterally, No W/R/R.   Abdomen: BS x 4, soft, NT/ND.  Musculoskeletal: No gross deformities, no edema.  Skin: Intact, warm, no rashes.    LABS: CBC  Recent Labs Lab 01/30/2014 1259 01/30/2014 1310 01/19/14 0330 01/20/14 0500  WBC 7.2  --  6.1 4.9  HGB 12.4 12.6 9.3* 7.9*  HCT 37.6 37.0 27.4* 23.9*  PLT 221  --  164 148*   Coag's  Recent Labs Lab 02/05/2014 1259  APTT 27  INR 1.02   BMET  Recent Labs Lab 01/13/2014 1259 02/09/2014 1310 01/19/14 0330 01/19/14 1755 01/19/14 2249 01/20/14 0500  NA 144 142 141 143 146 151*  K 4.2 3.9 3.5*  --   --  3.4*  CL 105 101 107  --   --  118*  CO2 26  --  19  --   --  19  BUN 14 12 9   --   --  9  CREATININE 0.89 1.00 0.78  --   --  0.70  GLUCOSE 94 92 113*  --   --  105*   Electrolytes  Recent Labs Lab 01/19/2014 1259 01/19/14 0330 01/20/14 0500  CALCIUM 9.1 7.0* 7.2*   Sepsis Markers No results found for this basename: LATICACIDVEN, PROCALCITON, O2SATVEN,  in the last 168 hours ABG  Recent Labs Lab 01/17/2014 2318  PHART 7.305*   PCO2ART 42.6  PO2ART 411.0*   Liver Enzymes  Recent Labs Lab 02/01/2014 1259  AST 16  ALT 13  ALKPHOS 91  BILITOT 0.4  ALBUMIN 3.5   Cardiac Enzymes No results found for this basename: TROPONINI, PROBNP,  in the last 168 hours Glucose  Recent Labs Lab 01/19/14 1138 01/19/14 1542 01/19/14 2004 01/20/14 0020 01/20/14 0426 01/20/14 0804  GLUCAP 101* 105* 89 77 93 103*    IMAGING: Ct Head Wo Contrast  01/19/2014   CLINICAL DATA:  Stroke.  Post intervention.  EXAM: CT HEAD WITHOUT CONTRAST  TECHNIQUE: Contiguous axial images were obtained from the base of the skull through the vertex without intravenous contrast.  COMPARISON:  CT head 02/04/2014  FINDINGS: Interval development of large left MCA infarct. There is extensive low-density edema involving the left frontal, temporal, and parietal lobes. There is also hypodensity in the left basal ganglia involving the caudate and putamen consistent with acute infarct. There is diffuse edema and mass-effect with 6 mm midline shift to the right. Dense left MCA again noted due to thrombosis.  Negative for acute hemorrhage. Negative for hydrocephalus. No mass lesion.  IMPRESSION: Interval development of large territory acute infarct in the left MCA territory with edema and mass effect. No acute hemorrhage.   Electronically Signed   By: Franchot Gallo M.D.   On: 01/19/2014 16:47   Ct Head Wo Contrast  01/29/2014   CLINICAL DATA:  Followup a thrombectomy  EXAM: CT HEAD WITHOUT CONTRAST  TECHNIQUE: Contiguous axial images were obtained from the base of the skull through the vertex without intravenous contrast.  COMPARISON:  Earlier same day  FINDINGS: The patient has recently had thrombectomy in the left middle cerebral artery. The brainstem and cerebellum continue have a normal appearance. No abnormality seen in the right hemisphere. On the left, there is loss of gray-white differentiation in the insular region in the frontoparietal brain consistent  with developing infarction in the left middle cerebral artery territory. There is contrast staining in the insula and in the left caudate. There is probably some contrast trapped within residual thrombus in the middle cerebral artery. No sign of subarachnoid hemorrhage. No hydrocephalus or extra-axial collection.  IMPRESSION: Evidence of developing infarction in the left middle cerebral artery territory. No mass effect or shift at this time. Contrast staining in the insular brain and left caudate. Probable contrast staining within what likely represents residual thrombus in the left middle cerebral artery.   Electronically Signed   By:  Nelson Chimes M.D.   On: 02/05/2014 20:44   Ct Head (brain) Wo Contrast  02/09/2014   CLINICAL DATA:  Unresponsive.  Right-sided is flaccid.  EXAM: CT HEAD WITHOUT CONTRAST  TECHNIQUE: Contiguous axial images were obtained from the base of the skull through the vertex without intravenous contrast.  COMPARISON:  None.  FINDINGS: Interval development of dense left middle cerebral artery/ left carotid terminus consistent with thrombus which may indicate impending large left middle cerebral artery distribution infarct.  No intracranial hemorrhage.  No intracranial mass lesion noted on this unenhanced exam.  No hydrocephalus.  IMPRESSION: Interval development of dense left middle cerebral artery/ left carotid terminus consistent with thrombus which may indicate impending large left middle cerebral artery distribution infarct.  No intracranial hemorrhage.  These results were called by telephone at the time of interpretation on 01/14/2014 at 1:00 PM to Dr. Doy Mince who verbally acknowledged these results.   Electronically Signed   By: Chauncey Cruel M.D.   On: 01/31/2014 13:07   Dg Chest Port 1 View  01/20/2014   CLINICAL DATA:  Check endotracheal tube position  EXAM: PORTABLE CHEST - 1 VIEW  COMPARISON:  01/19/2014  FINDINGS: The cardiac shadow is stable. An endotracheal tube is again noted  3.7 cm above the carina. A left-sided jugular central line is noted with in the mid superior vena cava. Nasogastric catheter extends to the stomach. The lungs are well aerated bilaterally. Very minimal residual left basilar atelectasis is noted. No pneumothorax is seen.  IMPRESSION: Tubes and lines as described.  Minimal left basilar atelectasis.   Electronically Signed   By: Inez Catalina M.D.   On: 01/20/2014 07:29   Dg Chest Port 1 View  01/19/2014   CLINICAL DATA:  Central line placement.  CHF.  Hypertension.  EXAM: PORTABLE CHEST - 1 VIEW  COMPARISON:  Earlier today at 0532 hr.  FINDINGS: 1941 hr. Endotracheal tube terminates 3.0 cm above carina. Left internal jugular line terminates at the low SVC.  Nasogastric tube extends beyond the  inferior aspect of the film.  Cardiomegaly accentuated by AP portable technique. Possible small left pleural effusion. No pneumothorax. No congestive failure. Slight worsened left base airspace disease with persistent right base atelectasis.  IMPRESSION: Left internal jugular line terminating at low SVC; no pneumothorax.  Slight increase in left base Airspace disease, likely atelectasis.  Probable small left pleural effusion.  Cardiomegaly and low lung volumes.   Electronically Signed   By: Abigail Miyamoto M.D.   On: 01/19/2014 20:00   Dg Chest Port 1 View  01/19/2014   CLINICAL DATA:  Intubation.  EXAM: PORTABLE CHEST - 1 VIEW  COMPARISON:  02/02/2014.  FINDINGS: Endotracheal tube and NG tube in good anatomic position. Cardiomegaly. Mild pulmonary vascular prominence. Bibasilar atelectasis and/or infiltrates are present. A component of bronchiectasis in the right lung base cannot be excluded. No pleural effusion or pneumothorax. No acute osseus abnormality. Prior cervical spine fusion.  IMPRESSION: 1. Line and tube position stable. 2. Bibasilar atelectasis and/or infiltrates. 3. Bronchiectasis in the right lung base cannot be excluded. 4. Cardiomegaly with mild pulmonary venous  congestion.   Electronically Signed   By: Marcello Moores  Register   On: 01/19/2014 07:18   Dg Chest Port 1 View  02/01/2014   CLINICAL DATA:  Stroke.  Endotracheal tube placement.  EXAM: PORTABLE CHEST - 1 VIEW  COMPARISON:  09/04/2013  FINDINGS: Endotracheal tube placement with tip measuring 3.1 cm above the carinal. Enteric tube tip is been  placed with tip in the right upper quadrant consistent with location in the distal stomach. Shallow inspiration. Borderline heart size with normal pulmonary vascularity. No developing airspace disease or consolidation. Probable mild atelectasis in the lung bases. No pneumothorax. Postoperative changes in the cervical spine.  IMPRESSION: Endotracheal tube tip measures 3.1 cm above the carina. Borderline heart size. No evidence of consolidation or edema.   Electronically Signed   By: Lucienne Capers M.D.   On: 01/17/2014 21:44   CXR: NACPD   ASSESSMENT / PLAN:  PULMONARY A:   VDRF -airway protection Hx ILD Hx Tobacco use disorder P:   - Goal SpO2>92. - VAP bundle. - DuoNebs / Albuterol. - SBTs as tolerated but mental status precludes extubation   CARDIOVASCULAR A:  Hx HTN Hx CAD Hx Takotsubo cardiomyopathy Hx carotid artery disease Transient hypotension P:  - Hold home Toprol, Losartan, Furosemide, Imdur. - restart as Bp rises - ASA/ Plavix per neuro.   RENAL A:  Hypokalemia P:  replete -3% saline  - BMP in AM.  GASTROINTESTINAL A:   Hx GERD Hx IBS P:   -ctTF  - Continue Pantoprazole / Zantac.  HEMATOLOGIC A:   Anemia post procedure (Hgb 7.5)-s/p 1 unit PRBC P:   - VTE Proph:  SCD's.   INFECTIOUS A:   No acute issues. P:   - Monitor for fever / leukocytosis.  ENDOCRINE  A:   No acute issues. P:   - Monitor glucose on BMP.  NEUROLOGIC A:   Acute CVA - s/p bilateral common carotid arteriogram, infusion of integrelin, minimal recanalization of occluded left MCA and stent assisted angioplasty of symptomatic left ICA  proximal stenosis. Acute encephalopathy P:   - Sedation:  fent int +Propofol. - Goal RASS 0  - Daily WUA.   MUSCULOSKELETAL A: Rheumatoid Arthritis Hx myositis P: - Hold home leflunomide for now.  Summary - Mental status main barrier to extubation here  I have personally obtained history, examined patient, evaluated and interpreted laboratory and imaging results, reviewed medical records, formulated assessment / plan and placed orders.  CRITICAL CARE:  The patient is critically ill with multiple organ systems failure and requires high complexity decision making for assessment and support, frequent evaluation and titration of therapies, application of advanced monitoring technologies and extensive interpretation of multiple databases. Critical Care Time devoted to patient care services described in this note is 32 minutes.   Kara Mead MD. Shade Flood. [facility] Pager [phone] If no response call [phone]

## 2014-01-20 NOTE — Progress Notes (Signed)
OT Cancellation Note  Patient Details Name: [name] MRN: [id number] DOB: 1941/03/03   Cancelled Treatment:    Reason Eval/Treat Not Completed: Patient not medically ready Will sign off due to medical status. Please reorder if appropriate. Thank you.  [place], OTR/L  [phone] 01/20/2014 01/20/2014, 10:52 AM

## 2014-01-20 NOTE — Progress Notes (Signed)
Na+ 156, Dr. Nicole Kindred notified and ordered 3% Na to be decreased to 30 mL/hr.

## 2014-01-21 ENCOUNTER — Inpatient Hospital Stay (HOSPITAL_COMMUNITY): Payer: Medicare Other

## 2014-01-21 DIAGNOSIS — J810 Acute pulmonary edema: Secondary | ICD-10-CM

## 2014-01-21 LAB — PRO B NATRIURETIC PEPTIDE: Pro B Natriuretic peptide (BNP): 3957 pg/mL — ABNORMAL HIGH (ref 0–125)

## 2014-01-21 LAB — GLUCOSE, CAPILLARY
GLUCOSE-CAPILLARY: 116 mg/dL — AB (ref 70–99)
GLUCOSE-CAPILLARY: 124 mg/dL — AB (ref 70–99)
Glucose-Capillary: 121 mg/dL — ABNORMAL HIGH (ref 70–99)
Glucose-Capillary: 133 mg/dL — ABNORMAL HIGH (ref 70–99)

## 2014-01-21 LAB — CBC
HEMATOCRIT: 23.7 % — AB (ref 36.0–46.0)
Hemoglobin: 7.6 g/dL — ABNORMAL LOW (ref 12.0–15.0)
MCH: 28.8 pg (ref 26.0–34.0)
MCHC: 32.1 g/dL (ref 30.0–36.0)
MCV: 89.8 fL (ref 78.0–100.0)
Platelets: 162 10*3/uL (ref 150–400)
RBC: 2.64 MIL/uL — AB (ref 3.87–5.11)
RDW: 16.3 % — ABNORMAL HIGH (ref 11.5–15.5)
WBC: 6.6 10*3/uL (ref 4.0–10.5)

## 2014-01-21 LAB — BASIC METABOLIC PANEL
BUN: 13 mg/dL (ref 6–23)
CHLORIDE: 123 meq/L — AB (ref 96–112)
CO2: 21 mEq/L (ref 19–32)
Calcium: 7.9 mg/dL — ABNORMAL LOW (ref 8.4–10.5)
Creatinine, Ser: 0.77 mg/dL (ref 0.50–1.10)
GFR calc non Af Amer: 83 mL/min — ABNORMAL LOW (ref >90)
Glucose, Bld: 140 mg/dL — ABNORMAL HIGH (ref 70–99)
Potassium: 3.1 mEq/L — ABNORMAL LOW (ref 3.7–5.3)
Sodium: 159 mEq/L — ABNORMAL HIGH (ref 137–147)

## 2014-01-21 LAB — TROPONIN I: Troponin I: <0.3 ng/mL (ref <0.30)

## 2014-01-21 LAB — LACTIC ACID, PLASMA: Lactic Acid, Venous: 0.6 mmol/L (ref 0.5–2.2)

## 2014-01-21 MED ORDER — MORPHINE SULFATE 10 MG/ML IJ SOLN
10.0000 mg/h | INTRAVENOUS | Status: DC
  Administered 2014-01-21: 14 mg/h via INTRAVENOUS
  Administered 2014-01-21: 10 mg/h via INTRAVENOUS
  Administered 2014-01-21: 18 mg/h via INTRAVENOUS
  Administered 2014-01-21: 10 mg/h via INTRAVENOUS
  Administered 2014-01-21: 18 mg/h via INTRAVENOUS
  Administered 2014-01-21: 14 mg/h via INTRAVENOUS
  Administered 2014-01-21 (×2): 10 mg/h via INTRAVENOUS
  Administered 2014-01-21: 18 mg/h via INTRAVENOUS
  Administered 2014-01-21: 27.5 mg/h via INTRAVENOUS
  Filled 2014-01-21 (×4): qty 10

## 2014-01-21 MED ORDER — SCOPOLAMINE 1 MG/3DAYS TD PT72
1.0000 | MEDICATED_PATCH | TRANSDERMAL | Status: DC
  Administered 2014-01-21: 1.5 mg via TRANSDERMAL
  Filled 2014-01-21: qty 1

## 2014-01-21 MED ORDER — FUROSEMIDE 10 MG/ML IJ SOLN
40.0000 mg | INTRAMUSCULAR | Status: AC
  Administered 2014-01-21: 40 mg via INTRAVENOUS
  Filled 2014-01-21: qty 4

## 2014-01-21 MED ORDER — SODIUM CHLORIDE 0.9 % IV SOLN
3.0000 g | Freq: Three times a day (TID) | INTRAVENOUS | Status: DC
  Administered 2014-01-21: 3 g via INTRAVENOUS
  Filled 2014-01-21 (×3): qty 3

## 2014-01-21 MED ORDER — POTASSIUM CHLORIDE 20 MEQ/15ML (10%) PO LIQD
30.0000 meq | ORAL | Status: AC
  Administered 2014-01-21 (×2): 30 meq
  Filled 2014-01-21 (×2): qty 22.5

## 2014-01-21 NOTE — Progress Notes (Signed)
**Note De-Identified  Obfuscation** Rt note: sputum collected and sent to lab.

## 2014-01-21 NOTE — Progress Notes (Signed)
eLink Physician-Brief Progress Note Patient Name: [name] [facility] DOB: 12/10/1940 MRN: [id number]  Date of Service  01/21/2014   HPI/Events of Note    Suddent onset of acute pulm edema (worsening cxr, pink sputum, high bp, and worsening hypoxemia in setting of prior 3% saline)  eICU Interventions  Lasix 40mg  IV x1 Trop x 3 Check bnp Check lactate   Intervention Category Major Interventions: Respiratory failure - evaluation and management  Elizeo Rodriques 01/21/2014, 6:49 AM

## 2014-01-21 NOTE — Progress Notes (Signed)
Patient had episode of increased frothy secretions, tachycardia 130-140s and tachypnea. 50 mcg of fentanyl given with little improvement in heart rate. ELink notified. Thayer Ohm D

## 2014-01-21 NOTE — Progress Notes (Signed)
Patient's secretions became more frothy and pink-tinged. Crackles and wheezes bilaterally. ELink notified. Thayer Ohm D

## 2014-01-21 NOTE — Progress Notes (Signed)
Living will and health care power of attorney placed in chart.

## 2014-01-21 NOTE — Progress Notes (Signed)
01/21/14 0730  Vitals  BP ! 200/102 mmHg  MAP (mmHg) 120  Pulse Rate ! 125  ECG Heart Rate ! 126  Resp ! 27  Oxygen Therapy  SpO2 100 %    Pt's blood pressure is in the systolic in the 361'W and HR in the 120's. Pt continues to have copious pink frothy secretions, suctioned patient. Pt's heart rate increased to 238 and then decreased to HR of 200's. Notified Dr. Elsworth Soho, no new orders received at this time. Will continue to monitor.    01/21/14 0745  Vitals  BP ! 180/81 mmHg  MAP (mmHg) 99  Pulse Rate ! 29  ECG Heart Rate ! 203  Resp ! 27  Oxygen Therapy  SpO2 100 %

## 2014-01-21 NOTE — Progress Notes (Signed)
Chaplain provided brief prayer to tearful family outside of pt's room. Family asked for prayer for "peace." Chaplain available for follow-up as needed.

## 2014-01-21 NOTE — Progress Notes (Signed)
[place] ICU Electrolyte Replacement Protocol  Patient Name: [name] [facility] DOB: 07/22/1941 MRN: [id number]  Date of Service  01/21/2014   HPI/Events of Note    Recent Labs Lab 01/27/2014 1259 01/13/2014 1310 01/31/2014 1832 01/19/14 0330  01/20/14 0500 01/20/14 1100 01/20/14 1715 01/20/14 2258 01/21/14 0500  NA 144 142 141 141  < > 151* 152* 156* 158* 159*  K 4.2 3.9 3.6* 3.5*  --  3.4*  --   --   --  3.1*  CL 105 101 109 107  --  118*  --   --   --  123*  CO2 26  --   --  19  --  19  --   --   --  21  GLUCOSE 94 92 111* 113*  --  105*  --   --   --  140*  BUN $Re'14 12 8 9  'ITz$ --  9  --   --   --  13  CREATININE 0.89 1.00 0.60 0.78  --  0.70  --   --   --  0.77  CALCIUM 9.1  --   --  7.0*  --  7.2*  --   --   --  7.9*  < > = values in this interval not displayed.  Estimated Creatinine Clearance: 67 ml/min (by C-G formula based on Cr of 0.77).  Intake/Output     06/10 0701 - 06/11 0700   I.V. (mL/kg) 1058.8 (12.7)   NG/GT 1140   Total Intake(mL/kg) 2198.8 (26.3)   Urine (mL/kg/hr) 1615 (0.8)   Total Output 1615   Net +583.8        - I/O DETAILED x24h    Total I/O In: 700 [I.V.:150; NG/GT:550] Out: 900 [Urine:900] - I/O THIS SHIFT    ASSESSMENT   eICURN Interventions  K+3.1  Electrolyte protocol criteria met. Lab values replaced per protocol. MD notified.   ASSESSMENT: MAJOR ELECTROLYTE    [name] 01/21/2014, 6:38 AM

## 2014-01-21 NOTE — Progress Notes (Signed)
Na+ 158. Dr. Nicole Kindred notified and ordered 3% to be turned off.

## 2014-01-21 NOTE — Progress Notes (Signed)
ANTIBIOTIC CONSULT NOTE - INITIAL  Pharmacy Consult for Unasyn Indication: rule out pneumonia  Allergies  Allergen Reactions  . Methotrexate Derivatives Shortness Of Breath and Other (See Comments)    Caused build up of scarring in  Lungs.  Causes problems with elevated liver enzymes.  . Codeine Itching    Causes minor itching, patient can take with benadryl  . Imuran [Azathioprine] Nausea And Vomiting  . Levofloxacin Swelling  . Lunesta [Eszopiclone] Nausea And Vomiting  . Nitrofurantoin Itching    *MACROBID*  Tongue swelling    Patient Measurements: Height: 5\' 3"  (160 cm) Weight: 184 lb 8.4 oz (83.7 kg) IBW/kg (Calculated) : 52.4 Adjusted Body Weight:   Vital Signs: Temp: 100.4 F (38 C) (06/11 0311) Temp src: Axillary (06/11 0311) BP: 125/45 mmHg (06/11 0800) Pulse Rate: 102 (06/11 0800) Intake/Output from previous day: 06/10 0701 - 06/11 0700 In: 2248.8 [I.V.:1058.8; NG/GT:1190] Out: 1615 [Urine:1615] Intake/Output from this shift: Total I/O In: 80 [NG/GT:80] Out: 500 [Urine:500]  Labs:  Recent Labs  01/19/14 0330 01/20/14 0500 01/21/14 0500  WBC 6.1 4.9 6.6  HGB 9.3* 7.9* 7.6*  PLT 164 148* 162  CREATININE 0.78 0.70 0.77   Estimated Creatinine Clearance: 67 ml/min (by C-G formula based on Cr of 0.77). No results found for this basename: VANCOTROUGH, Corlis Leak, VANCORANDOM, [place], GENTPEAK, GENTRANDOM, TOBRATROUGH, TOBRAPEAK, TOBRARND, AMIKACINPEAK, AMIKACINTROU, AMIKACIN,  in the last 72 hours   Microbiology: Recent Results (from the past 720 hour(s))  MRSA PCR SCREENING     Status: None   Collection Time    02/07/2014  8:49 PM      Result Value Ref Range Status   MRSA by PCR NEGATIVE  NEGATIVE Final   Comment:            The GeneXpert MRSA Assay (FDA     approved for NASAL specimens     only), is one component of a     comprehensive MRSA colonization     surveillance program. It is not     intended to diagnose MRSA     infection nor to  guide or     monitor treatment for     MRSA infections.    Medical History: Past Medical History  Diagnosis Date  . Pulmonary embolism     a. 07/2012 CTA chest negative for PE.  Marland Kitchen Rheumatoid arthritis(714.0)   . Hypertension   . Incontinence of feces   . Unspecified disorder of bladder   . Rectal prolapse   . Hiatal hernia   . IBS (irritable bowel syndrome)   . GERD (gastroesophageal reflux disease)   . Pain in limb   . Lumbago     hospital admission with back pain in April, 2011 repair of a disc herniation L4-L5   . Osteoarthrosis, unspecified whether generalized or localized, unspecified site   . COPD (chronic obstructive pulmonary disease)   . Tobacco use disorder   . CAD (coronary artery disease) Non-obstructive    a. Nuclear, May, 2010, no ischemia;  b. cath 2012, nonobstructive CAD ( 40% circumflex, 40% LAD) EF normal;  c. 07/2012 NSTEMI/Cath: nonobs dzs, EF 40% w/ periapical AK.;  d.  Carlton Adam Myoview (11/14): EF 61%, no ischemia, normal study.  . Carotid artery disease     a. Doppler, July, 2010, 40-59% bilateral, b. Dopplers 6/13:  40-59% bilat (f/u  01/2014)  . Adenomatous polyp 02/2008  . AVM (arteriovenous malformation)     Small colonic  . Shortness of breath  Cardiac evaluation, Dr.Katz, pulmonary evaluation, 2012, Dr. Lamonte Sakai.  Marland Kitchen UTI (lower urinary tract infection)     Possible UTI by history June 14, 2011,   . Gout   . Shoulder pain, right   . Spinal stenosis   . Hyperlipidemia   . Glaucoma   . Splenomegaly   . Fatty infiltration of liver   . Cystitis   . Takotsubo cardiomyopathy Presumed    a. 07/2012 NSTEMI/Cath (nonobs dzs)/Echo:EF 45-50%, mid-distal antsept AK w/ Gr 1 DD.;  b.  Echo 09/2012: EF 55%, mild LAE  . Dysphagia     a. s/p prior esophageal dilatation. //  Dysarthria and dysphasia hospitalization December, 2013, neurology workup ongoing  . CHF (congestive heart failure)   . Pneumonia     hx of; has not had since taken pneumonia vaccine  .  Elevated CPK     Been evaluated with muscle biopsy, February, 2014  . Speech abnormality     January, 2014, question small CVA  . Depression   . Inclusion body myositis     Medications:  See EMR  Assessment: 73 year old female with acute L MCA stroke, no tPA given.  Initiating Unasyn for possible aspiration pneumonia.  Pt does not have a white count, has low grade fevers.   Goal of Therapy:  Eradication of infection  Plan:  Unasyn 3 g IV q8h F/u resp. culture F/u narrowing/duration of therapy    Hughes Better, PharmD, BCPS Clinical Pharmacist Pager: [phone] 01/21/2014 9:03 AM

## 2014-01-21 NOTE — Progress Notes (Signed)
Family is ready to transition to comfort care. Notified Burnetta Sabin, NP; orders placed. Will continue to monitor and provide emotional support to family.

## 2014-01-21 NOTE — Progress Notes (Signed)
[place] Progress Note Patient Name: [name] [facility] DOB: [birth year] MRN: [id number]  Date of Service  01/21/2014   HPI/Events of Note   Comfort, family concerned secretions  eICU Interventions  Scopolamine patch   Intervention Category Minor Interventions: Routine modifications to care plan (e.g. PRN medications for pain, fever)  Raylene Miyamoto. 01/21/2014, 5:22 PM

## 2014-01-21 NOTE — Procedures (Signed)
**Note De-Identified Felicity Penix Obfuscation** Extubation Procedure Note  Patient Details:   Name: Genisis Sonnier [facility] DOB: 05/18/1941 MRN: [id number]   Airway Documentation:  Airway 7.5 mm (Active)  Secured at (cm) 21 cm 01/21/2014 12:56 PM  Measured From Lips 01/21/2014 12:56 PM  Miguel Barrera 01/21/2014 12:56 PM  Secured By [employer] 01/21/2014 12:56 PM  Tube Holder Repositioned Yes 01/21/2014 12:56 PM  Cuff Pressure (cm H2O) 25 cm H2O 01/19/2014  3:15 PM  Site Condition Dry 01/21/2014 12:56 PM    Evaluation  O2 sats: stable throughout Complications: No apparent complications Patient did tolerate procedure well. Bilateral Breath Sounds: Coarse crackles;Inspiratory wheezes Suctioning: Oral No Patient extubated per withdrawal of life-sustaining treatment protocol Beau Vanduzer, Penni Bombard 01/21/2014, 3:30 PM

## 2014-01-21 NOTE — Progress Notes (Signed)
Pt transported to and from CT with no complications. 

## 2014-01-21 NOTE — Progress Notes (Signed)
Stroke Team Progress Note  HISTORY [name] is an 73 y.o. female who reports that on Friday night 01/15/2014 she began to notice some right upper extremity weakness and numbness. The patient had fallen earlier and had weakness since that time, the numbness would come and go. That is not particularly unusual for her since she has weakness at times related to her other medical problems. She went to her PCP today and a carotid doppler was performed. She had significant carotid disease noted and was sent to the ED. While being evaluated in the ED the patient became poorly responsive with left eye deviation and right upper and lower extremity weakness. Code stroke was called at that time. last known well 01/15/2014, time unknown. Patient was not administered TPA secondary to delay in arrival. She was sent to intervention where she received IA integrelin as well as partial recanalization of R prox MCA, with R ICA stent placement. Post CT with hemorrhage vs contrast stain. she was loaded with aspirin and plavix.  She was admitted to the neuro ICU for further evaluation and treatment.  SUBJECTIVE Family at bedside. They are aware she had a bad night  OBJECTIVE Most recent Vital Signs: Filed Vitals:   01/21/14 0400 01/21/14 0500 01/21/14 0600 01/21/14 0700  BP: 123/48 129/55 167/86 151/68  Pulse: 109 89 130 98  Temp:      TempSrc:      Resp: 22 24 24 25   Height:      Weight:      SpO2: 99% 100% 100% 100%   CBG (last 3)   Recent Labs  01/20/14 2102 01/20/14 2345 01/21/14 0310  GLUCAP 134* 116* 121*    IV Fluid Intake:   . feeding supplement (VITAL AF 1.2 CAL) 1,000 mL (01/21/14 0600)  . niCARDipine Stopped (02/08/2014 2300)  . propofol Stopped (01/20/14 0800)  . sodium chloride (hypertonic) Stopped (01/21/14 0048)    MEDICATIONS  . antiseptic oral rinse  15 mL Mouth Rinse QID  . aspirin  300 mg Rectal Daily   Or  . aspirin  325 mg Oral Daily  . atorvastatin  80 mg Oral q1800  .  chlorhexidine  15 mL Mouth Rinse BID  . citalopram  40 mg Oral Daily  . clopidogrel  75 mg Oral Q breakfast  . enoxaparin (LOVENOX) injection  40 mg Subcutaneous Q24H  . ipratropium-albuterol  3 mL Nebulization Q6H  . pantoprazole sodium  40 mg Per Tube Q1200  . potassium chloride  30 mEq Per Tube Q4H   PRN:  acetaminophen, acetaminophen, albuterol, fentaNYL, ondansetron (ZOFRAN) IV, senna-docusate  Diet:  NPO  Activity:  Bedrest DVT Prophylaxis:  SCDs   CLINICALLY SIGNIFICANT STUDIES Basic Metabolic Panel:   Recent Labs Lab 01/20/14 0500  01/20/14 2258 01/21/14 0500  NA 151*  < > 158* 159*  K 3.4*  --   --  3.1*  CL 118*  --   --  123*  CO2 19  --   --  21  GLUCOSE 105*  --   --  140*  BUN 9  --   --  13  CREATININE 0.70  --   --  0.77  CALCIUM 7.2*  --   --  7.9*  < > = values in this interval not displayed. Liver Function Tests:   Recent Labs Lab 02/09/2014 1259  AST 16  ALT 13  ALKPHOS 91  BILITOT 0.4  PROT 6.4  ALBUMIN 3.5   CBC:   Recent  Labs Lab 02/01/2014 1259  01/19/14 0330 01/20/14 0500 01/21/14 0500  WBC 7.2  --  6.1 4.9 6.6  NEUTROABS 5.0  --  4.8  --   --   HGB 12.4  < > 9.3* 7.9* 7.6*  HCT 37.6  < > 27.4* 23.9* 23.7*  MCV 87.0  --  87.5 89.2 89.8  PLT 221  --  164 148* 162  < > = values in this interval not displayed. Coagulation:   Recent Labs Lab 02/04/2014 1259  LABPROT 13.2  INR 1.02   Cardiac Enzymes: No results found for this basename: CKTOTAL, CKMB, CKMBINDEX, TROPONINI,  in the last 168 hours Urinalysis:   Recent Labs Lab 01/21/2014 1345  COLORURINE YELLOW  LABSPEC 1.026  PHURINE 6.0  GLUCOSEU NEGATIVE  HGBUR NEGATIVE  BILIRUBINUR NEGATIVE  KETONESUR NEGATIVE  PROTEINUR NEGATIVE  UROBILINOGEN 0.2  NITRITE NEGATIVE  LEUKOCYTESUR SMALL*   Lipid Panel    Component Value Date/Time   CHOL 89 01/19/2014 0330   TRIG 264* 01/19/2014 0330   HDL 19* 01/19/2014 0330   CHOLHDL 4.7 01/19/2014 0330   VLDL 53* 01/19/2014 0330   LDLCALC  17 01/19/2014 0330   HgbA1C  Lab Results  Component Value Date   HGBA1C 5.4 01/19/2014    Urine Drug Screen:   No results found for this basename: labopia,  cocainscrnur,  labbenz,  amphetmu,  thcu,  labbarb    Alcohol Level: No results found for this basename: ETH,  in the last 168 hours   CT of the brain   01/21/2014 Evolving large left middle cerebral artery territory infarct with worsening edema/mass effect, 8 mm of left to right midline shift with early ventricular entrapment. No hemorrhagic conversion.  01/19/2014    Interval development of large territory acute infarct in the left MCA territory with edema and mass effect. No acute hemorrhage.   01/11/2014    Evidence of developing infarction in the left middle cerebral artery territory. No mass effect or shift at this time. Contrast staining in the insular brain and left caudate. Probable contrast staining within what likely represents residual thrombus in the left middle cerebral artery.    01/21/2014    Interval development of dense left middle cerebral artery/ left carotid terminus consistent with thrombus which may indicate impending large left middle cerebral artery distribution infarct.  No intracranial hemorrhage.    Cerebral Angiogram 01/17/2014 S/P bilateral common carotid arteriogram,followed by superselective infusion of 18 mg of IA Integrelin,and 3 passes with Solitaire FR retrieval device with minimal recanalization of occluded LT MCA prox and stent assisted angioplasty of symptomatic LT ICA prox stenosis.  MRI/MRA of the brain  Rectal stimulator precludes  2D Echocardiogram  EF 50-55% with no source of embolus.   Carotid Doppler  See angio  CXR   01/20/2014 Tubes and lines as described. Minimal left basilar atelectasis. 01/19/2014   Left internal jugular line terminating at low SVC; no pneumothorax.  Slight increase in left base Airspace disease, likely atelectasis.  Probable small left pleural effusion.   01/19/2014    1. Line and  tube position stable. 2. Bibasilar atelectasis and/or infiltrates. 3. Bronchiectasis in the right lung base cannot be excluded. 4. Cardiomegaly with mild pulmonary venous congestion.    02/04/2014    Endotracheal tube tip measures 3.1 cm above the carina. Borderline heart size. No evidence of consolidation or edema.     EKG  normal sinus rhythm. For complete results please see formal report.   Therapy Recommendations  Physical Exam   Elderly Caucasian lady intubated.    Afebrile. Head is nontraumatic. Neck is supple without bruit.  . Cardiac exam no murmur or gallop. Lungs are clear to auscultation. Distal pulses are well felt.. Neurological Exam : stuporose with eyes closed. Will not open eyes to sternal rub but will move left side purposefully against gravity. Left gaze preference. Will not move eyes to the right even with doll's eye movements. Pupils 3 mm irregular but reactive. Fundi could not be visualized. Does not blink to threat bilaterally. Right lower face weakness. Tongue midline. Dense right hemiplegia with mild withdrawal in the upper and lower extremity to pain and extensor posturing. Purposeful antigravity movements on the left side. Right plantar is upgoing and left is equivocal. Will not follow any commands  ASSESSMENT [name] is a 73 y.o. female who developed right facial numbness, blurred vision and expressive aphasia during a carotid doppler. She continued to worsen after arrival to the ED. She was taken to intervention where she received angioplasty of proximal LT ICA stenosis with eventual stent placement followed by IA integrilin and angioplasty of proximal LT ICA stenosis with minimal recanalization. Post procedure CT with left MCA infarct, contrast staining. Repeat CT with large L cortical and subcortical MCA infarct with cytotoxic cerebral edema and 8mm left to right midline shift. Started on 3% saline to decrease/prevent cerebral edema. Edema continued to progress  with midline shift now 94mm and developed pulmonary edema. Infarct felt to be thrombotic secondary to large vessel disease R ICA stenosis and R MCA stenosis.  On aspirin 81 mg orally every day prior to admission. Now on clopidogrel 75 mg orally every day and aspirin 300 mg suppository following plavix load post stent placement for secondary stroke prevention. Patient with resultant VDRF, right hemiplegia, nonverbal, global aphasia, with right sided reflex movements. Family have secured her living will and would like to pursue comfort care.  Acute respiratory failure, intubated for neuro intervention Acute blood loss anemia, Hgb 7.5->7.9, transfused 1u PRBCs Induced hypernatremia with 3% saline in order to decrease/reverese cerebral edema, Na 159  Hypertension Hyperlipidemia, LDL 17, on lipitor 80 mg daily PTA, now on no statin, goal LDL < 100  Tobacco abuse, quit 2009 CAD Takosubo cardiomyopahty Carotid artery disease, 40-59% bilat since 2010 Known dysphagia COPD Colonic AVM  depression  Hx PE, post op, treated with course of coumadin  RA  Low back pain, has rectal simulator due to nerve damage from back surgery. Is not compatible with MRI  Hospital day # 3  TREATMENT/PLAN  DNR  Continue current treatment, no new treatments   Continue clopidogrel 75 mg orally every day and aspirin 300 mg suppository for secondary stroke prevention.  Family is to call rest of family in. They will let RN know when they are ready to withdraw care  Discussed with Dr. Elsworth Soho and daughters and answered questions  SIGNED Burnetta Sabin, MSN, RN, ANVP-BC, ANP-BC, GNP-BC [facility] Pager: [phone] 01/21/2014 7:49 AM This patient is critically ill and at significant risk of neurological worsening, death and care requires constant monitoring of vital signs, hemodynamics,respiratory and cardiac monitoring,review of multiple databases, neurological assessment, discussion with family, other  specialists and medical decision making of high complexity. I spent 30 minutes of neurocritical care time  in the care of  this patient.  I have personally obtained a history, examined the patient, evaluated imaging results, and formulated the assessment and plan of care. I agree with the  above. Antony Contras, MD   To contact Stroke Continuity provider, please refer to [email or web address] After hours, contact General Neurology

## 2014-01-21 NOTE — Progress Notes (Signed)
PULMONARY / CRITICAL CARE MEDICINE  Name: [name] MRN: [id number] DOB: [birth year]    ADMISSION DATE:  01/11/2014 CONSULTATION DATE:    Loistine Chance MD :  Leonie Man PRIMARY SERVICE:  PCCM  CHIEF COMPLAINT:  CVA  BRIEF PATIENT DESCRIPTION: 73 y.o. F with hx of significant carotid artery stenosis, went to office for routine carotid dopplers then became flaccid on right with left facial gaze.  Sent to ED for further eval, found to have acute left MCA.  Taken to IR for angioplasty, returned to ICU on vent.  PCCM was consulted.  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 Hx of CAD and an improved Takotsubo CM (last EF 50%). Hx remote PE '88.    SIGNIFICANT EVENTS / STUDIES:  6/8 - outpatient appt for carotid dopplers, developed CVA symptoms, taken to ED and found to have acute left MCA stroke. 6/8 Head CT >>> impending large left MCA infarct >>> developing infarct in left MCA, no ME / MLS. 6/8 IR procedure >>> s/p bilateral common carotid arteriogram, infusion of integrelin, minimal recanalization of occluded left MCA and stent assisted angioplasty of symptomatic left ICA proximal stenosis. 6/8 echo >>>nml EF, no source of emboli 6/9 head CT >>>large territory acute infarct in the left MCA territory with edema and mass effect, 56mm shift 6/11 episode of hypoxia, frothy sputum, SVT -->lasix   LINES / TUBES: OETT 6/8 >>> OGT 6/8 >>> Foley 6/8 >>> Left radial A-line 6/8 >>>  CULTURES: None  ANTIBIOTICS: None    SUBJECTIVE:  Low grade fever persists Remains unresponsive episode of hypoxia, frothy sputum, SVT -->lasix   VITAL SIGNS: Temp:  [99.2 F (37.3 C)-100.8 F (38.2 C)] 100.4 F (38 C) (06/11 0311) Pulse Rate:  [67-131] 109 (06/11 0758) Resp:  [18-61] 25 (06/11 0758) BP: (107-173)/(42-99) 135/62 mmHg (06/11 0758) SpO2:  [98 %-100 %] 100 % (06/11 0758) Arterial Line BP: (98-167)/(59-91) 153/64  mmHg (06/10 1400) FiO2 (%):  [40 %-60 %] 60 % (06/11 0758) Weight:  [83.7 kg (184 lb 8.4 oz)] 83.7 kg (184 lb 8.4 oz) (06/11 0316)  HEMODYNAMICS:    VENTILATOR SETTINGS: Vent Mode:  [-] PRVC FiO2 (%):  [40 %-60 %] 60 % Set Rate:  [15 bmp] 15 bmp Vt Set:  [450 mL] 450 mL PEEP:  [5 cmH20] 5 cmH20 Pressure Support:  [5 cmH20-10 cmH20] 10 cmH20 Plateau Pressure:  [6 cmH20-12 cmH20] 6 cmH20  INTAKE / OUTPUT: Intake/Output     06/10 0701 - 06/11 0700 06/11 0701 - 06/12 0700   I.V. (mL/kg) 1058.8 (12.7)    NG/GT 1190    Total Intake(mL/kg) 2248.8 (26.9)    Urine (mL/kg/hr) 1615 (0.8)    Total Output 1615     Net +633.8            PHYSICAL EXAMINATION: General: in bed on vent, in NAD,brisk diuresis Neuro:  R hemiparesis, decerebrate on right, moves left to stimuli, plantar upgoing on right HEENT: [place]/AT. PERRL, sclerae anicteric. Cardiovascular: RRR, no M/R/G.  Lungs: Respirations even and unlabored.  CTA bilaterally, No W/R/R.   Abdomen: BS x 4, soft, NT/ND.  Musculoskeletal: No gross deformities, no edema.  Skin: Intact, warm, no rashes.    LABS: CBC  Recent Labs Lab 01/19/14 0330 01/20/14 0500 01/21/14 0500  WBC 6.1 4.9 6.6  HGB 9.3* 7.9* 7.6*  HCT 27.4* 23.9* 23.7*  PLT 164 148* 162   Coag's  Recent Labs Lab   1259  APTT 27  INR 1.02   BMET  Recent Labs Lab 01/19/14 0330  01/20/14 0500  01/20/14 1715 01/20/14 2258 01/21/14 0500  NA 141  < > 151*  < > 156* 158* 159*  K 3.5*  --  3.4*  --   --   --  3.1*  CL 107  --  118*  --   --   --  123*  CO2 19  --  19  --   --   --  21  BUN 9  --  9  --   --   --  13  CREATININE 0.78  --  0.70  --   --   --  0.77  GLUCOSE 113*  --  105*  --   --   --  140*  < > = values in this interval not displayed. Electrolytes  Recent Labs Lab 01/19/14 0330 01/20/14 0500 01/21/14 0500  CALCIUM 7.0* 7.2* 7.9*   Sepsis Markers No results found for this basename: LATICACIDVEN, PROCALCITON, O2SATVEN,  in  the last 168 hours ABG  Recent Labs Lab 01/25/2014 2318  PHART 7.305*  PCO2ART 42.6  PO2ART 411.0*   Liver Enzymes  Recent Labs Lab 02/06/2014 1259  AST 16  ALT 13  ALKPHOS 91  BILITOT 0.4  ALBUMIN 3.5   Cardiac Enzymes No results found for this basename: TROPONINI, PROBNP,  in the last 168 hours Glucose  Recent Labs Lab 01/20/14 1142 01/20/14 1610 01/20/14 2102 01/20/14 2345 01/21/14 0310 01/21/14 0733  GLUCAP 115* 119* 134* 116* 121* 133*    IMAGING: Ct Head Wo Contrast  01/21/2014   CLINICAL DATA:  Followup stroke.  EXAM: CT HEAD WITHOUT CONTRAST  TECHNIQUE: Contiguous axial images were obtained from the base of the skull through the vertex without intravenous contrast.  COMPARISON:  CT of the head January 19, 2014.  FINDINGS: Dense left middle cerebral artery again noted, with left frontotemporal parietal and increasing hypodensity, with 8 mm left-to-right midline shift, previously 6 mm varying worsening sulcal effacement on the left. Mild left uncal herniation. No hemorrhagic conversion. Left lateral ventricle is partially effaced with slight ballooning of the right ventricle atrium increased from prior examination consistent with early entrapment.  No skull fracture. Small left maxillary mucous retention cyst without paranasal sinus air-fluid levels. The mastoid air cells are well-aerated. No skull fracture. Ocular globes and orbital contents are nonsuspicious.  IMPRESSION: Evolving large left middle cerebral artery territory infarct with worsening edema/mass effect, 8 mm of left to right midline shift with early ventricular entrapment. No hemorrhagic conversion.   Electronically Signed   By: Elon Alas   On: 01/21/2014 06:00   Ct Head Wo Contrast  01/19/2014   CLINICAL DATA:  Stroke.  Post intervention.  EXAM: CT HEAD WITHOUT CONTRAST  TECHNIQUE: Contiguous axial images were obtained from the base of the skull through the vertex without intravenous contrast.  COMPARISON:   CT head 01/20/2014  FINDINGS: Interval development of large left MCA infarct. There is extensive low-density edema involving the left frontal, temporal, and parietal lobes. There is also hypodensity in the left basal ganglia involving the caudate and putamen consistent with acute infarct. There is diffuse edema and mass-effect with 6 mm midline shift to the right. Dense left MCA again noted due to thrombosis.  Negative for acute hemorrhage. Negative for hydrocephalus. No mass lesion.  IMPRESSION: Interval development of large territory acute infarct in the left MCA territory with edema and mass effect.  No acute hemorrhage.   Electronically Signed   By: Franchot Gallo M.D.   On: 01/19/2014 16:47   Dg Chest Port 1 View  01/21/2014   CLINICAL DATA:  Endotracheal tube position  EXAM: PORTABLE CHEST - 1 VIEW  COMPARISON:  01/20/2014  FINDINGS: Endotracheal tube in good position. Jugular catheter tip in the SVC unchanged. NG tube enters the stomach.  Progression of bibasilar airspace disease left greater than right. This may represent atelectasis or pneumonia. Probable small bilateral effusions.  IMPRESSION: Support lines remain in good position and unchanged.  Progression of bibasilar airspace disease, possible pneumonia.   Electronically Signed   By: Franchot Gallo M.D.   On: 01/21/2014 07:26   Dg Chest Port 1 View  01/20/2014   CLINICAL DATA:  Check endotracheal tube position  EXAM: PORTABLE CHEST - 1 VIEW  COMPARISON:  01/19/2014  FINDINGS: The cardiac shadow is stable. An endotracheal tube is again noted 3.7 cm above the carina. A left-sided jugular central line is noted with in the mid superior vena cava. Nasogastric catheter extends to the stomach. The lungs are well aerated bilaterally. Very minimal residual left basilar atelectasis is noted. No pneumothorax is seen.  IMPRESSION: Tubes and lines as described.  Minimal left basilar atelectasis.   Electronically Signed   By: Inez Catalina M.D.   On: 01/20/2014  07:29   Dg Chest Port 1 View  01/19/2014   CLINICAL DATA:  Central line placement.  CHF.  Hypertension.  EXAM: PORTABLE CHEST - 1 VIEW  COMPARISON:  Earlier today at 0532 hr.  FINDINGS: 1941 hr. Endotracheal tube terminates 3.0 cm above carina. Left internal jugular line terminates at the low SVC.  Nasogastric tube extends beyond the  inferior aspect of the film.  Cardiomegaly accentuated by AP portable technique. Possible small left pleural effusion. No pneumothorax. No congestive failure. Slight worsened left base airspace disease with persistent right base atelectasis.  IMPRESSION: Left internal jugular line terminating at low SVC; no pneumothorax.  Slight increase in left base Airspace disease, likely atelectasis.  Probable small left pleural effusion.  Cardiomegaly and low lung volumes.   Electronically Signed   By: Abigail Miyamoto M.D.   On: 01/19/2014 20:00   CXR: NACPD   ASSESSMENT / PLAN:  PULMONARY A:   VDRF -airway protection Hx ILD Hx Tobacco use disorder P:   - Goal SpO2>92. - VAP bundle. - DuoNebs / Albuterol. -Hold  SBTs - edema & mental status precludes extubation   CARDIOVASCULAR A:  Hx HTN Hx CAD Hx Takotsubo cardiomyopathy Hx carotid artery disease Transient hypotension Acute pulm edema  -triggered by 3% saline P:  - Hold home Toprol, Losartan,  Imdur. - restart as Bp rises - ASA/ Plavix per neuro. -lasix 40 q 12h   RENAL A:  Hypokalemia P:  replete - BMP in AM.  GASTROINTESTINAL A:   Hx GERD Hx IBS P:   -ctTF  - Continue Pantoprazole / Zantac.  HEMATOLOGIC A:   Anemia post procedure (Hgb 7.5)-s/p 1 unit PRBC P:   - VTE Proph:  SCD's.   INFECTIOUS A:  Fever ? aspiration P:   - send resp cx,  -empiric unasyn  ENDOCRINE  A:   No acute issues. P:   - Monitor glucose on BMP.  NEUROLOGIC A:   Acute CVA - s/p bilateral common carotid arteriogram, infusion of integrelin, minimal recanalization of occluded left MCA and stent assisted  angioplasty of symptomatic left ICA proximal stenosis. Acute encephalopathy P:   -  Sedation:  fent int +Propofol. - Goal RASS 0  - Daily WUA.   MUSCULOSKELETAL A: Rheumatoid Arthritis Hx myositis P: - Hold home leflunomide for now.  Summary - Mental status main barrier to extubation here  I have personally obtained history, examined patient, evaluated and interpreted laboratory and imaging results, reviewed medical records, formulated assessment / plan and placed orders.  CRITICAL CARE:  The patient is critically ill with multiple organ systems failure and requires high complexity decision making for assessment and support, frequent evaluation and titration of therapies, application of advanced monitoring technologies and extensive interpretation of multiple databases. Critical Care Time devoted to patient care services described in this note is 32 minutes.   Kara Mead MD. Shade Flood. [facility] Pager [phone] If no response call [phone]

## 2014-01-22 LAB — TYPE AND SCREEN
ABO/RH(D): O POS
Antibody Screen: NEGATIVE
UNIT DIVISION: 0
Unit division: 0

## 2014-01-22 MED ORDER — SODIUM CHLORIDE 0.9 % IV SOLN
10.0000 mg/h | INTRAVENOUS | Status: DC
  Administered 2014-01-22: 27.5 mg/h via INTRAVENOUS
  Filled 2014-01-22: qty 10

## 2014-01-23 LAB — CULTURE, RESPIRATORY W GRAM STAIN

## 2014-01-23 LAB — CULTURE, RESPIRATORY

## 2014-01-26 NOTE — Discharge Summary (Signed)
PULMONARY / CRITICAL CARE MEDICINE  Name: [name] MRN: [id number] DOB: 04-06-1941    ADMISSION DATE:  01/21/2014 CONSULTATION DATE:    Brenda Chance MD :  Leonie Man PRIMARY SERVICE:  PCCM  CHIEF COMPLAINT:  CVA  BRIEF PATIENT DESCRIPTION: 73 y.o. F with hx of significant carotid artery stenosis, went to office for routine carotid dopplers then became flaccid on right with left facial gaze.  Sent to ED for further eval, found to have acute left MCA.  Taken to IR for angioplasty, returned to ICU on vent.  PCCM was consulted.  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 Hx of CAD and an improved Takotsubo CM (last EF 50%). Hx remote PE '88.    SIGNIFICANT EVENTS / STUDIES:  6/8 - outpatient appt for carotid dopplers, developed CVA symptoms, taken to ED and found to have acute left MCA stroke. 6/8 Head CT >>> impending large left MCA infarct >>> developing infarct in left MCA, no ME / MLS. 6/8 IR procedure >>> s/p bilateral common carotid arteriogram, infusion of integrelin, minimal recanalization of occluded left MCA and stent assisted angioplasty of symptomatic left ICA proximal stenosis. 6/8 echo >>>nml EF, no source of emboli 6/9 head CT >>>large territory acute infarct in the left MCA territory with edema and mass effect, 21mm shift 02-01-2023 episode of hypoxia, frothy sputum, SVT -->lasix   LINES / TUBES: OETT 6/8 >>> OGT 6/8 >>> Foley 6/8 >>> Left radial A-line 6/8 >>>  COURSE :   PULMONARY A:   VDRF -airway protection Hx ILD Hx Tobacco use disorder   CARDIOVASCULAR A:  Hx HTN Hx CAD Hx Takotsubo cardiomyopathy Hx carotid artery disease Transient hypotension Acute pulm edema  -triggered by 3% saline P:  - Hold home Toprol, Losartan,  Imdur. - restart as Bp rises - ASA/ Plavix per neuro. -lasix 40 q 12h    HEMATOLOGIC A:   Anemia post procedure (Hgb 7.5)-s/p 1 unit PRBC    INFECTIOUS A:   Fever ? aspiration P:   -empiric unasyn  NEUROLOGIC A:   Acute CVA - s/p bilateral common carotid arteriogram, infusion of integrelin, minimal recanalization of occluded left MCA and stent assisted angioplasty of symptomatic left ICA proximal stenosis. Acute encephalopathy P:   - Sedation:  fent int +Propofol.   Summary - She was terminally extubated on 2023/02/01 & passed away soon after Cause of death - Acute CVA  Kara Mead MD. FCCP. [facility] Pager [phone] If no response call [phone]

## 2014-02-10 NOTE — Progress Notes (Signed)
95 mL Morphine wasted. Witnessed by Katherene Ponto R.N. Thayer Ohm D

## 2014-02-10 NOTE — Progress Notes (Signed)
Patient went asystole at [facility] on Jan 28, 2014. Patient was a DNR. Verified by two nurses, Thayer Ohm R.N. & Fenton Malling R.N. Family present with patient. Dr. Nicole Kindred notified. Thayer Ohm D

## 2014-02-10 DEATH — deceased

## 2014-07-22 ENCOUNTER — Encounter (HOSPITAL_COMMUNITY): Payer: Self-pay | Admitting: Cardiology

## 2014-08-26 ENCOUNTER — Encounter (HOSPITAL_COMMUNITY): Payer: Self-pay | Admitting: Radiology

## 2014-10-05 IMAGING — CT CT HEAD W/O CM
1 series · 16 of 29 positions shown, 20 images · non-contrast
Comparison: None.

CLINICAL DATA: Unresponsive.  Right-sided is flaccid.

EXAM:
CT HEAD WITHOUT CONTRAST
TECHNIQUE: Contiguous axial images were obtained from the base of the skull
through the vertex without intravenous contrast.

[Series 2: head 5.0 h30s · axial · 0.41mm/px · z∈[-125,+5]mm · 16 of 29 slices shown, 20 images]
[im 2/29  brain]
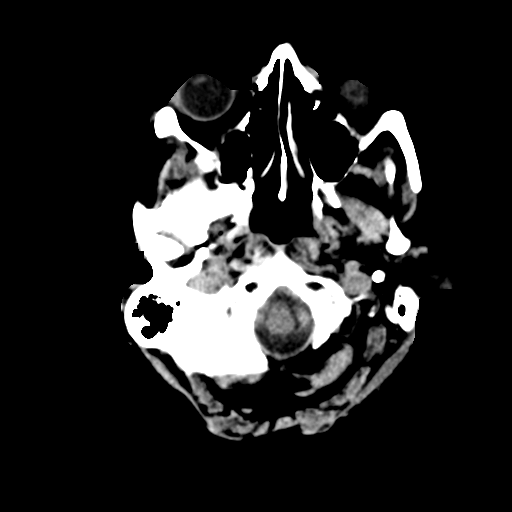
[im 2/29  bone]
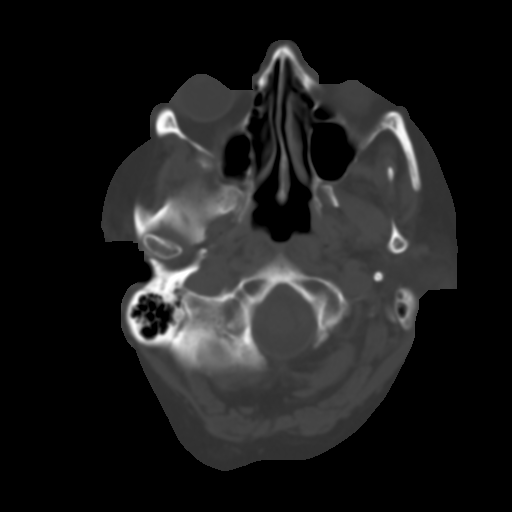
[im 4/29  brain]
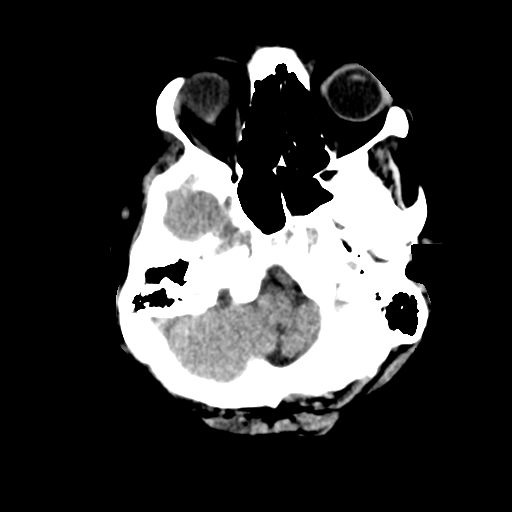
[im 6/29  brain]
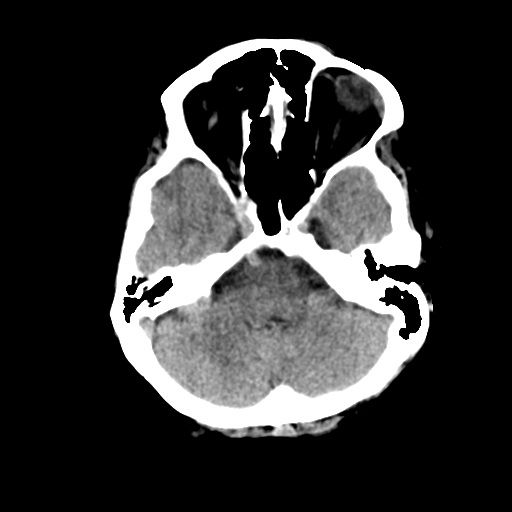
[im 7/29  brain]
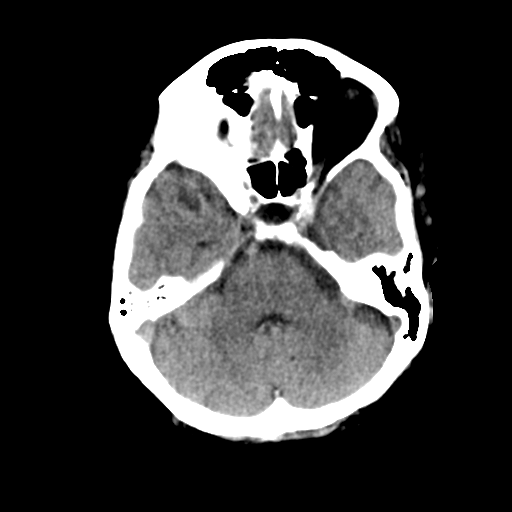
[im 9/29  brain]
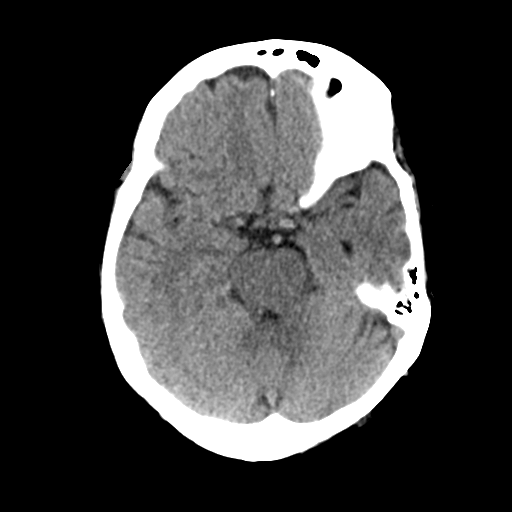
[im 9/29  bone]
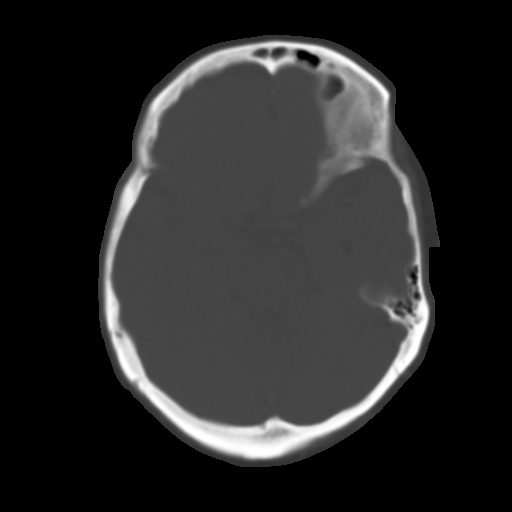
[im 11/29  brain]
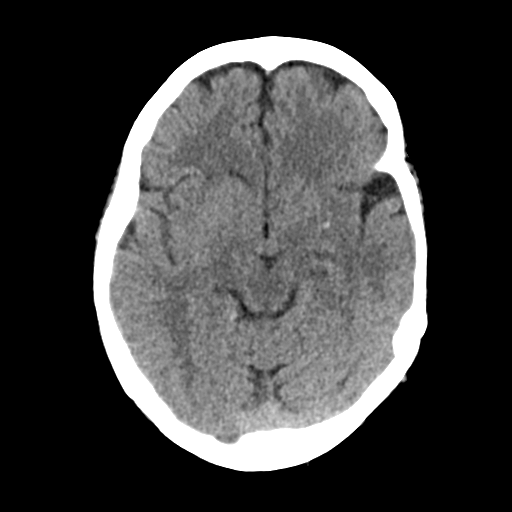
[im 12/29  brain]
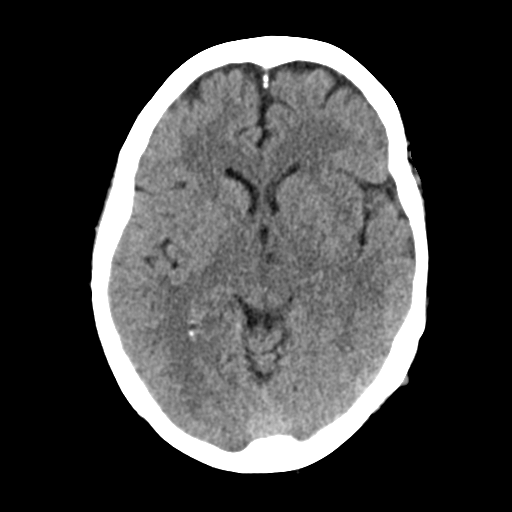
[im 14/29  brain]
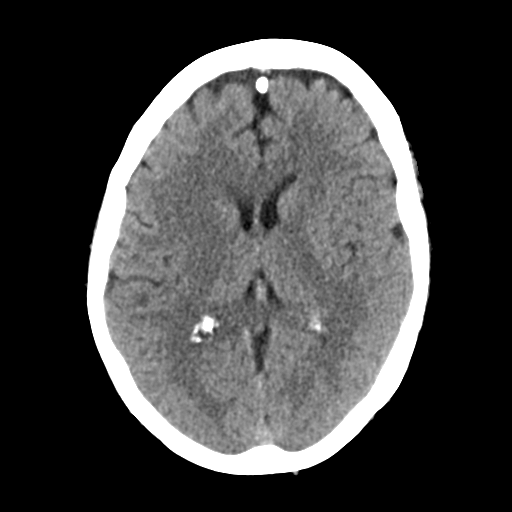
[im 16/29  brain]
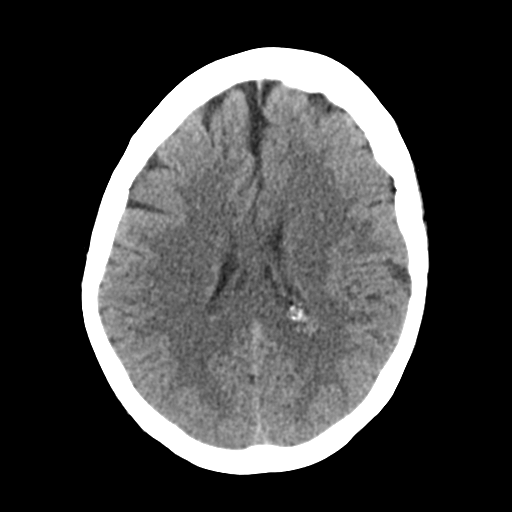
[im 16/29  bone]
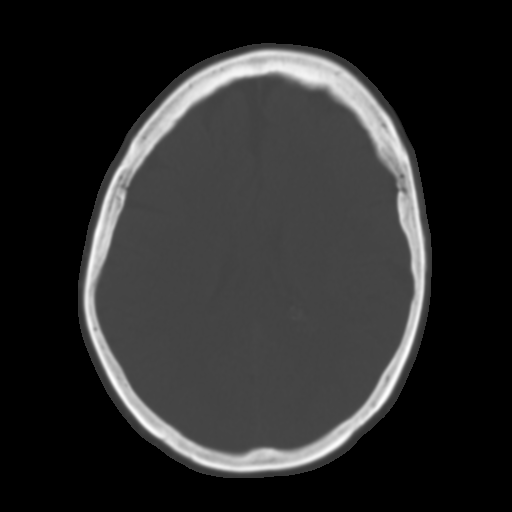
[im 18/29  brain]
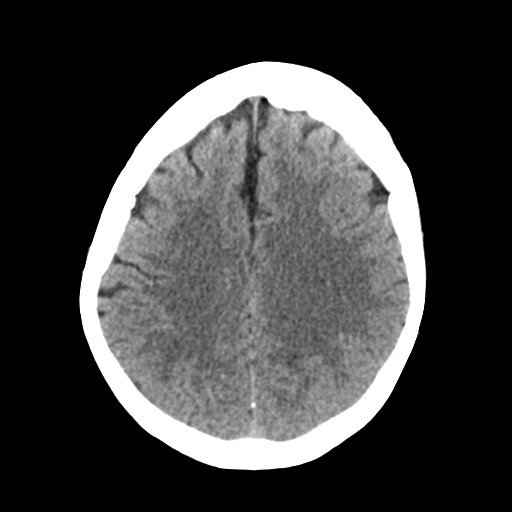
[im 19/29  brain]
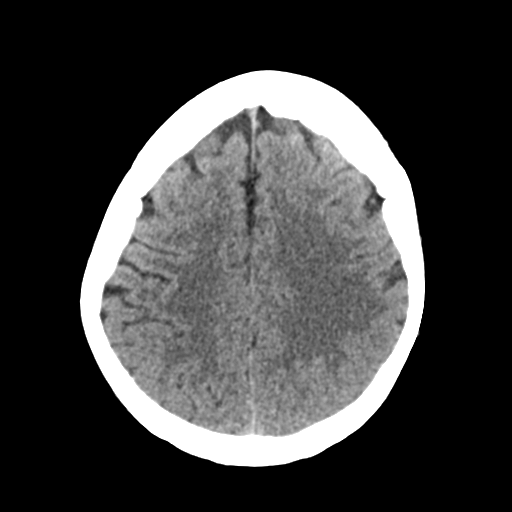
[im 21/29  brain]
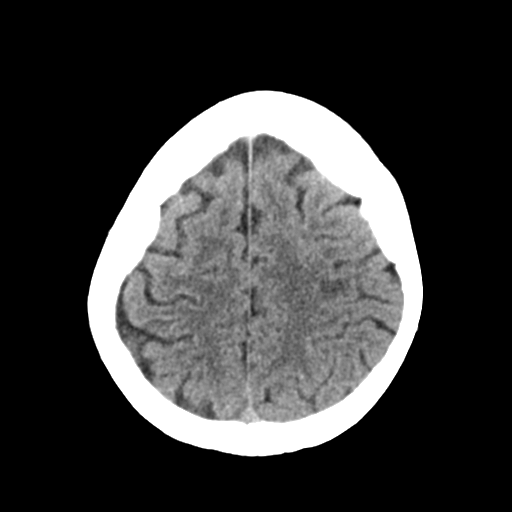
[im 23/29  brain]
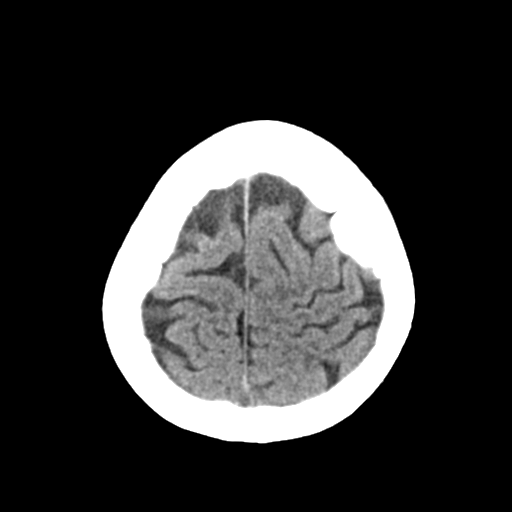
[im 23/29  bone]
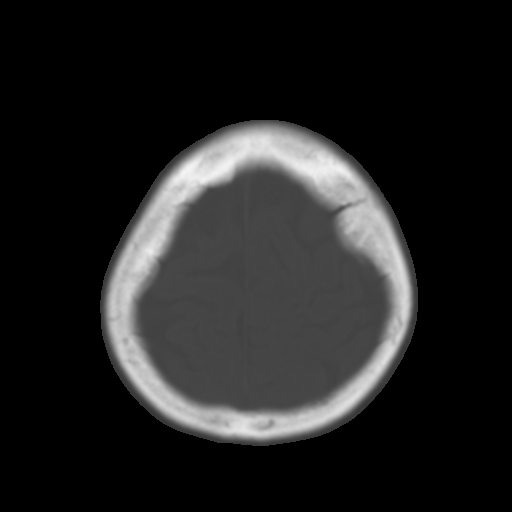
[im 24/29  brain]
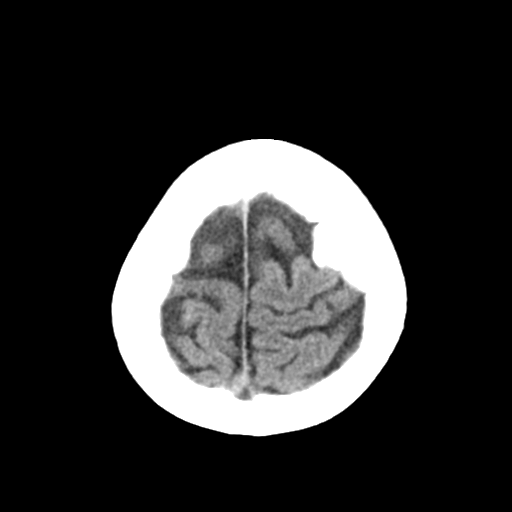
[im 26/29  brain]
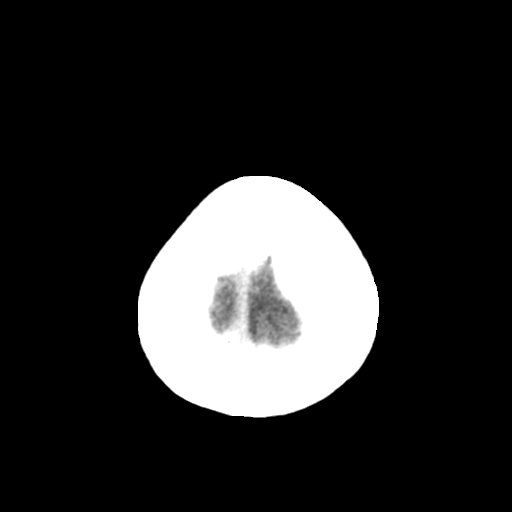
[im 28/29  brain]
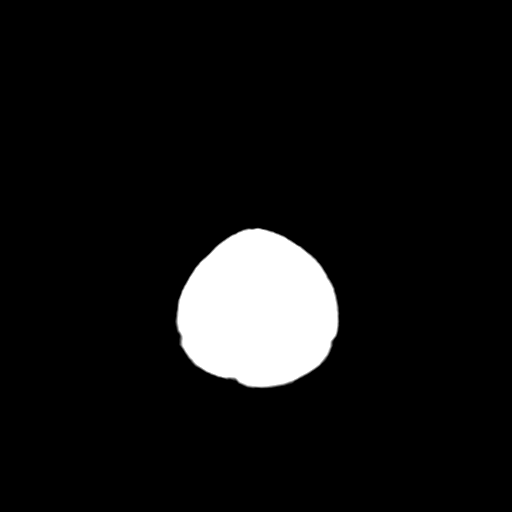

[16 of 29 positions shown; findings below may reference images not displayed]

FINDINGS: Interval development of dense left middle cerebral artery/ left
carotid terminus consistent with thrombus which may indicate
impending large left middle cerebral artery distribution infarct.

No intracranial hemorrhage.

No intracranial mass lesion noted on this unenhanced exam.

No hydrocephalus.
IMPRESSION: Interval development of dense left middle cerebral artery/ left
carotid terminus consistent with thrombus which may indicate
impending large left middle cerebral artery distribution infarct.

No intracranial hemorrhage.

These results were called by telephone at the time of interpretation
on 01/18/2014 at [DATE] to Dr. Tiger who verbally acknowledged
these results.

## 2015-08-25 ENCOUNTER — Encounter: Payer: Self-pay | Admitting: Gastroenterology
